# Patient Record
Sex: Female | Born: 1935
Health system: Southern US, Community
[De-identification: ages and names within clinical notes are randomized; demographics above are authoritative.]

## PROBLEM LIST (undated history)

## (undated) DIAGNOSIS — I10 Essential (primary) hypertension: Secondary | ICD-10-CM

## (undated) DIAGNOSIS — Z803 Family history of malignant neoplasm of breast: Secondary | ICD-10-CM

## (undated) DIAGNOSIS — A0472 Enterocolitis due to Clostridium difficile, not specified as recurrent: Secondary | ICD-10-CM

## (undated) DIAGNOSIS — C801 Malignant (primary) neoplasm, unspecified: Secondary | ICD-10-CM

## (undated) DIAGNOSIS — K5792 Diverticulitis of intestine, part unspecified, without perforation or abscess without bleeding: Secondary | ICD-10-CM

## (undated) DIAGNOSIS — Z5189 Encounter for other specified aftercare: Secondary | ICD-10-CM

## (undated) DIAGNOSIS — D649 Anemia, unspecified: Secondary | ICD-10-CM

## (undated) DIAGNOSIS — Z8052 Family history of malignant neoplasm of bladder: Secondary | ICD-10-CM

## (undated) DIAGNOSIS — E785 Hyperlipidemia, unspecified: Secondary | ICD-10-CM

## (undated) DIAGNOSIS — N390 Urinary tract infection, site not specified: Secondary | ICD-10-CM

## (undated) DIAGNOSIS — Z800 Family history of malignant neoplasm of digestive organs: Secondary | ICD-10-CM

## (undated) HISTORY — DX: Essential (primary) hypertension: I10

## (undated) HISTORY — DX: Diverticulitis of intestine, part unspecified, without perforation or abscess without bleeding: K57.92

## (undated) HISTORY — PX: BLADDER SUSPENSION: SHX72

## (undated) HISTORY — DX: Malignant (primary) neoplasm, unspecified: C80.1

## (undated) HISTORY — DX: Enterocolitis due to Clostridium difficile, not specified as recurrent: A04.72

## (undated) HISTORY — DX: Family history of malignant neoplasm of breast: Z80.3

## (undated) HISTORY — DX: Hyperlipidemia, unspecified: E78.5

## (undated) HISTORY — DX: Family history of malignant neoplasm of digestive organs: Z80.0

## (undated) HISTORY — DX: Anemia, unspecified: D64.9

## (undated) HISTORY — DX: Encounter for other specified aftercare: Z51.89

## (undated) HISTORY — DX: Urinary tract infection, site not specified: N39.0

## (undated) HISTORY — PX: ABDOMINAL HYSTERECTOMY: SHX81

## (undated) HISTORY — DX: Family history of malignant neoplasm of bladder: Z80.52

## (undated) HISTORY — PX: CATARACT EXTRACTION: SUR2

## (undated) HISTORY — PX: COLON SURGERY: SHX602

---

## 1999-05-31 ENCOUNTER — Other Ambulatory Visit: Admission: RE | Admit: 1999-05-31 | Discharge: 1999-05-31 | Payer: Self-pay | Admitting: Obstetrics and Gynecology

## 2000-08-14 ENCOUNTER — Other Ambulatory Visit: Admission: RE | Admit: 2000-08-14 | Discharge: 2000-08-14 | Payer: Self-pay | Admitting: Obstetrics and Gynecology

## 2002-02-07 ENCOUNTER — Encounter: Payer: Self-pay | Admitting: Urology

## 2002-02-07 ENCOUNTER — Encounter: Admission: RE | Admit: 2002-02-07 | Discharge: 2002-02-07 | Payer: Self-pay | Admitting: Urology

## 2002-02-12 ENCOUNTER — Ambulatory Visit (HOSPITAL_BASED_OUTPATIENT_CLINIC_OR_DEPARTMENT_OTHER): Admission: RE | Admit: 2002-02-12 | Discharge: 2002-02-12 | Payer: Self-pay | Admitting: Urology

## 2002-03-11 ENCOUNTER — Encounter: Payer: Self-pay | Admitting: Obstetrics and Gynecology

## 2002-03-11 ENCOUNTER — Encounter: Admission: RE | Admit: 2002-03-11 | Discharge: 2002-03-11 | Payer: Self-pay | Admitting: Obstetrics and Gynecology

## 2004-11-02 HISTORY — PX: CARDIOVASCULAR STRESS TEST: SHX262

## 2008-02-29 HISTORY — PX: COLON SURGERY: SHX602

## 2008-05-21 ENCOUNTER — Encounter: Admission: RE | Admit: 2008-05-21 | Discharge: 2008-05-21 | Payer: Self-pay | Admitting: Gastroenterology

## 2008-05-27 ENCOUNTER — Encounter (INDEPENDENT_AMBULATORY_CARE_PROVIDER_SITE_OTHER): Payer: Self-pay | Admitting: General Surgery

## 2008-05-27 ENCOUNTER — Inpatient Hospital Stay (HOSPITAL_COMMUNITY): Admission: RE | Admit: 2008-05-27 | Discharge: 2008-06-23 | Payer: Self-pay | Admitting: General Surgery

## 2008-05-27 DIAGNOSIS — Z85038 Personal history of other malignant neoplasm of large intestine: Secondary | ICD-10-CM | POA: Insufficient documentation

## 2008-07-03 ENCOUNTER — Ambulatory Visit: Payer: Self-pay | Admitting: Oncology

## 2008-07-11 LAB — CBC WITH DIFFERENTIAL/PLATELET
BASO%: 0.4 % (ref 0.0–2.0)
Eosinophils Absolute: 0.1 10*3/uL (ref 0.0–0.5)
HCT: 37.7 % (ref 34.8–46.6)
HGB: 12.7 g/dL (ref 11.6–15.9)
MCHC: 33.7 g/dL (ref 31.5–36.0)
MONO#: 0.4 10*3/uL (ref 0.1–0.9)
NEUT#: 3.3 10*3/uL (ref 1.5–6.5)
NEUT%: 56.4 % (ref 38.4–76.8)
WBC: 5.8 10*3/uL (ref 3.9–10.3)
lymph#: 2 10*3/uL (ref 0.9–3.3)

## 2008-08-18 ENCOUNTER — Encounter: Admission: RE | Admit: 2008-08-18 | Discharge: 2008-08-18 | Payer: Self-pay | Admitting: General Surgery

## 2009-01-09 ENCOUNTER — Ambulatory Visit: Payer: Self-pay | Admitting: Oncology

## 2009-01-13 LAB — CEA: CEA: 0.5 ng/mL (ref 0.0–5.0)

## 2009-09-08 ENCOUNTER — Ambulatory Visit: Payer: Self-pay | Admitting: Oncology

## 2009-09-10 LAB — CEA: CEA: 1.2 ng/mL (ref 0.0–5.0)

## 2009-10-05 ENCOUNTER — Ambulatory Visit: Payer: Self-pay | Admitting: Cardiology

## 2010-01-20 ENCOUNTER — Ambulatory Visit: Payer: Self-pay | Admitting: Oncology

## 2010-01-25 LAB — CEA: CEA: <0.5 ng/mL (ref 0.0–5.0)

## 2010-03-31 ENCOUNTER — Ambulatory Visit (INDEPENDENT_AMBULATORY_CARE_PROVIDER_SITE_OTHER): Payer: Medicare Other | Admitting: Cardiology

## 2010-03-31 DIAGNOSIS — E7800 Pure hypercholesterolemia, unspecified: Secondary | ICD-10-CM

## 2010-03-31 DIAGNOSIS — Z79899 Other long term (current) drug therapy: Secondary | ICD-10-CM

## 2010-03-31 DIAGNOSIS — R5381 Other malaise: Secondary | ICD-10-CM

## 2010-03-31 DIAGNOSIS — I119 Hypertensive heart disease without heart failure: Secondary | ICD-10-CM

## 2010-06-09 LAB — BASIC METABOLIC PANEL
BUN: 10 mg/dL (ref 6–23)
BUN: 13 mg/dL (ref 6–23)
BUN: 14 mg/dL (ref 6–23)
BUN: 17 mg/dL (ref 6–23)
BUN: 11 mg/dL (ref 6–23)
BUN: 12 mg/dL (ref 6–23)
BUN: 3 mg/dL — ABNORMAL LOW (ref 6–23)
BUN: 3 mg/dL — ABNORMAL LOW (ref 6–23)
BUN: 4 mg/dL — ABNORMAL LOW (ref 6–23)
CO2: 25 mEq/L (ref 19–32)
CO2: 26 mEq/L (ref 19–32)
CO2: 26 mEq/L (ref 19–32)
CO2: 26 mEq/L (ref 19–32)
CO2: 28 mEq/L (ref 19–32)
Calcium: 8.1 mg/dL — ABNORMAL LOW (ref 8.4–10.5)
Calcium: 8.2 mg/dL — ABNORMAL LOW (ref 8.4–10.5)
Calcium: 8.4 mg/dL (ref 8.4–10.5)
Calcium: 8.4 mg/dL (ref 8.4–10.5)
Calcium: 8.5 mg/dL (ref 8.4–10.5)
Calcium: 8.5 mg/dL (ref 8.4–10.5)
Calcium: 8 mg/dL — ABNORMAL LOW (ref 8.4–10.5)
Calcium: 8 mg/dL — ABNORMAL LOW (ref 8.4–10.5)
Calcium: 8.6 mg/dL (ref 8.4–10.5)
Calcium: 8.8 mg/dL (ref 8.4–10.5)
Chloride: 101 mEq/L (ref 96–112)
Chloride: 105 mEq/L (ref 96–112)
Chloride: 100 mEq/L (ref 96–112)
Chloride: 100 mEq/L (ref 96–112)
Chloride: 100 mEq/L (ref 96–112)
Chloride: 102 mEq/L (ref 96–112)
Creatinine, Ser: 0.55 mg/dL (ref 0.4–1.2)
Creatinine, Ser: 0.56 mg/dL (ref 0.4–1.2)
Creatinine, Ser: 0.6 mg/dL (ref 0.4–1.2)
Creatinine, Ser: 0.64 mg/dL (ref 0.4–1.2)
Creatinine, Ser: 0.56 mg/dL (ref 0.4–1.2)
Creatinine, Ser: 0.57 mg/dL (ref 0.4–1.2)
Creatinine, Ser: 0.57 mg/dL (ref 0.4–1.2)
Creatinine, Ser: 0.6 mg/dL (ref 0.4–1.2)
Creatinine, Ser: 0.65 mg/dL (ref 0.4–1.2)
GFR calc Af Amer: >60 mL/min (ref >60)
GFR calc Af Amer: >60 mL/min (ref >60)
GFR calc Af Amer: >60 mL/min (ref >60)
GFR calc Af Amer: >60 mL/min (ref >60)
GFR calc Af Amer: >60 mL/min (ref >60)
GFR calc Af Amer: >60 mL/min (ref >60)
GFR calc Af Amer: >60 mL/min (ref >60)
GFR calc Af Amer: >60 mL/min (ref >60)
GFR calc Af Amer: >60 mL/min (ref >60)
GFR calc non Af Amer: >60 mL/min (ref >60)
GFR calc non Af Amer: >60 mL/min (ref >60)
GFR calc non Af Amer: >60 mL/min (ref >60)
GFR calc non Af Amer: >60 mL/min (ref >60)
GFR calc non Af Amer: >60 mL/min (ref >60)
GFR calc non Af Amer: >60 mL/min (ref >60)
Glucose, Bld: 109 mg/dL — ABNORMAL HIGH (ref 70–99)
Glucose, Bld: 109 mg/dL — ABNORMAL HIGH (ref 70–99)
Glucose, Bld: 125 mg/dL — ABNORMAL HIGH (ref 70–99)
Glucose, Bld: 84 mg/dL (ref 70–99)
Glucose, Bld: 135 mg/dL — ABNORMAL HIGH (ref 70–99)
Glucose, Bld: 98 mg/dL (ref 70–99)
Potassium: 4 mEq/L (ref 3.5–5.1)
Potassium: 3.7 mEq/L (ref 3.5–5.1)
Sodium: 133 mEq/L — ABNORMAL LOW (ref 135–145)
Sodium: 134 mEq/L — ABNORMAL LOW (ref 135–145)
Sodium: 135 mEq/L (ref 135–145)
Sodium: 134 mEq/L — ABNORMAL LOW (ref 135–145)
Sodium: 136 mEq/L (ref 135–145)
Sodium: 136 mEq/L (ref 135–145)

## 2010-06-09 LAB — CBC
HCT: 26.6 % — ABNORMAL LOW (ref 36.0–46.0)
HCT: 27.5 % — ABNORMAL LOW (ref 36.0–46.0)
HCT: 31.8 % — ABNORMAL LOW (ref 36.0–46.0)
HCT: 37.9 % (ref 36.0–46.0)
HCT: 38.5 % (ref 36.0–46.0)
Hemoglobin: 9.2 g/dL — ABNORMAL LOW (ref 12.0–15.0)
Hemoglobin: 9.4 g/dL — ABNORMAL LOW (ref 12.0–15.0)
Hemoglobin: 11.1 g/dL — ABNORMAL LOW (ref 12.0–15.0)
Hemoglobin: 12.3 g/dL (ref 12.0–15.0)
Hemoglobin: 8 g/dL — ABNORMAL LOW (ref 12.0–15.0)
MCHC: 33.5 g/dL (ref 30.0–36.0)
MCHC: 33.6 g/dL (ref 30.0–36.0)
MCHC: 34.2 g/dL (ref 30.0–36.0)
MCHC: 34.4 g/dL (ref 30.0–36.0)
MCHC: 34.6 g/dL (ref 30.0–36.0)
MCHC: 33 g/dL (ref 30.0–36.0)
MCHC: 33.1 g/dL (ref 30.0–36.0)
MCHC: 33.3 g/dL (ref 30.0–36.0)
MCHC: 34 g/dL (ref 30.0–36.0)
MCV: 79.6 fL (ref 78.0–100.0)
MCV: 79.8 fL (ref 78.0–100.0)
MCV: 80 fL (ref 78.0–100.0)
MCV: 80.6 fL (ref 78.0–100.0)
MCV: 72.5 fL — ABNORMAL LOW (ref 78.0–100.0)
MCV: 76.8 fL — ABNORMAL LOW (ref 78.0–100.0)
MCV: 77.2 fL — ABNORMAL LOW (ref 78.0–100.0)
MCV: 78.2 fL (ref 78.0–100.0)
Platelets: 275 10*3/uL (ref 150–400)
Platelets: 283 10*3/uL (ref 150–400)
Platelets: 297 10*3/uL (ref 150–400)
Platelets: 316 10*3/uL (ref 150–400)
Platelets: 209 10*3/uL (ref 150–400)
Platelets: 240 10*3/uL (ref 150–400)
Platelets: 298 10*3/uL (ref 150–400)
Platelets: 321 10*3/uL (ref 150–400)
Platelets: 334 10*3/uL (ref 150–400)
Platelets: 339 10*3/uL (ref 150–400)
Platelets: 377 10*3/uL (ref 150–400)
RBC: 3.23 MIL/uL — ABNORMAL LOW (ref 3.87–5.11)
RBC: 3.53 MIL/uL — ABNORMAL LOW (ref 3.87–5.11)
RBC: 3.78 MIL/uL — ABNORMAL LOW (ref 3.87–5.11)
RBC: 3.07 MIL/uL — ABNORMAL LOW (ref 3.87–5.11)
RBC: 3.13 MIL/uL — ABNORMAL LOW (ref 3.87–5.11)
RBC: 4.36 MIL/uL (ref 3.87–5.11)
RBC: 4.6 MIL/uL (ref 3.87–5.11)
RBC: 4.91 MIL/uL (ref 3.87–5.11)
RDW: 24.4 % — ABNORMAL HIGH (ref 11.5–15.5)
RDW: 25 % — ABNORMAL HIGH (ref 11.5–15.5)
RDW: 25 % — ABNORMAL HIGH (ref 11.5–15.5)
RDW: 25 % — ABNORMAL HIGH (ref 11.5–15.5)
RDW: 25.3 % — ABNORMAL HIGH (ref 11.5–15.5)
RDW: 24.4 % — ABNORMAL HIGH (ref 11.5–15.5)
RDW: 25.2 % — ABNORMAL HIGH (ref 11.5–15.5)
WBC: 10.9 10*3/uL — ABNORMAL HIGH (ref 4.0–10.5)
WBC: 7.2 10*3/uL (ref 4.0–10.5)
WBC: 7.7 10*3/uL (ref 4.0–10.5)
WBC: 13.3 10*3/uL — ABNORMAL HIGH (ref 4.0–10.5)
WBC: 5.3 10*3/uL (ref 4.0–10.5)
WBC: 6.7 10*3/uL (ref 4.0–10.5)
WBC: 6.9 10*3/uL (ref 4.0–10.5)
WBC: 7 10*3/uL (ref 4.0–10.5)
WBC: 7.1 10*3/uL (ref 4.0–10.5)
WBC: 7.2 10*3/uL (ref 4.0–10.5)
WBC: 7.4 10*3/uL (ref 4.0–10.5)

## 2010-06-09 LAB — COMPREHENSIVE METABOLIC PANEL
ALT: 14 U/L (ref 0–35)
ALT: 14 U/L (ref 0–35)
ALT: 9 U/L (ref 0–35)
AST: 17 U/L (ref 0–37)
AST: 17 U/L (ref 0–37)
AST: 19 U/L (ref 0–37)
AST: 9 U/L (ref 0–37)
Albumin: 2.1 g/dL — ABNORMAL LOW (ref 3.5–5.2)
Albumin: 2.3 g/dL — ABNORMAL LOW (ref 3.5–5.2)
Albumin: 2.5 g/dL — ABNORMAL LOW (ref 3.5–5.2)
Albumin: 1.6 g/dL — ABNORMAL LOW (ref 3.5–5.2)
Albumin: 2.4 g/dL — ABNORMAL LOW (ref 3.5–5.2)
Alkaline Phosphatase: 126 U/L — ABNORMAL HIGH (ref 39–117)
Alkaline Phosphatase: 61 U/L (ref 39–117)
BUN: 12 mg/dL (ref 6–23)
BUN: 14 mg/dL (ref 6–23)
CO2: 23 mEq/L (ref 19–32)
Calcium: 8.3 mg/dL — ABNORMAL LOW (ref 8.4–10.5)
Calcium: 8.5 mg/dL (ref 8.4–10.5)
Calcium: 8.1 mg/dL — ABNORMAL LOW (ref 8.4–10.5)
Calcium: 8.6 mg/dL (ref 8.4–10.5)
Calcium: 8.6 mg/dL (ref 8.4–10.5)
Chloride: 104 mEq/L (ref 96–112)
Chloride: 106 mEq/L (ref 96–112)
Creatinine, Ser: 0.55 mg/dL (ref 0.4–1.2)
Creatinine, Ser: 0.59 mg/dL (ref 0.4–1.2)
Creatinine, Ser: 0.47 mg/dL (ref 0.4–1.2)
Creatinine, Ser: 0.51 mg/dL (ref 0.4–1.2)
GFR calc Af Amer: >60 mL/min (ref >60)
GFR calc Af Amer: >60 mL/min (ref >60)
GFR calc Af Amer: >60 mL/min (ref >60)
GFR calc Af Amer: >60 mL/min (ref >60)
GFR calc non Af Amer: >60 mL/min (ref >60)
Glucose, Bld: 109 mg/dL — ABNORMAL HIGH (ref 70–99)
Glucose, Bld: 116 mg/dL — ABNORMAL HIGH (ref 70–99)
Glucose, Bld: 118 mg/dL — ABNORMAL HIGH (ref 70–99)
Potassium: 3.9 mEq/L (ref 3.5–5.1)
Potassium: 4.1 mEq/L (ref 3.5–5.1)
Sodium: 133 mEq/L — ABNORMAL LOW (ref 135–145)
Sodium: 133 mEq/L — ABNORMAL LOW (ref 135–145)
Sodium: 137 mEq/L (ref 135–145)
Total Bilirubin: 0.4 mg/dL (ref 0.3–1.2)
Total Protein: 5.3 g/dL — ABNORMAL LOW (ref 6.0–8.3)
Total Protein: 5.3 g/dL — ABNORMAL LOW (ref 6.0–8.3)
Total Protein: 4.5 g/dL — ABNORMAL LOW (ref 6.0–8.3)
Total Protein: 4.7 g/dL — ABNORMAL LOW (ref 6.0–8.3)

## 2010-06-09 LAB — GLUCOSE, CAPILLARY
Glucose-Capillary: 103 mg/dL — ABNORMAL HIGH (ref 70–99)
Glucose-Capillary: 105 mg/dL — ABNORMAL HIGH (ref 70–99)
Glucose-Capillary: 108 mg/dL — ABNORMAL HIGH (ref 70–99)
Glucose-Capillary: 109 mg/dL — ABNORMAL HIGH (ref 70–99)
Glucose-Capillary: 111 mg/dL — ABNORMAL HIGH (ref 70–99)
Glucose-Capillary: 113 mg/dL — ABNORMAL HIGH (ref 70–99)
Glucose-Capillary: 115 mg/dL — ABNORMAL HIGH (ref 70–99)
Glucose-Capillary: 115 mg/dL — ABNORMAL HIGH (ref 70–99)
Glucose-Capillary: 117 mg/dL — ABNORMAL HIGH (ref 70–99)
Glucose-Capillary: 118 mg/dL — ABNORMAL HIGH (ref 70–99)
Glucose-Capillary: 120 mg/dL — ABNORMAL HIGH (ref 70–99)
Glucose-Capillary: 123 mg/dL — ABNORMAL HIGH (ref 70–99)
Glucose-Capillary: 126 mg/dL — ABNORMAL HIGH (ref 70–99)
Glucose-Capillary: 127 mg/dL — ABNORMAL HIGH (ref 70–99)
Glucose-Capillary: 130 mg/dL — ABNORMAL HIGH (ref 70–99)
Glucose-Capillary: 130 mg/dL — ABNORMAL HIGH (ref 70–99)
Glucose-Capillary: 130 mg/dL — ABNORMAL HIGH (ref 70–99)
Glucose-Capillary: 131 mg/dL — ABNORMAL HIGH (ref 70–99)
Glucose-Capillary: 132 mg/dL — ABNORMAL HIGH (ref 70–99)
Glucose-Capillary: 132 mg/dL — ABNORMAL HIGH (ref 70–99)
Glucose-Capillary: 134 mg/dL — ABNORMAL HIGH (ref 70–99)
Glucose-Capillary: 136 mg/dL — ABNORMAL HIGH (ref 70–99)
Glucose-Capillary: 138 mg/dL — ABNORMAL HIGH (ref 70–99)
Glucose-Capillary: 139 mg/dL — ABNORMAL HIGH (ref 70–99)
Glucose-Capillary: 141 mg/dL — ABNORMAL HIGH (ref 70–99)
Glucose-Capillary: 155 mg/dL — ABNORMAL HIGH (ref 70–99)
Glucose-Capillary: 112 mg/dL — ABNORMAL HIGH (ref 70–99)
Glucose-Capillary: 113 mg/dL — ABNORMAL HIGH (ref 70–99)
Glucose-Capillary: 113 mg/dL — ABNORMAL HIGH (ref 70–99)
Glucose-Capillary: 118 mg/dL — ABNORMAL HIGH (ref 70–99)
Glucose-Capillary: 119 mg/dL — ABNORMAL HIGH (ref 70–99)
Glucose-Capillary: 119 mg/dL — ABNORMAL HIGH (ref 70–99)
Glucose-Capillary: 121 mg/dL — ABNORMAL HIGH (ref 70–99)
Glucose-Capillary: 124 mg/dL — ABNORMAL HIGH (ref 70–99)
Glucose-Capillary: 131 mg/dL — ABNORMAL HIGH (ref 70–99)
Glucose-Capillary: 131 mg/dL — ABNORMAL HIGH (ref 70–99)
Glucose-Capillary: 137 mg/dL — ABNORMAL HIGH (ref 70–99)
Glucose-Capillary: 138 mg/dL — ABNORMAL HIGH (ref 70–99)
Glucose-Capillary: 145 mg/dL — ABNORMAL HIGH (ref 70–99)
Glucose-Capillary: 150 mg/dL — ABNORMAL HIGH (ref 70–99)
Glucose-Capillary: 155 mg/dL — ABNORMAL HIGH (ref 70–99)
Glucose-Capillary: 157 mg/dL — ABNORMAL HIGH (ref 70–99)
Glucose-Capillary: 165 mg/dL — ABNORMAL HIGH (ref 70–99)
Glucose-Capillary: 172 mg/dL — ABNORMAL HIGH (ref 70–99)
Glucose-Capillary: 220 mg/dL — ABNORMAL HIGH (ref 70–99)
Glucose-Capillary: 99 mg/dL (ref 70–99)

## 2010-06-09 LAB — DIFFERENTIAL
Basophils Relative: 1 % (ref 0–1)
Basophils Relative: 1 % (ref 0–1)
Eosinophils Absolute: 0.3 10*3/uL (ref 0.0–0.7)
Eosinophils Relative: 3 % (ref 0–5)
Eosinophils Relative: 3 % (ref 0–5)
Eosinophils Relative: 3 % (ref 0–5)
Eosinophils Relative: 4 % (ref 0–5)
Lymphocytes Relative: 25 % (ref 12–46)
Lymphocytes Relative: 30 % (ref 12–46)
Lymphs Abs: 0.7 10*3/uL (ref 0.7–4.0)
Lymphs Abs: 1.8 10*3/uL (ref 0.7–4.0)
Lymphs Abs: 1 10*3/uL (ref 0.7–4.0)
Monocytes Absolute: 0.5 10*3/uL (ref 0.1–1.0)
Monocytes Absolute: 0.6 10*3/uL (ref 0.1–1.0)
Monocytes Absolute: 0.6 10*3/uL (ref 0.1–1.0)
Monocytes Relative: 8 % (ref 3–12)
Monocytes Relative: 8 % (ref 3–12)
Neutro Abs: 3.8 10*3/uL (ref 1.7–7.7)
Neutro Abs: 5.2 10*3/uL (ref 1.7–7.7)
Neutrophils Relative %: 58 % (ref 43–77)

## 2010-06-09 LAB — CHOLESTEROL, TOTAL
Cholesterol: 131 mg/dL (ref 0–200)
Cholesterol: 98 mg/dL (ref 0–200)
Cholesterol: 86 mg/dL (ref 0–200)
Cholesterol: 87 mg/dL (ref 0–200)

## 2010-06-09 LAB — URINALYSIS, ROUTINE W REFLEX MICROSCOPIC
Bilirubin Urine: NEGATIVE
Glucose, UA: NEGATIVE mg/dL
Glucose, UA: NEGATIVE mg/dL
Hgb urine dipstick: NEGATIVE
Hgb urine dipstick: NEGATIVE
Protein, ur: NEGATIVE mg/dL
Specific Gravity, Urine: 1.009 (ref 1.005–1.030)
Specific Gravity, Urine: 1.023 (ref 1.005–1.030)
Urobilinogen, UA: 0.2 mg/dL (ref 0.0–1.0)
Urobilinogen, UA: 2 mg/dL — ABNORMAL HIGH (ref 0.0–1.0)

## 2010-06-09 LAB — PHOSPHORUS
Phosphorus: 3.8 mg/dL (ref 2.3–4.6)
Phosphorus: 3.1 mg/dL (ref 2.3–4.6)
Phosphorus: 4.4 mg/dL (ref 2.3–4.6)
Phosphorus: 4.4 mg/dL (ref 2.3–4.6)

## 2010-06-09 LAB — CROSSMATCH: Antibody Screen: NEGATIVE

## 2010-06-09 LAB — TRIGLYCERIDES
Triglycerides: 82 mg/dL (ref <150)
Triglycerides: 86 mg/dL (ref <150)
Triglycerides: 80 mg/dL (ref <150)
Triglycerides: 95 mg/dL (ref <150)

## 2010-06-09 LAB — MAGNESIUM
Magnesium: 1.7 mg/dL (ref 1.5–2.5)
Magnesium: 1.8 mg/dL (ref 1.5–2.5)
Magnesium: 1.8 mg/dL (ref 1.5–2.5)
Magnesium: 2 mg/dL (ref 1.5–2.5)

## 2010-06-09 LAB — URINE CULTURE

## 2010-06-09 LAB — CLOSTRIDIUM DIFFICILE EIA: C difficile Toxins A+B, EIA: 20

## 2010-06-09 LAB — URINE MICROSCOPIC-ADD ON

## 2010-06-09 LAB — PREALBUMIN: Prealbumin: 12.6 mg/dL — ABNORMAL LOW (ref 18.0–45.0)

## 2010-06-10 LAB — DIFFERENTIAL
Basophils Absolute: 0.1 10*3/uL (ref 0.0–0.1)
Eosinophils Absolute: 0.1 10*3/uL (ref 0.0–0.7)
Lymphocytes Relative: 26 % (ref 12–46)
Lymphs Abs: 1.8 10*3/uL (ref 0.7–4.0)
Neutrophils Relative %: 64 % (ref 43–77)

## 2010-06-10 LAB — TYPE AND SCREEN: Antibody Screen: NEGATIVE

## 2010-06-10 LAB — COMPREHENSIVE METABOLIC PANEL
Alkaline Phosphatase: 79 U/L (ref 39–117)
BUN: 8 mg/dL (ref 6–23)
CO2: 24 mEq/L (ref 19–32)
Chloride: 105 mEq/L (ref 96–112)
GFR calc non Af Amer: >60 mL/min (ref >60)
Glucose, Bld: 105 mg/dL — ABNORMAL HIGH (ref 70–99)
Potassium: 3.5 mEq/L (ref 3.5–5.1)
Total Bilirubin: 0.4 mg/dL (ref 0.3–1.2)

## 2010-06-10 LAB — CBC
HCT: 24.5 % — ABNORMAL LOW (ref 36.0–46.0)
MCHC: 32.2 g/dL (ref 30.0–36.0)
MCV: 68 fL — ABNORMAL LOW (ref 78.0–100.0)
Platelets: 354 10*3/uL (ref 150–400)
Platelets: 474 10*3/uL — ABNORMAL HIGH (ref 150–400)
RBC: 3.53 MIL/uL — ABNORMAL LOW (ref 3.87–5.11)
RBC: 3.6 MIL/uL — ABNORMAL LOW (ref 3.87–5.11)
RDW: 23.9 % — ABNORMAL HIGH (ref 11.5–15.5)
WBC: 6.8 10*3/uL (ref 4.0–10.5)

## 2010-06-10 LAB — BASIC METABOLIC PANEL
BUN: 6 mg/dL (ref 6–23)
CO2: 27 mEq/L (ref 19–32)
Calcium: 7.8 mg/dL — ABNORMAL LOW (ref 8.4–10.5)
Creatinine, Ser: 0.64 mg/dL (ref 0.4–1.2)
GFR calc Af Amer: >60 mL/min (ref >60)

## 2010-06-10 LAB — ABO/RH: ABO/RH(D): O NEG

## 2010-06-22 ENCOUNTER — Ambulatory Visit (INDEPENDENT_AMBULATORY_CARE_PROVIDER_SITE_OTHER): Payer: Medicare Other | Admitting: Family Medicine

## 2010-06-22 ENCOUNTER — Encounter: Payer: Self-pay | Admitting: Family Medicine

## 2010-06-22 DIAGNOSIS — Z23 Encounter for immunization: Secondary | ICD-10-CM

## 2010-06-22 DIAGNOSIS — M2669 Other specified disorders of temporomandibular joint: Secondary | ICD-10-CM

## 2010-06-22 DIAGNOSIS — I10 Essential (primary) hypertension: Secondary | ICD-10-CM | POA: Insufficient documentation

## 2010-06-22 DIAGNOSIS — R29898 Other symptoms and signs involving the musculoskeletal system: Secondary | ICD-10-CM

## 2010-06-22 DIAGNOSIS — Z0000 Encounter for general adult medical examination without abnormal findings: Secondary | ICD-10-CM

## 2010-06-22 DIAGNOSIS — Z85038 Personal history of other malignant neoplasm of large intestine: Secondary | ICD-10-CM

## 2010-06-22 DIAGNOSIS — Z9849 Cataract extraction status, unspecified eye: Secondary | ICD-10-CM

## 2010-06-22 DIAGNOSIS — Z8619 Personal history of other infectious and parasitic diseases: Secondary | ICD-10-CM

## 2010-06-22 DIAGNOSIS — M549 Dorsalgia, unspecified: Secondary | ICD-10-CM

## 2010-06-22 DIAGNOSIS — E7800 Pure hypercholesterolemia, unspecified: Secondary | ICD-10-CM | POA: Insufficient documentation

## 2010-06-22 DIAGNOSIS — N393 Stress incontinence (female) (male): Secondary | ICD-10-CM

## 2010-06-22 MED ORDER — TETANUS-DIPHTHERIA TOXOIDS TD 2-2 LF/0.5ML IM SUSP: 0.5000 mL | Freq: Once | INTRAMUSCULAR | Status: DC

## 2010-06-22 NOTE — Progress Notes (Signed)
New pt to est care.    Requesting records.    Hypertension:    Using medication without problems or lightheadedness: yes Chest pain with exertion:no Edema:no Short of breath:no Average home BPs: usually lower than today, often 130-140/80-90 Other issues:see below  Elevated Cholesterol: Using medications without problems: yes Muscle aches: no, on lovaza Other complaints:see below  R side pain.  Pain bending over.  Started after digging in the yard. Going on for about a few weeks.  Getting some better.  No dysuria.  No F, C, weakness.  Better after getting out of bed and moving some.   L TMJ with audible clicking.  She was prev having trouble opening jaw and had some pain, but this is getting better.    PMH and SH reviewed  Meds, vitals, and allergies reviewed.   ROS: See HPI.  Otherwise negative.    GEN: nad, alert and oriented HEENT: mucous membranes moist, tm wnl, nasal and oral exam wnl except for click on L TMJ, mildly ttp NECK: supple w/o LA CV: rrr. PULM: ctab, no inc wob ABD: soft, +bs EXT: no edema SKIN: no acute rash, Lipoma that isn't tender noted on R forearm.   R midaxillary line minimally ttp along the oblique, no bruising

## 2010-06-22 NOTE — Patient Instructions (Addendum)
Don't change your meds.   Check with your insurance to see if they will cover the shingles shot.  Check with Dr. Yolanda Bonine about your bone density scan. Let me know if you need help getting this ordered.   Your back pain should gradually get better.   Take care.  Glad to see you today.

## 2010-06-23 ENCOUNTER — Encounter: Payer: Self-pay | Admitting: Family Medicine

## 2010-06-23 NOTE — Assessment & Plan Note (Signed)
No change in meds.  Requesting records.

## 2010-06-23 NOTE — Assessment & Plan Note (Signed)
D/w pt re:jaw rest and ice.  Fu prn.

## 2010-06-23 NOTE — Assessment & Plan Note (Signed)
No midline back pain. Resolving, likely oblique strain.  Fu prn.  No red flag symptoms or findings.

## 2010-06-23 NOTE — Assessment & Plan Note (Signed)
No change in meds.  Requesting records.   

## 2010-06-23 NOTE — Assessment & Plan Note (Signed)
Per oncology, requesting records.

## 2010-07-06 ENCOUNTER — Encounter: Payer: Self-pay | Admitting: Family Medicine

## 2010-07-13 NOTE — Op Note (Signed)
NAMELINNA, Amber [name] NO.:  [id number]   MEDICAL RECORD NO.:  [id number]          PATIENT TYPE:  INP   LOCATION:  5127                         FACILITY:  [facility]   PHYSICIAN:  Juanetta Gosling, MDDATE OF BIRTH:  [birth year]   DATE OF PROCEDURE:  05/27/2008  DATE OF DISCHARGE:                               OPERATIVE REPORT   PREOPERATIVE DIAGNOSIS:  Right colon cancer.   POSTOPERATIVE DIAGNOSIS:  Right colon cancer.   PROCEDURES:  1. Open right hemicolectomy.  2. Biopsy, right liver lesion.   SURGEON:  Troy Sine. Dwain Sarna, MD   ASSISTANT:  P.J. Carolynne Edouard.   ANESTHESIA:  General.   FINDINGS:  A large proximal transverse colon lesion with no gross  evidence of any invasion.   SPECIMENS:  1. Right colon .  2. Liver biopsy   ESTIMATED BLOOD LOSS:  Minimal.   COMPLICATIONS:  None.   DRAINS:  None.   DISPOSITION:  To recovery room in stable condition.   SUPERVISING ANESTHESIOLOGIST:  Guadalupe Maple, MD   INDICATIONS:  Ms. Sarria is a 77 year old female, who has a family  history of colon cancer, and is a patient of Dr. Patty Sermons and Dr.  Randa Evens.  She recently presented with lightheadedness, dizziness, and  fainting.  She was found to have a hemoglobin in the 6 and with some  vague and crampy abdominal pain.  Dr. Randa Evens performed a colonoscopy in  her with a finding of a fungating partially obstructing mass, which  appeared to be in the proximal descending colon.  The biopsy showed this  to be fibrinopurulent material.  Subsequently, she underwent a CT scan,  which showed a small hypodense structure  in the inferior right hepatic  lobe as well as the posterior right hepatic lobe, but both were too  small to characterize.  She also had what appeared to be a proximal  transverse colon mass as well, area extended over about 9 cm on her CT  scan.  I discussed with her laparoscopic or possible open colectomy.  Due to her profound anemia before this, I did  administer her 2 units of  blood preoperatively due to her anemia that was preexisting.   PROCEDURE:  After informed consent was obtained, the patient was taken  to the operating room.  She was administered 1 g of intravenous Invanz.  Sequential compression devices were placed on the lower extremities  prior to induction.  She was placed under general anesthesia without  complication.  Her Foley catheter was then placed.  Her abdomen was then  prepped with ChloraPrep, appropriate time passed before, we then draped  her in a standard sterile surgical fashion.  A surgical time-out was  then performed.   After she had gone to sleep, there was a palpable very large right upper  quadrant mass.  Due to the CT finding as well as the size of this mass,  when I was examining her while she was asleep, I did elect to perform an  open right hemicolectomy.  This area that it was about 10-12 cm in total  and when  I examined her, a midline incision was then made from below the  xiphoid to a couple of centimeters below the umbilicus.  Dissection was  carried out down to the fascia.  Her peritoneum was then entered without  difficulty.  There were some adhesions.  There was a small umbilical  hernia as well as some adhesions from prior hysterectomy with the  omentum stuck down in the pelvis.  These were taken down with a  combination of electrocautery and silk ties when the omentum was  divided.  Following this, she was noted to have a very large proximal  transverse colon mass.  This area measured about 10 or 11 cm.  It was  not really attached in the surrounding structures except for the  overlying omentum.  The colon was then medialized after taking down the  white line of Toldt.  The ureter was identified and preserved throughout  this portion of the operation.  The duodenum was also identified and the  colon was removed from the duodenum.  A position in the transverse colon  just proximal to one of  the middle colic branches was then chosen for  the distal transection point, a transection point on the terminal ileum  was also identified, and a GIA stapler was used to come across both of  these.  The LigaSure device was then used to divide the mesentery.  All  main vessels were stick tied with a 2-0 silk as well as a tie of 2-0  silk on top of that.  A specimen was then removed and passed off the  table.  Hemostasis was observed.  Irrigation was performed.  The small  bowel was then brought to the transverse colon.  These were tacked  together with a 3-0 silk in 2 positions.  Towels were then laid out.  An  enterotomy was made in both the small and large intestine, and the GIA  stapler was then inserted.  This was fired and created a good common  enterotomy.  There was no evidence of bleeding.  The common enterotomy  was then pulled up with Allis clamps.  This was then closed with a TA  stapler.  The staple line was then oversewn with 3-0 silk sutures.  This  was patent upon completion.  I then used a 3-0 Vicryl to close the  mesenteric defect.  The anastomosis looked well.  The omentum was then  placed over the anastomosis.  The entire small bowel was also run with  no evidence of any abnormalities as well as the remainder of the colon.  The left lobe of the liver showed no abnormalities.  On the dome of the  right liver, there was what appeared to be a hemangioma.  There was also  a small white surface lesion on the liver that was biopsied with  electrocautery.  Surgicel was placed into this biopsy site upon  completion of this.  Again, more irrigation was performed.  Hemostasis  was observed.  I then closed the abdomen with a #1 looped PDS.  This was  then tied in the middle in 2 separate knots.  The wound was then  irrigated again.  The skin was then stapled shut.  Sterile dressing was  then placed over this.  She tolerated this procedure well and was  extubated in the operating  room, and transferred to the recovery room in  stable condition.      Juanetta Gosling, MD  Electronically Signed  MCW/MEDQ  D:  05/27/2008  T:  05/28/2008  Job:  [id number]   cc:   Cassell Clement, M.D.  James L. Malon Kindle., M.D.

## 2010-07-13 NOTE — Op Note (Signed)
[name], [name] NO.:  [id number]   MEDICAL RECORD NO.:  [id number]          PATIENT TYPE:  INP   LOCATION:  5148                         FACILITY:  [facility]   PHYSICIAN:  Juanetta Gosling, MDDATE OF BIRTH:  Apr 10, 1933   DATE OF PROCEDURE:  06/06/2008  DATE OF DISCHARGE:                               OPERATIVE REPORT   PREOPERATIVE DIAGNOSES:  1. Status post right colectomy for T3 N0 colon cancer, she is      postoperative day 10.  2. Postoperative partial bowel obstruction with possible internal      hernia.   POSTOPERATIVE DIAGNOSES:  1. Status post right colectomy for T3 N0 colon cancer, she is      postoperative day 10.  2. Early partial bowel obstruction with kinking of her duodenum from      extensive adhesions.   PROCEDURES:  1. Exploratory laparotomy.  2. Lysis of adhesions.   SURGEON:  Troy Sine. Dwain Sarna, MD   ASSISTANT:  Currie Paris, MD   ANESTHESIA:  General.   FINDINGS:  Extensive adhesions with kinking of duodenum that was given  her an intermittent partial bowel obstruction.  Even though her contrast  passed through to her rectum, I think this was the source of her nausea.   SPECIMENS:  None.   DRAINS:  None.   COMPLICATIONS:  None.   ESTIMATED BLOOD LOSS:  Minimal.   DISPOSITION:  To recovery room in stable condition.   HISTORY:  Amber [name] is a 77 year old female, who with a family history  of colon cancer, recently was found to have a large of right upper  quadrant colon cancer.  I took her to the operating room on May 27, 2008, and performed an open right hemicolectomy and biopsy of right  liver lesion.  Her pathology returned as a T3 N0 colon cancer, and the  liver lesion was negative.  She had done well until about postoperative  day 4, when she began having some emesis.  This was intermittent over  the next several days.  She was passing gas, having bowel movements,  this ended up continuing in her white  blood cell count and temperature  as well.  The rest of her vital signs were normal throughout this.  Due  to my concern of her persistent nausea, I did get a CT scan last night  on her that showed what I thought was an internal hernia with her  duodenum been present mostly on the right side of her abdomen and it  appeared kinked even though contrast flowed through to her rectum, she  was actually having bowel movements this morning.  She and I discussed  that this was likely the cause of her symptoms and I am told that this  merited reexploration.   PROCEDURE:  After informed consent was obtained, from both the patient  and discussion with her husband, she was then taken to the operating  room.  Sequential compression devices were placed on her lower  extremities prior to induction.  Intravenous cefazolin 1 g was  administered to her.  She then underwent general endotracheal anesthesia  without complication, and nasogastric tube as well as a Foley catheter  were then placed.  Her abdomen was then prepped and draped in standard  sterile surgical fashion.  Surgical time-out was then performed.   Her prior staples were removed.  Her prior midline incisions were then  entered.  The previous PDS suture was cut and her abdomen was entered  fairly easily.  I had laid the omentum underneath the incision before  and this very easily came off her incision.  Following this, she did not  really have any dilated bowel except for the most proximal portion of  her duodenum.  We were able to take her omentum off her anastomosis.  Anastomosis looked very healthy without any evidence of any leakage and  her proximal duodenum did look dilated and was initially thought that  she might have an internal hernia.  After we were able to dissect  numerous adhesions that she had eventually we were able to determine  that the duodenum had just been kinked.  The duodenum had been pulled  over to the right side of  the abdomen was then kinked by an adhesion.  She did not really have a very substantial ligament of Treitz and this  had just  stretched and pulled over to the right side of her abdomen  resulting in intermittent kinking of her duodenum.  This was all freed  and the duodenum was returned to its normal position.  This was very  adherent to the anastomosis and in the region where the inflammation  from her cancer had been as well and I think this contributed to this.  I did replace her duodenum in the correct position and tacked this with  several 3-0 silks back into its normal position.  Following this, I did  put one more 3-0 silk into the small bowel and colon proximal to the  anastomosis who made previously to make this lie and what appeared to be  a better position.  Though all of the bowel was viable and was no  evidence of any infection when we reentered her.  Following this, we  irrigated, there hemostasis was observed, I did place some Tisseel over  the staple line of our prior anastomosis as we had dissected around  this, then laid the omentum overlying this.  There was a tongue of  omentum that I had used previously to lay down the pelvis.  Due to my  concern of something rotated on this, I did remove this as well.  Again,  there was no internal hernia that was present with this and the  anastomosis was completely viable.  The malleable was then placed in the  abdomen.  Her fascia was then closed with a #1 loop PDS.  I put 0  Prolene interrupted sutures about every 3 cm through her fascia as well.  These sutures were then tied.  The wound was then irrigated copiously.  I closed her skin with staples and placed a sterile dressing over this.  She tolerated this well and was transferred to the recovery room in  stable condition.      Juanetta Gosling, MD  Electronically Signed     MCW/MEDQ  D:  06/06/2008  T:  06/07/2008  Job:  [id number]   cc:   Amber [name]., M.D.

## 2010-07-16 NOTE — Discharge Summary (Signed)
Amber [name], Amber [name] NO.:  [id number]   MEDICAL RECORD NO.:  [id number]          PATIENT TYPE:  INP   LOCATION:  5148                         FACILITY:  [facility]   PHYSICIAN:  Juanetta Gosling, MDDATE OF BIRTH:  05/31/1933   DATE OF ADMISSION:  05/27/2008  DATE OF DISCHARGE:  06/23/2008                               DISCHARGE SUMMARY   ADMISSION DIAGNOSES:  1. Malignant neoplasm, transverse colon.  2. Hypertension.  3. Anemia of chronic neoplastic disease.  4. Gastroesophageal reflux disease.   DISCHARGE DIAGNOSES:  1. Malignant neoplasm, transverse colon.  2. Hypertension.  3. Anemia of chronic neoplastic disease.  4. Gastroesophageal reflux disease.   PROCEDURES:  1. Open right hemicolectomy on May 27, 2008, with biopsy of a liver      lesion.  2. On June 06, 2008, exploratory laparotomy with lysis of adhesions.   CONSULTANTS:  None.   MEDICATIONS UPON DISCHARGE:  1. Percocet as needed for pain.  2. Flagyl as directed.  3. Omeprazole 20 mg b.i.d.   Follow up was with [facility], Dr. Dwain Sarna in 1 week.   HISTORY:  Amber [name] is a 76 year old female with a family history of  colon cancer and negative colonoscopy in 2005 who underwent some  laboratory evaluation and was found to have a hemoglobin in the 6 with  blood in her stool.  She also had some vague crampy abdominal pain.  She  was then seen by Dr. Carman Ching who performed a colonoscopy on her  with a finding of a fungating partially obstructing mass located what  appeared to be in the proximal descending colon.  It was able to be  passed.  Biopsies were taken at that time which returned as fiber  purulent material.  She underwent a CT scan, which showed a small  hypodense structure in the inferior right hepatic lobe that was too  small to characterize as well as a proximal transverse colon mass  extending to about 9 cm on her CT scan.  I saw her in the office and  evaluated her for a right hemicolectomy.   HOSPITAL COURSE:  This woman underwent a bowel prep at home and then was  brought into the hospital and admitted on May 27, 2008.  She was  transfused 2 units of blood prior to her operation due to her  chronically low hematocrit.  She underwent a right hemicolectomy as well  as biopsy of this liver lesion, which went without difficulty.  Her  pathology on this surgery was colonic adenocarcinoma associated with  ulceration extended at the pericolonic connective tissue with negative  margins and 20 benign lymph nodes.  Liver biopsy was benign liver  tissue.  This gave her a T3, N0 tumor.  She did well initially and on  postoperative day #3, she was begun on clear liquids and then this was  advanced to full liquids.  By postoperative day #6, she had three bowel  movements, but also had an episode of emesis that she thought was from  the pain medication that she was taking that had  been switched to oral  pain medication.  The following day on postoperative day #7, she was  still nauseated, was passing gas, and had two stools, was unsure of why  she felt nauseated.  We switched her medications at that point to see if  she was continuing to improve.  Her white blood cell count was normal at  that point.  Postoperative day #8, she continued to pass a small amount  of gas, but she still complained of some back pain as well as an ileus.  She remained afebrile with a normal white count, but I did obtain a CT  scan at that point to reevaluate this area.  There was no real objective  evidence.  Her CT scan was cancelled at that point because she appeared  to be resolving, had no more nausea, no emesis and flatus, and was  passing bowel movements at that point, but on postoperative day #10, she  still had some continued nausea, but was still passing some gas and some  flatus intermittently.  I did obtain a CT scan at that point, which  showed no  obstruction or abscess, but appeared that she might have have  an internal hernia that point.  We discussed reexploring her at that  point, which I did on June 06, 2008.  She did have a postop partial  small bowel obstruction with kinking of her duodenum.  This was repaired  operatively and she then was returned to the floor where she was  recovering.  Her white blood cell count remained normal initially and  she was very slow to come along in terms of her bowel function.  Her  hematocrit was decreased to 24.5 and has remained stable at that point.  She did not receive another blood transfusion.  The NG tube remained in  place eventually on postoperative day 14 and 4.  She was transfused 1  unit of blood for hematocrit of 23.5 and this increased to 33.7  following this.  She was also diuresed at that point.  On postoperative  day #5 from this, her NG tube was discontinued and she remained n.p.o.  She also had some drainage from her staples.  I removed these, but there  was no evidence of any infection, just some hematoma that was present.  She was remained on TNA after this second operation and she was  tolerating this very well.  On postoperative day #7, she was noted to  have a UTI on a UA that I obtained for some symptoms and she was begun  on ciprofloxacin.  She did tolerate her NG tube out and began her on  clears at that point.  On postoperative day #9 from her second  procedure, she was passing gas and had bowel movement as well and she  was as well hungry.  She was treated for UTI and I advanced her diet to  a regular diet until postoperative day #10 when she started developing  copious diarrhea overnight with some abdominal pain.  We checked a  Clostridium difficile, which ended up being positive.  This was treated  with Flagyl which she remained on in the hospital for a number of days  with some slow improvement in her Clostridium difficile.  During this,  she remained on TNA.   Eventually on August 23, 2008, she began tolerating  clears and a soft diet, but bowel movements were not watery, much  improved abdominal pain, and I discharged her to  home with followup in 1  week.   RADIOLOGIC EXAMINATION:  She had a CT scan prior to the second operation  on June 05, 2008, that showed possible internal hernia from the site of  her mesenteric dissection from her right colectomy as well as  preoperative CT scan which was biopsied that was negative on biopsy and  exploration.   LABORATORY EVALUATION:  Her hematocrit upon admission was 24.5.  Her CEA  was 9.5.  Her BUN and creatinine were 8 and 0.65.  Her liver function  tests were all normal.  The other reported laboratory tests during this  hospital stay was C. diff that was positive as well as hematocrit of  23.5 for which she received 1 unit of blood postoperatively.      Juanetta Gosling, MD  Electronically Signed     MCW/MEDQ  D:  08/25/2008  T:  08/26/2008  Job:  [id number]

## 2010-07-16 NOTE — Op Note (Signed)
NAME:  Amber [name], Amber [name]                         ACCOUNT NO.:  [id number]   MEDICAL RECORD NO.:  [id number]                   PATIENT TYPE:  AMB   LOCATION:  [facility]                                 FACILITY:  [facility]   PHYSICIAN:  Ronald L. Ovidio Hanger, M.D.           DATE OF BIRTH:  [birth year]   DATE OF PROCEDURE:  02/12/2002  DATE OF DISCHARGE:                                 OPERATIVE REPORT   DIAGNOSIS:  Stress urinary incontinence, type 3.   OPERATIVE PROCEDURE:  Transvaginal tape sling procedure and  cystourethroscopy.   SURGEON:  Lucrezia Starch. Earlene Plater, M.D.   ANESTHESIA:  General laryngeal airway.   ESTIMATED BLOOD LOSS:  50 cc.   TUBES:  None.  A vaginal pack was placed.   COMPLICATIONS:  None.   INDICATIONS FOR PROCEDURE:  Amber [name] is a lovely 77 year old white female,  who presents with significant long-term stress urinary incontinence.  She  underwent a work-up in 1997 and had a 60 cm water of Valsalva leak point  pressure.  She elected at that time not to do anything, but it has continued  to progress, and she represented on January 10, 2002, with continued  leakage which was terribly troublesome.  She was subsequently found to have  a positive Marshall test and had good bladder and urethral support on a  straining cystogram of slight descensus, but significant beaking was noted.  She subsequently underwent urodynamic evaluation which revealed that she was  intact neurologically; however, she had a Valsalva leak when the pressure  was 30 cm of water with gush and after understanding risks, benefits, and  alternatives, she elected to proceed with a TVT sling procedure.   PROCEDURE IN DETAIL:  The patient was placed in supine position.  After  proper general laryngeal airway anesthesia, was placed in the dorsal  lithotomy position and prepped and draped with Betadine in a sterile  fashion.  A 16 French Foley catheter was inserted, and the bladder was  completely drained,  and a vertical incision was made at the urethrovesical  junction.  Vaginal flaps were created after approximately 5 cc of 1%  Xylocaine with epinephrine had been placed.  The endopelvic fascia was not  perforated.  At 1 fingerbreadth superior and 2 fingerbreadths lateral to the  midline, punch holes were obtained with the abdominal inserter device and  carried down to a finger inserted on the endopelvic fascia bilaterally.  Cystourethroscopy was then performed, and there was no perforation of the  bladder noted.  Utilizing the 70 degree lenses and the bladder completely  distended, this was noted.  The transvaginal tape procedure was then placed  with the inserter devices and localized to what was felt to be a good  location, and the sling was then deployed with the slides being removed.  A  hemostat was held behind the sling so it would not pull too tight.  There  was a good space between the sling, and the sling was deployed well at the  urethrovesical junction, and we were comfortable with the tension placed.  The vaginal mucosa was then closed with a running lock 0 Vicryl suture,  utilizing the UR-5 needle, and cystourethroscopy was performed again, and  the bladder was completely distended, and there was no perforation noted.  When the cystoscope was removed, there was no leakage but with Valsalva  maneuver, there was slight leakage, again feeling that the sling was in good  position.  The bladder was then drained and the cystoscope removed.  The  sling was cut with scissors just below the skin margin, and the skin punch  holes were closed with Tegaderm.  A light vaginal pack was placed with one-  inch Iodoform gauze with Bacitracin ointment on it, and the patient was  taken to the recovery room stable.                                               Ronald L. Ovidio Hanger, M.D.    RLD/MEDQ  D:  02/12/2002  T:  02/12/2002  Job:  [id number]

## 2010-09-20 ENCOUNTER — Encounter (HOSPITAL_BASED_OUTPATIENT_CLINIC_OR_DEPARTMENT_OTHER): Payer: Medicare Other | Admitting: Oncology

## 2010-09-20 ENCOUNTER — Other Ambulatory Visit: Payer: Self-pay | Admitting: Oncology

## 2010-09-20 DIAGNOSIS — C182 Malignant neoplasm of ascending colon: Secondary | ICD-10-CM

## 2010-09-20 DIAGNOSIS — K7689 Other specified diseases of liver: Secondary | ICD-10-CM

## 2010-09-20 LAB — CEA: CEA: 0.6 ng/mL (ref 0.0–5.0)

## 2010-09-26 ENCOUNTER — Other Ambulatory Visit: Payer: Self-pay | Admitting: Cardiology

## 2010-09-26 DIAGNOSIS — I10 Essential (primary) hypertension: Secondary | ICD-10-CM

## 2010-09-27 NOTE — Telephone Encounter (Signed)
Faxed refill for amlodipine

## 2010-10-06 ENCOUNTER — Encounter: Payer: Self-pay | Admitting: Cardiology

## 2010-10-12 ENCOUNTER — Encounter: Payer: Self-pay | Admitting: Cardiology

## 2010-10-12 ENCOUNTER — Telehealth: Payer: Self-pay | Admitting: Family Medicine

## 2010-10-12 ENCOUNTER — Encounter: Payer: Self-pay | Admitting: Family Medicine

## 2010-10-12 NOTE — Telephone Encounter (Signed)
Normal mammogram, please notify pt.  Thanks.

## 2010-10-13 NOTE — Telephone Encounter (Signed)
Patient advised.

## 2010-10-15 ENCOUNTER — Other Ambulatory Visit: Payer: Self-pay | Admitting: Cardiology

## 2010-10-15 DIAGNOSIS — Z8619 Personal history of other infectious and parasitic diseases: Secondary | ICD-10-CM

## 2010-10-15 DIAGNOSIS — E7800 Pure hypercholesterolemia, unspecified: Secondary | ICD-10-CM

## 2010-10-15 DIAGNOSIS — M549 Dorsalgia, unspecified: Secondary | ICD-10-CM

## 2010-10-15 DIAGNOSIS — I10 Essential (primary) hypertension: Secondary | ICD-10-CM

## 2010-10-18 ENCOUNTER — Other Ambulatory Visit (INDEPENDENT_AMBULATORY_CARE_PROVIDER_SITE_OTHER): Payer: Medicare Other | Admitting: *Deleted

## 2010-10-18 ENCOUNTER — Ambulatory Visit (INDEPENDENT_AMBULATORY_CARE_PROVIDER_SITE_OTHER): Payer: Medicare Other | Admitting: Cardiology

## 2010-10-18 ENCOUNTER — Other Ambulatory Visit: Payer: Self-pay | Admitting: Cardiology

## 2010-10-18 VITALS — BP 140/82 | HR 62 | Ht 63.0 in | Wt 140.0 lb

## 2010-10-18 DIAGNOSIS — I119 Hypertensive heart disease without heart failure: Secondary | ICD-10-CM

## 2010-10-18 DIAGNOSIS — E7800 Pure hypercholesterolemia, unspecified: Secondary | ICD-10-CM

## 2010-10-18 DIAGNOSIS — I10 Essential (primary) hypertension: Secondary | ICD-10-CM

## 2010-10-18 DIAGNOSIS — Z8619 Personal history of other infectious and parasitic diseases: Secondary | ICD-10-CM

## 2010-10-18 DIAGNOSIS — Z85038 Personal history of other malignant neoplasm of large intestine: Secondary | ICD-10-CM

## 2010-10-18 DIAGNOSIS — M549 Dorsalgia, unspecified: Secondary | ICD-10-CM

## 2010-10-18 LAB — CBC WITH DIFFERENTIAL/PLATELET
Basophils Absolute: 0 10*3/uL (ref 0.0–0.1)
Eosinophils Relative: 1.7 % (ref 0.0–5.0)
HCT: 42.9 % (ref 36.0–46.0)
Lymphocytes Relative: 43 % (ref 12.0–46.0)
Monocytes Relative: 7.9 % (ref 3.0–12.0)
Neutrophils Relative %: 46.8 % (ref 43.0–77.0)
Platelets: 232 10*3/uL (ref 150.0–400.0)
RDW: 13.1 % (ref 11.5–14.6)
WBC: 6 10*3/uL (ref 4.5–10.5)

## 2010-10-18 LAB — BASIC METABOLIC PANEL
BUN: 17 mg/dL (ref 6–23)
Calcium: 9 mg/dL (ref 8.4–10.5)
Creatinine, Ser: 0.7 mg/dL (ref 0.4–1.2)

## 2010-10-18 LAB — LIPID PANEL
Cholesterol: 226 mg/dL — ABNORMAL HIGH (ref 0–200)
HDL: 61.6 mg/dL (ref >39.00)
Total CHOL/HDL Ratio: 4
Triglycerides: 85 mg/dL (ref 0.0–149.0)
VLDL: 17 mg/dL (ref 0.0–40.0)

## 2010-10-18 LAB — HEPATIC FUNCTION PANEL
Bilirubin, Direct: 0.1 mg/dL (ref 0.0–0.3)
Total Bilirubin: 0.9 mg/dL (ref 0.3–1.2)

## 2010-10-18 NOTE — Progress Notes (Signed)
Amber [name] Date of Birth:  March 30, 1933 [facility] [address]   [address] [place], [place]  [address] [phone]           Fax   [phone]  History of Present Illness: This pleasant 77 year old woman is seen for a scheduled six-month followup office visit.  She has a past history of colon cancer which was removed in March of 2010.  She presented with marked anemia.  She did not require postop chemotherapy.  There's been no evidence of any recurrence.  Her hemoglobin remains normal.  The patient also has a history of essential hypertension and she has a history of hypercholesterolemia.  She is intolerant to Zetia and to the statins.  His last visit she's been feeling well.  No chest pain or shortness of breath.  She does not sleep well at night because of muscle aches and restless legs.  She also has some problems with low back pain and has been told that she has a bad disc in her spine but she is told her surgeon that she does not want any surgery.  Current Outpatient Prescriptions  Medication Sig Dispense Refill  . amLODipine (NORVASC) 10 MG tablet TAKE 1 TABLET BY MOUTH EVERY DAY  90 tablet  3  . atenolol (TENORMIN) 25 MG tablet Take 25 mg by mouth daily.        . Multiple Vitamin (MULTIVITAMIN) tablet Take 1 tablet by mouth daily.        Marland Kitchen omega-3 acid ethyl esters (LOVAZA) 1 G capsule Take 2 g by mouth 2 (two) times daily.         Current Facility-Administered Medications  Medication Dose Route Frequency Provider Last Rate Last Dose  . diptheria-tetanus toxoids (DECAVAC) 2-2 LF/0.5ML injection 0.5 mL  0.5 mL Intramuscular Once Joaquim Nam, MD        Allergies  Allergen Reactions  . Ace Inhibitors Cough  . Lexapro   . Lipitor (Atorvastatin Calcium)   . Zetia (Ezetimibe)   . Zocor (Simvastatin - High Dose)     Patient Active Problem List  Diagnoses  . Essential hypertension, benign  . Pure hypercholesterolemia  . Back pain  . TMJ  click  . History of colon cancer  . Stress incontinence, female  . History of Clostridium difficile infection  . S/P cataract extraction    History  Smoking status  . Never Smoker   Smokeless tobacco  . Not on file    History  Alcohol Use No    Family History  Problem Relation Age of Onset  . Cancer Mother     breast cancer  . Breast cancer Mother   . Cancer Father     colon cancer  . Stroke Father   . Colon cancer Father   . Cancer Brother     bladder cancer  . Cancer Brother     throat cancer    Review of Systems: Constitutional: no fever chills diaphoresis or fatigue or change in weight.  Head and neck: no hearing loss, no epistaxis, no photophobia or visual disturbance. Respiratory: No cough, shortness of breath or wheezing. Cardiovascular: No chest pain peripheral edema, palpitations. Gastrointestinal: No abdominal distention, no abdominal pain, no change in bowel habits hematochezia or melena. Genitourinary: No dysuria, no frequency, no urgency, no nocturia. Musculoskeletal:No arthralgias, no back pain, no gait disturbance or myalgias. Neurological: No dizziness, no headaches, no numbness, no seizures, no syncope, no weakness, no tremors. Hematologic: No  lymphadenopathy, no easy bruising. Psychiatric: No confusion, no hallucinations, no sleep disturbance.    Physical Exam: Filed Vitals:   10/18/10 0920  BP: 140/82  Pulse: 62  The general appearance reveals a well-developed well-nourished woman in no distress.The head and neck exam reveals pupils equal and reactive.  Extraocular movements are full.  There is no scleral icterus.  The mouth and pharynx are normal.  The neck is supple.  The carotids reveal no bruits.  The jugular venous pressure is normal.  The  thyroid is not enlarged.  There is no lymphadenopathy.  The chest is clear to percussion and auscultation.  There are no rales or rhonchi.  Expansion of the chest is symmetrical.  The precordium is quiet.   The first heart sound is normal.  The second heart sound is physiologically split.  There is no murmur gallop rub or click.  There is no abnormal lift or heave.  The abdomen is soft and nontender.  The bowel sounds are normal.  The liver and spleen are not enlarged.  There are no abdominal masses.  There are no abdominal bruits.  Extremities reveal good pedal pulses.  There is no phlebitis or edema.  There is no cyanosis or clubbing.  Strength is normal and symmetrical in all extremities.  There is no lateralizing weakness.  There are no sensory deficits.  The skin is warm and dry.  There is no rash.  EKG shows normal sinus rhythm and minor nonspecific T-wave flattening.   Assessment / Plan: Continue same medication except add Crestor 5 mg one daily for LDL cholesterol of 152.  Watch diet carefully.  Recheck in 6 months for followup office visit and fasting lab work

## 2010-10-18 NOTE — Assessment & Plan Note (Signed)
The patient has a history of hypercholesterolemia.  She has not tolerated Zetia which cause GI problems or simvastatin which caused constipation and Lipitor which caused weakness and leg pain.  She is trying to control herCholesterol with diet at this point.We will try adding a low dose of Crestor 5 mg a day for her current LDL of 152

## 2010-10-18 NOTE — Assessment & Plan Note (Signed)
The patient has not been having any headaches or dizziness.  No exertional chest pain.  No shortness of breath.  She is trying to limit her salt intake.

## 2010-10-18 NOTE — Assessment & Plan Note (Signed)
The patient has not been having any problems with bowel movements.  There's been no hematochezia or melena.  Her previous problems with constipation have resolved.  We checked a CBC today her hemoglobin is normal.

## 2010-10-19 ENCOUNTER — Encounter: Payer: Self-pay | Admitting: Cardiology

## 2010-10-20 ENCOUNTER — Telehealth: Payer: Self-pay | Admitting: *Deleted

## 2010-10-20 NOTE — Telephone Encounter (Signed)
Advised patient of labs and samples given for crestor 5mg .

## 2010-10-20 NOTE — Telephone Encounter (Signed)
Message copied by Eugenia Pancoast on Wed Oct 20, 2010 10:01 AM ------      Message from: Cassell Clement      Created: Mon Oct 18, 2010  5:06 PM       Please Report.The CBC is normal.  Hemoglobin 14.5.  There is no anemia now.      The cholesterol is 226And the triglycerides are normal.  Continue prudent diet and continue same medication.

## 2010-11-19 IMAGING — CT CT PELVIS W/ CM
2 of 5 series · 15 of 42 positions shown, 19 images · IV contrast (READICAT/WATER & [ID] OMNI 300)
Comparison: None

CT ABDOMEN

CLINICAL DATA: Status post colonoscopy, suspect malignancy and
proximal descending colon

CT ABDOMEN AND PELVIS WITH CONTRAST
TECHNIQUE: Multidetector CT imaging of the abdomen and pelvis was
performed using the standard protocol following bolus
administration of intravenous contrast.
Contrast: 100 ml of omni 300

[Series 2: abdomen w/ · axial · 0.70mm/px · z∈[-395,-20]mm · 12 of 85 slices shown, 16 images]
[im 5/85  soft-tissue]
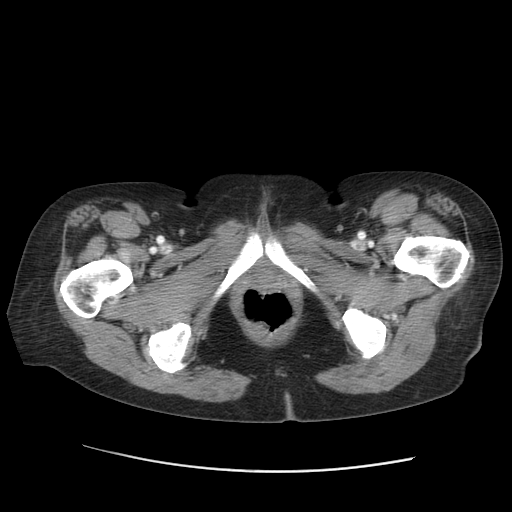
[im 5/85  bone]
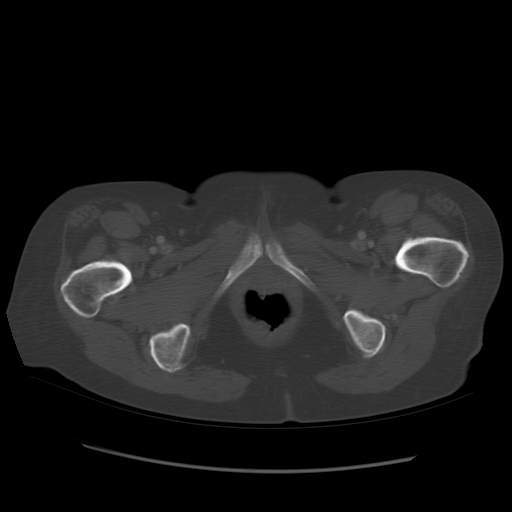
[im 14/85  soft-tissue]
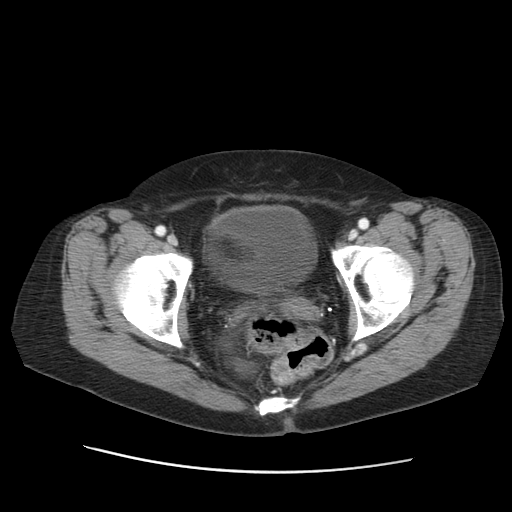
[im 23/85  soft-tissue]
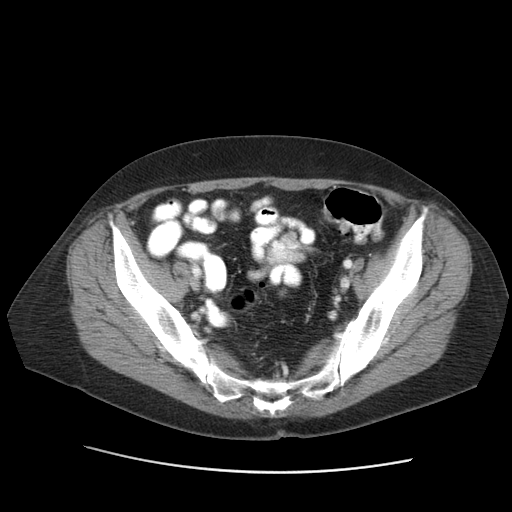
[im 31/85  soft-tissue]
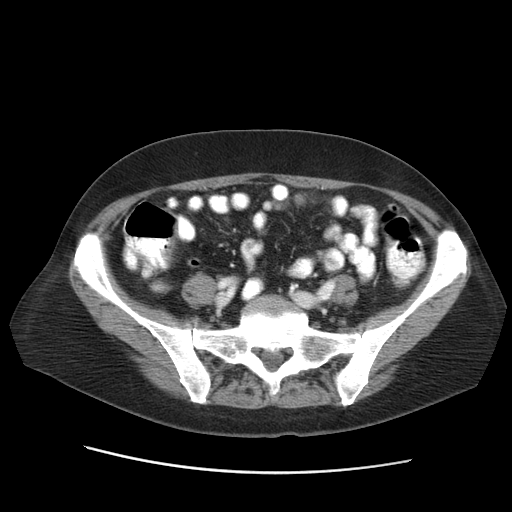
[im 40/85  soft-tissue]
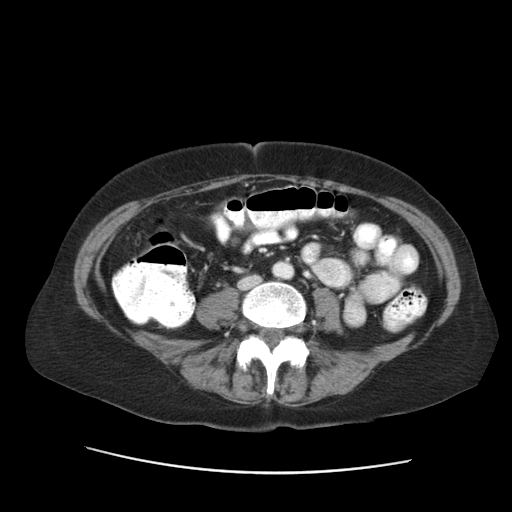
[im 45/85  soft-tissue]
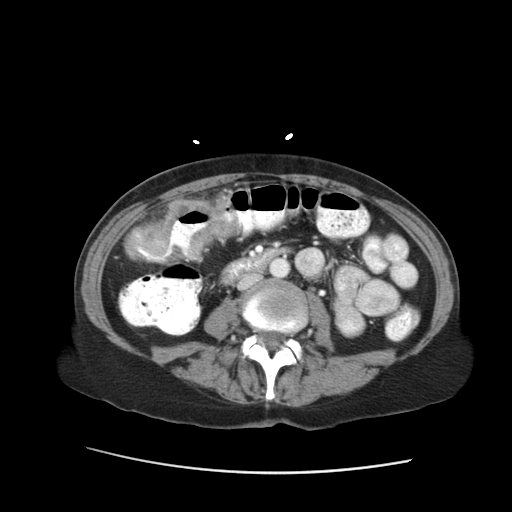
[im 54/85  soft-tissue]
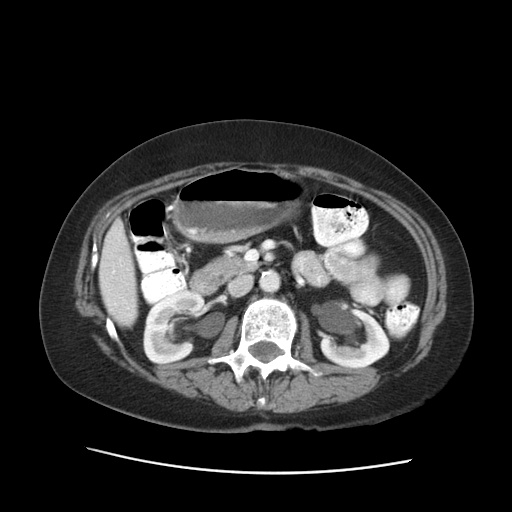
[im 62/85  soft-tissue]
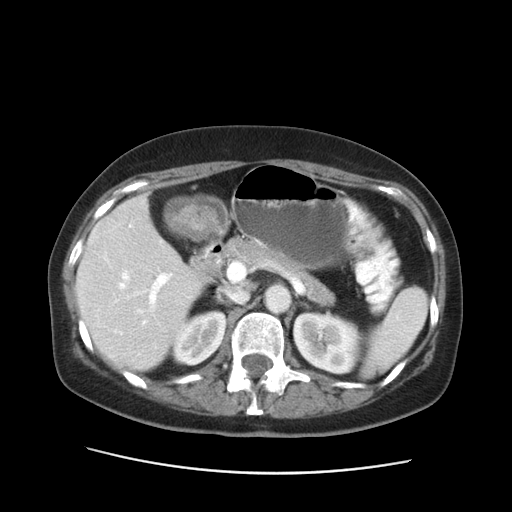
[im 67/85  lung]
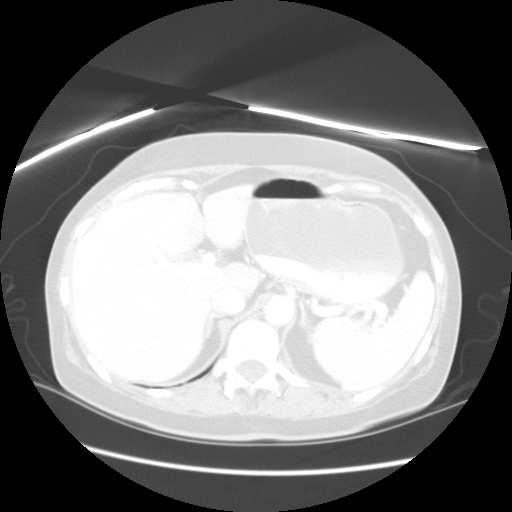
[im 71/85  soft-tissue]
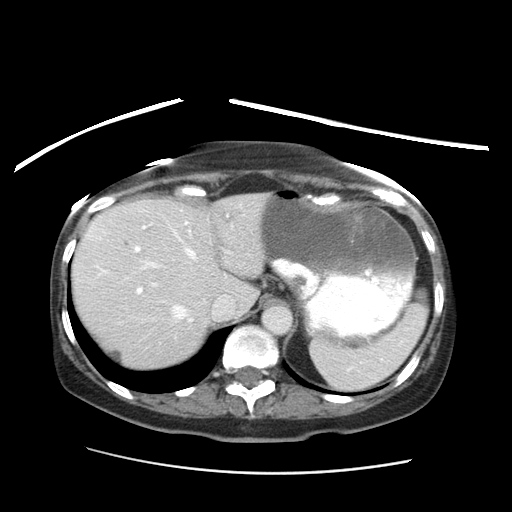
[im 71/85  lung]
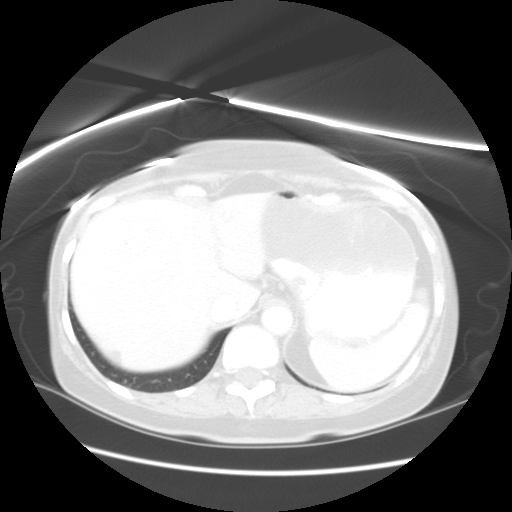
[im 71/85  bone]
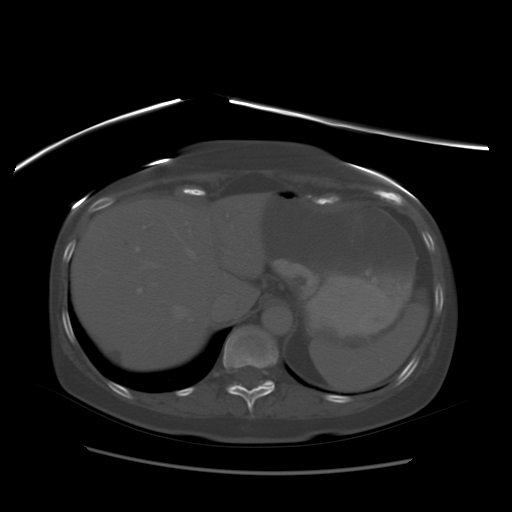
[im 76/85  lung]
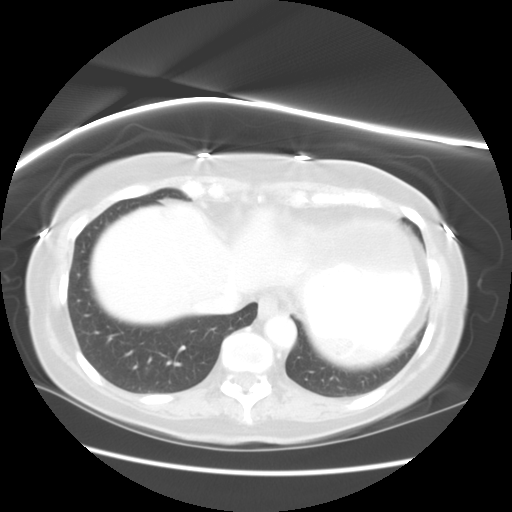
[im 80/85  soft-tissue]
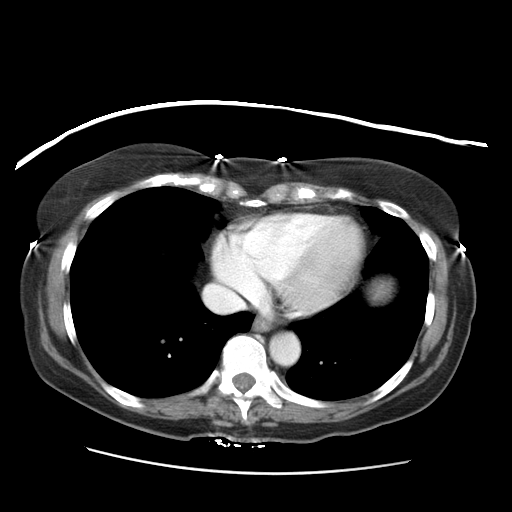
[im 80/85  lung]
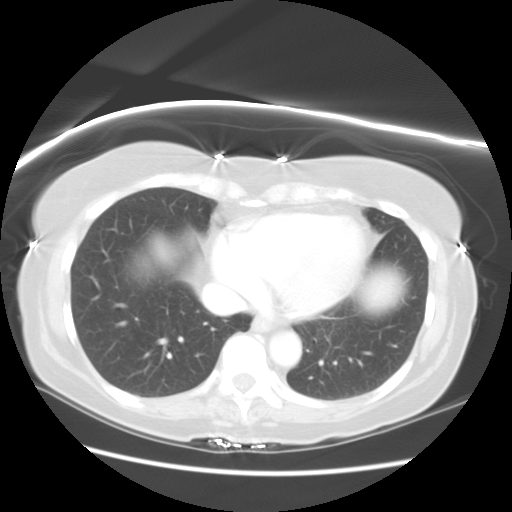

[Series 400: sag · sagittal · 0.84mm/px · 3 of 114 slices shown]
[im 29/114  soft-tissue]
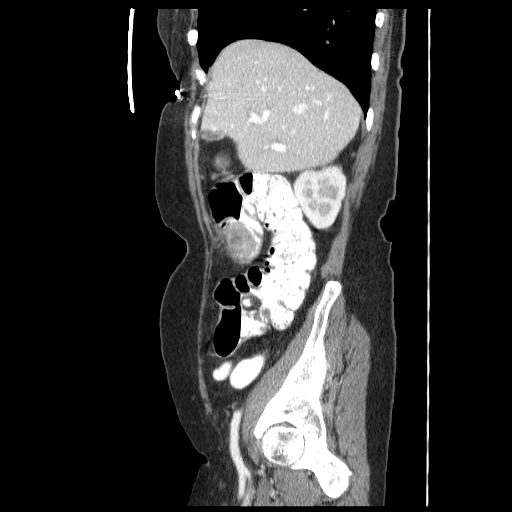
[im 57/114  soft-tissue]
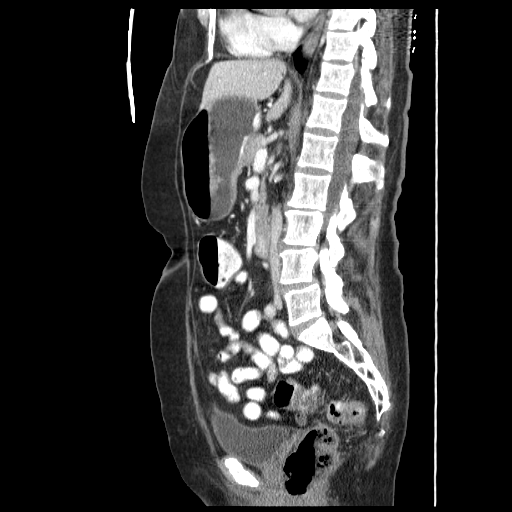
[im 85/114  soft-tissue]
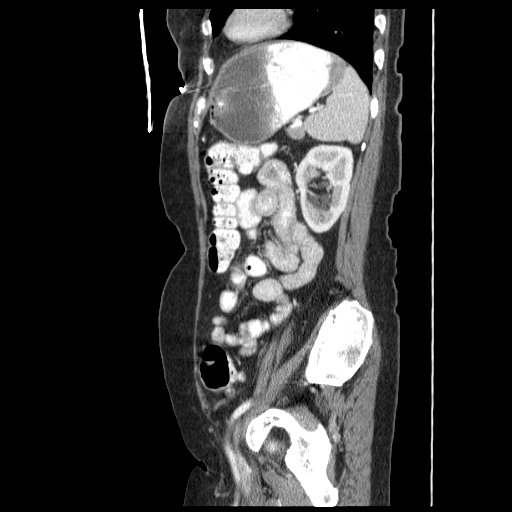

[15 of 42 positions shown; findings below may reference images not displayed]

FINDINGS: The lung bases are clear.

There is no pericardial or pleural effusion.

The spleen is normal.

There is a small hypodense structure within the inferior right
hepatic lobe measuring 6.6 mm, image 33.  This is too small to
characterize.  Within the posterior right hepatic lobe there is an
8.2 mm low density structure, image number 16.  This is also too
small to characterize.

The gallbladder is negative.

There is no biliary dilatation.

The pancreas is normal.

The adrenal glands are both normal.

Both of the kidneys have a normal appearance no enlarged
retroperitoneal or small bowel mesenteric lymph nodes.

There is no free fluid within the upper abdomen.

The proximal transverse colon is abnormal in appearance there is
mass-like wall thickening.  This extends over a segment of the
proximal transverse colon measuring 9 cm.

Review of the visualized osseous structures is negative.
IMPRESSION: 1.  Proximal transverse colon mass consistent with tumor found on
colonoscopy.
2.  Two low density structures within the right hepatic lobe are
too small to reliably characterize.
3.  No pathologic lymphadenopathy within the upper abdomen

CT PELVIS
FINDINGS: Diverticular changes affect the sigmoid colon.

There is no active inflammation within the pelvis.

There is no pelvic mass.

No pelvic adenopathy.

The urinary bladder is normal.

No free fluid or abnormal fluid collections noted.

Review of visualized osseous structures is unremarkable.
IMPRESSION: 1.  Negative for pelvic mass or adenopathy.

## 2010-12-05 IMAGING — CR DG ABD PORTABLE 1V
1 series · 1 of 1 positions shown · non-contrast
Comparison: CT of the abdomen and pelvis 06/05/2008 and earlier.

CLINICAL DATA: 74-year-old female with colon cancer, nausea,
vomiting.

ABDOMEN - 1 VIEW

[view not recorded]
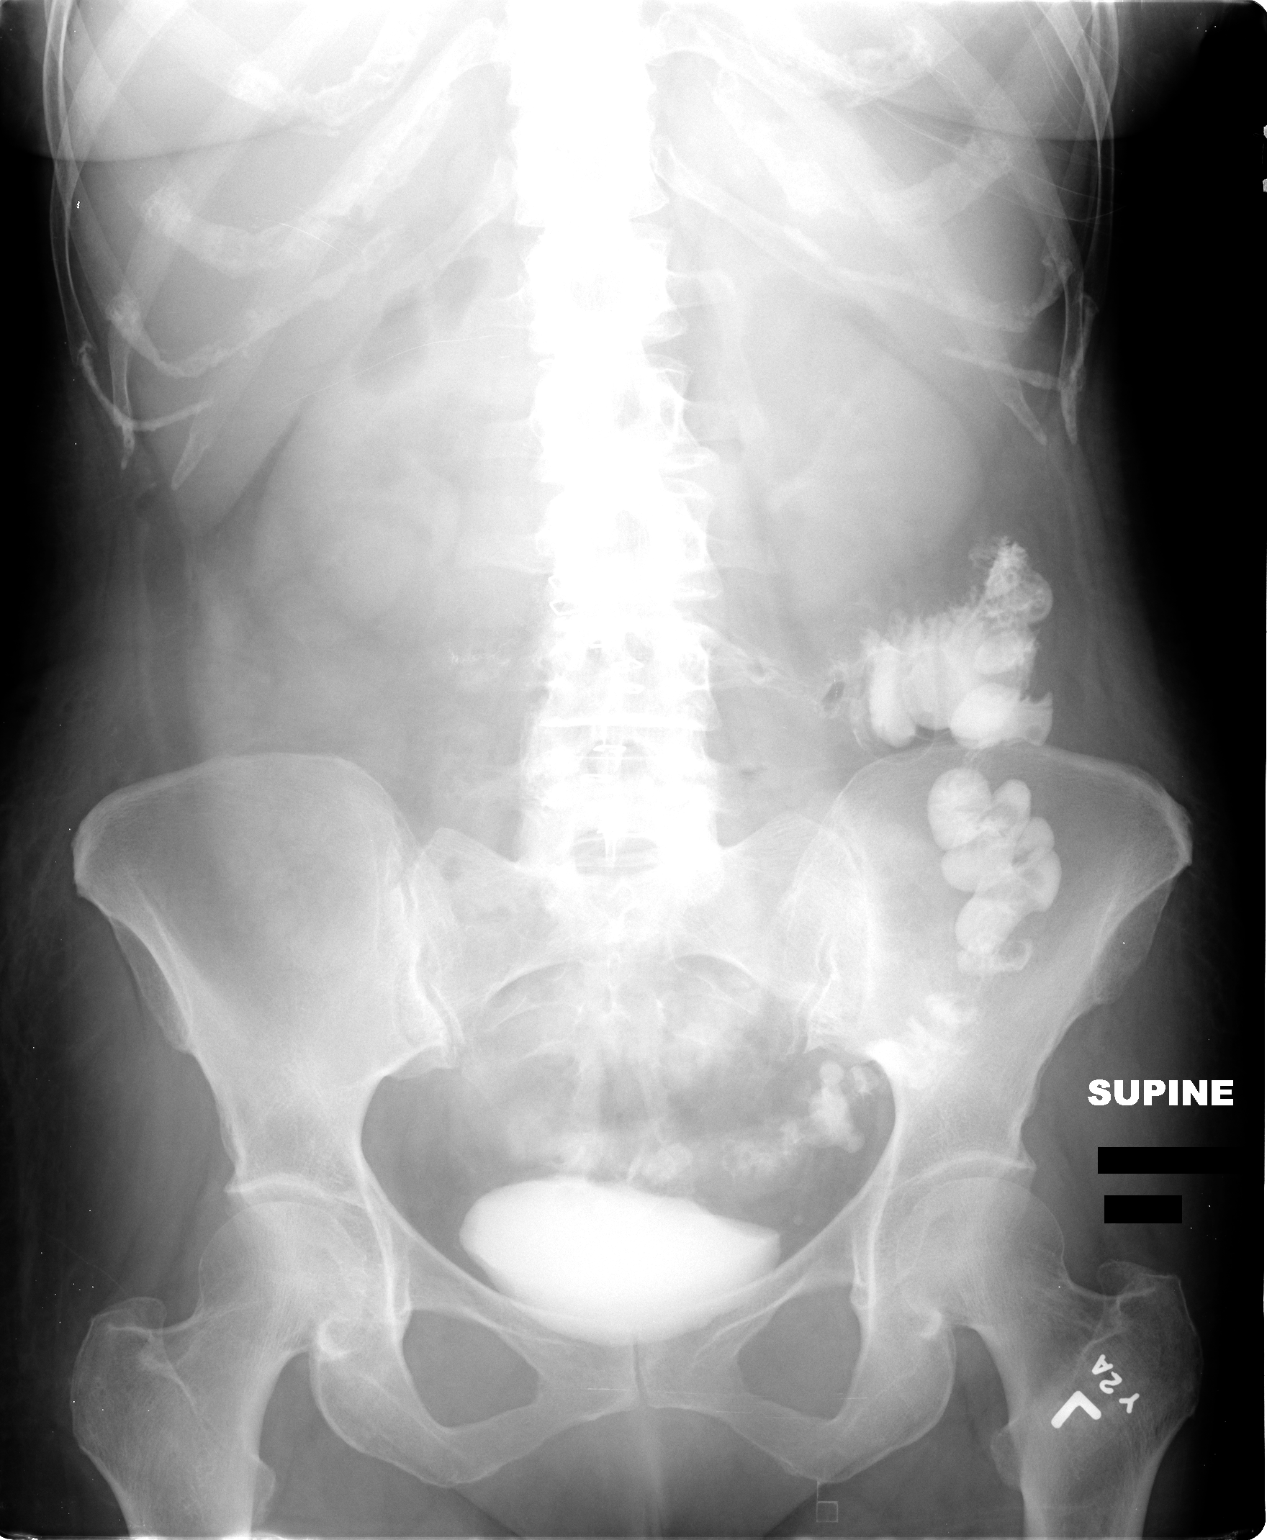

[1 of 1 positions shown; findings below may reference images not displayed]

FINDINGS: Portable supine AP view at 8097 hours.  Oral contrast
administered for yesterday's CT now projects in the distal colon.
There is excreted intravenous contrast in the bladder.  There is a
paucity of bowel gas elsewhere.  Visceral contours are within
normal limits.  Staple line at the right lower abdomen near
midline.  Midline skin staples.
IMPRESSION: Yesterday's CT oral contrast has reached the rectum, without
evidence of bowel obstruction.  Otherwise paucity of bowel gas.  IV
contrast in the bladder.

## 2011-02-27 ENCOUNTER — Telehealth: Payer: Self-pay | Admitting: Oncology

## 2011-02-27 NOTE — Telephone Encounter (Signed)
Mailed the pt her jan,july 2013 appts. Unable to contact by telephone

## 2011-03-22 ENCOUNTER — Other Ambulatory Visit: Payer: Medicare Other | Admitting: Lab

## 2011-03-22 DIAGNOSIS — C182 Malignant neoplasm of ascending colon: Secondary | ICD-10-CM | POA: Diagnosis not present

## 2011-03-22 DIAGNOSIS — Z800 Family history of malignant neoplasm of digestive organs: Secondary | ICD-10-CM | POA: Diagnosis not present

## 2011-03-22 DIAGNOSIS — K7689 Other specified diseases of liver: Secondary | ICD-10-CM | POA: Diagnosis not present

## 2011-03-22 DIAGNOSIS — C189 Malignant neoplasm of colon, unspecified: Secondary | ICD-10-CM | POA: Diagnosis not present

## 2011-03-30 ENCOUNTER — Telehealth: Payer: Self-pay | Admitting: *Deleted

## 2011-03-30 NOTE — Telephone Encounter (Signed)
Pt. called for result of CEA.  Call returned & reported result was 0.8 & normal.

## 2011-06-06 ENCOUNTER — Encounter: Payer: Self-pay | Admitting: Family Medicine

## 2011-06-06 ENCOUNTER — Ambulatory Visit (INDEPENDENT_AMBULATORY_CARE_PROVIDER_SITE_OTHER): Payer: Medicare Other | Admitting: Family Medicine

## 2011-06-06 VITALS — BP 146/92 | HR 80 | Temp 98.1°F | Wt 138.0 lb

## 2011-06-06 DIAGNOSIS — R5381 Other malaise: Secondary | ICD-10-CM | POA: Diagnosis not present

## 2011-06-06 DIAGNOSIS — R5383 Other fatigue: Secondary | ICD-10-CM

## 2011-06-06 DIAGNOSIS — Z85038 Personal history of other malignant neoplasm of large intestine: Secondary | ICD-10-CM

## 2011-06-06 LAB — CBC WITH DIFFERENTIAL/PLATELET
Basophils Absolute: 0 10*3/uL (ref 0.0–0.1)
HCT: 40.5 % (ref 36.0–46.0)
Hemoglobin: 13.6 g/dL (ref 12.0–15.0)
Lymphs Abs: 2.3 10*3/uL (ref 0.7–4.0)
MCHC: 33.5 g/dL (ref 30.0–36.0)
Monocytes Relative: 7.5 % (ref 3.0–12.0)
Neutro Abs: 3.1 10*3/uL (ref 1.4–7.7)
RDW: 13.2 % (ref 11.5–14.6)

## 2011-06-06 NOTE — Assessment & Plan Note (Signed)
Unclear source.  Nontoxic on exam.  No pallor noted.  Check cbc/tsh and IFOB.  D/w pt.  She agrees.

## 2011-06-06 NOTE — Patient Instructions (Signed)
Go to the lab for a blood draw and pick up the stool kits.   We'll contact you with your lab report. Take care.

## 2011-06-06 NOTE — Progress Notes (Signed)
Over last 3 months, she's been more fatigued.  It's getting worse per patient.  She occ feels presyncopal but hasn't passed out.  No FCNAV.  No rash.  No blood in stool.  No cough.  Sleep is disrupted, this is not new and she doesn't think this is related.  No weight changes.  Occ night sweats but this isn't new.  No trigger that patient can identify.  No med changes.  No CP, not sob, BLE edema.  Sleeping on 1 pillow.  Mood has been okay, she doesn't think she is depressed.    H/o partial colectomy for colon cancer.  Has had eval with cancer center yearly.    PMH and SH reviewed  ROS: See HPI, otherwise noncontributory.  Meds, vitals, and allergies reviewed.   GEN: nad, alert and oriented HEENT: mucous membranes moist NECK: supple w/o LA CV: rrr. PULM: ctab, no inc wob ABD: soft, +bs, old scar noted, normal BS EXT: no edema SKIN: no acute rash

## 2011-06-15 ENCOUNTER — Other Ambulatory Visit: Payer: Medicare Other

## 2011-06-15 DIAGNOSIS — Z85038 Personal history of other malignant neoplasm of large intestine: Secondary | ICD-10-CM

## 2011-06-15 DIAGNOSIS — Z1211 Encounter for screening for malignant neoplasm of colon: Secondary | ICD-10-CM | POA: Diagnosis not present

## 2011-06-16 ENCOUNTER — Encounter: Payer: Self-pay | Admitting: *Deleted

## 2011-06-27 ENCOUNTER — Ambulatory Visit (INDEPENDENT_AMBULATORY_CARE_PROVIDER_SITE_OTHER): Payer: Medicare Other | Admitting: Cardiology

## 2011-06-27 ENCOUNTER — Encounter: Payer: Self-pay | Admitting: Cardiology

## 2011-06-27 VITALS — BP 134/84 | HR 60 | Ht 63.0 in | Wt 138.0 lb

## 2011-06-27 DIAGNOSIS — E7800 Pure hypercholesterolemia, unspecified: Secondary | ICD-10-CM | POA: Diagnosis not present

## 2011-06-27 DIAGNOSIS — I119 Hypertensive heart disease without heart failure: Secondary | ICD-10-CM | POA: Diagnosis not present

## 2011-06-27 DIAGNOSIS — Z85038 Personal history of other malignant neoplasm of large intestine: Secondary | ICD-10-CM | POA: Diagnosis not present

## 2011-06-27 DIAGNOSIS — I10 Essential (primary) hypertension: Secondary | ICD-10-CM

## 2011-06-27 LAB — LIPID PANEL
Total CHOL/HDL Ratio: 4
Triglycerides: 79 mg/dL (ref 0.0–149.0)

## 2011-06-27 LAB — HEPATIC FUNCTION PANEL
Alkaline Phosphatase: 76 U/L (ref 39–117)
Bilirubin, Direct: 0.1 mg/dL (ref 0.0–0.3)
Total Bilirubin: 0.6 mg/dL (ref 0.3–1.2)

## 2011-06-27 LAB — BASIC METABOLIC PANEL
BUN: 14 mg/dL (ref 6–23)
CO2: 28 mEq/L (ref 19–32)
Chloride: 106 mEq/L (ref 96–112)
Creatinine, Ser: 0.6 mg/dL (ref 0.4–1.2)

## 2011-06-27 MED ORDER — ATENOLOL 25 MG PO TABS: 25.0000 mg | ORAL_TABLET | Freq: Every day | ORAL | Status: DC

## 2011-06-27 MED ORDER — LOSARTAN POTASSIUM 50 MG PO TABS: 50.0000 mg | ORAL_TABLET | Freq: Every day | ORAL | Status: DC

## 2011-06-27 NOTE — Assessment & Plan Note (Signed)
Patient has a past history of high blood pressure.  She has not been having any headaches or dizzy spells.  No symptoms of CHF.

## 2011-06-27 NOTE — Progress Notes (Signed)
Amber [name] Date of Birth:  12-12-1933 [facility] [address] [place], [place]  [address] [phone]         Fax   [phone]  History of Present Illness: This pleasant 78 year old woman is seen for a six-month followup office visit.  She has a history of colon cancer which presented with marked anemia and her cancer was removed in March 2010 there has been no evidence of recurrence.  The patient also has a history of essential hypertension and history of hypercholesterolemia.  Since last visit she has been doing well except for some complaints of fatigue.  She had a recent CBC and check of her thyroid by Dr. Para March and those tests are done okay.  Current Outpatient Prescriptions  Medication Sig Dispense Refill  . amLODipine (NORVASC) 10 MG tablet TAKE 1 TABLET BY MOUTH EVERY DAY  90 tablet  3  . atenolol (TENORMIN) 25 MG tablet Take 1 tablet (25 mg total) by mouth daily.  90 tablet  3  . Multiple Vitamin (MULTIVITAMIN) tablet Take 1 tablet by mouth daily.        Marland Kitchen omega-3 acid ethyl esters (LOVAZA) 1 G capsule Take 2 g by mouth 2 (two) times daily.        Marland Kitchen DISCONTD: atenolol (TENORMIN) 25 MG tablet Take 25 mg by mouth daily.        Marland Kitchen losartan (COZAAR) 50 MG tablet Take 1 tablet (50 mg total) by mouth daily.  90 tablet  3    Allergies  Allergen Reactions  . Ace Inhibitors Cough  . Lexapro   . Lipitor (Atorvastatin Calcium)   . Zetia (Ezetimibe)   . Zocor (Simvastatin - High Dose)     Patient Active Problem List  Diagnoses  . Essential hypertension, benign  . Pure hypercholesterolemia  . Back pain  . TMJ click  . History of colon cancer  . Stress incontinence, female  . History of Clostridium difficile infection  . S/P cataract extraction  . Benign hypertensive heart disease without heart failure  . Fatigue    History  Smoking status  . Never Smoker   Smokeless tobacco  . Not on file    History  Alcohol Use No    Family History    Problem Relation Age of Onset  . Cancer Mother     breast cancer  . Breast cancer Mother   . Cancer Father     colon cancer  . Stroke Father   . Colon cancer Father   . Cancer Brother     bladder cancer  . Cancer Brother     throat cancer    Review of Systems: Constitutional: no fever chills diaphoresis or fatigue or change in weight.  Head and neck: no hearing loss, no epistaxis, no photophobia or visual disturbance. Respiratory: No cough, shortness of breath or wheezing. Cardiovascular: No chest pain peripheral edema, palpitations. Gastrointestinal: No abdominal distention, no abdominal pain, no change in bowel habits hematochezia or melena. Genitourinary: No dysuria, no frequency, no urgency, no nocturia. Musculoskeletal:No arthralgias, no back pain, no gait disturbance or myalgias. Neurological: No dizziness, no headaches, no numbness, no seizures, no syncope, no weakness, no tremors. Hematologic: No lymphadenopathy, no easy bruising. Psychiatric: No confusion, no hallucinations, no sleep disturbance.    Physical Exam: Filed Vitals:   06/27/11 1004  BP: 134/84  Pulse: 60   the general appearance reveals a well-developed well-nourished woman in no distress.The head and neck exam reveals pupils equal  and reactive.  Extraocular movements are full.  There is no scleral icterus.  The mouth and pharynx are normal.  The neck is supple.  The carotids reveal no bruits.  The jugular venous pressure is normal.  The  thyroid is not enlarged.  There is no lymphadenopathy.  The chest is clear to percussion and auscultation.  There are no rales or rhonchi.  Expansion of the chest is symmetrical.  The precordium is quiet.  The first heart sound is normal.  The second heart sound is physiologically split.  There is no murmur gallop rub or click.  There is no abnormal lift or heave.  The abdomen is soft and nontender.  The bowel sounds are normal.  The liver and spleen are not enlarged.  There  are no abdominal masses.  There are no abdominal bruits.  Extremities reveal good pedal pulses.  There is no phlebitis or edema.  There is no cyanosis or clubbing.  Strength is normal and symmetrical in all extremities.  There is no lateralizing weakness.  There are no sensory deficits.  The skin is warm and dry.  There is no rash.     Assessment / Plan: Her blood pressure is elevated today on the diastolic.  We are going to add losartan 50 mg one daily.  She is getting lab work today she will return in 2 weeks for a followup basal metabolic panel after starting the losartan.  She was unable to take ACE inhibitors because of cough.  Recheck in 3 months for followup office visit and basal metabolic panel.  Continue same dose of atenolol and amlodipine.

## 2011-06-27 NOTE — Assessment & Plan Note (Signed)
The patient has a past history of hypercholesterolemia.  She is intolerant of statins because of myalgias but is on Lovasa.  We are checking blood work today

## 2011-06-27 NOTE — Assessment & Plan Note (Signed)
Patient has had no awareness of hematochezia or melena or any symptoms of recurrent colon cancer

## 2011-06-27 NOTE — Patient Instructions (Addendum)
Start losartan 50 mg daily  Will obtain labs today and call you with the results (lp/bmet/hfp)  Return in 2 weeks for labs (bmet)  Your physician recommends that you schedule a follow-up appointment in: 3 month ov and bmet

## 2011-07-04 ENCOUNTER — Other Ambulatory Visit: Payer: Self-pay | Admitting: *Deleted

## 2011-07-04 ENCOUNTER — Telehealth: Payer: Self-pay | Admitting: *Deleted

## 2011-07-04 DIAGNOSIS — E7800 Pure hypercholesterolemia, unspecified: Secondary | ICD-10-CM

## 2011-07-04 MED ORDER — ROSUVASTATIN CALCIUM 5 MG PO TABS: 5.0000 mg | ORAL_TABLET | Freq: Every day | ORAL | Status: DC

## 2011-07-04 NOTE — Telephone Encounter (Signed)
Advised of labs 

## 2011-07-04 NOTE — Telephone Encounter (Signed)
Message copied by Burnell Blanks on Mon Jul 04, 2011  5:21 PM ------      Message from: Cassell Clement      Created: Mon Jun 27, 2011  7:26 PM       The LDL cholesterol is 146 which is still too high.  I would like her to try low-dose Crestor 5 mg daily.  Recheck a lipid panel and hepatic function panel in 2 months

## 2011-07-11 ENCOUNTER — Other Ambulatory Visit (INDEPENDENT_AMBULATORY_CARE_PROVIDER_SITE_OTHER): Payer: Medicare Other

## 2011-07-11 DIAGNOSIS — I119 Hypertensive heart disease without heart failure: Secondary | ICD-10-CM | POA: Diagnosis not present

## 2011-07-11 LAB — BASIC METABOLIC PANEL
CO2: 27 mEq/L (ref 19–32)
Calcium: 9 mg/dL (ref 8.4–10.5)
Creatinine, Ser: 0.8 mg/dL (ref 0.4–1.2)
Glucose, Bld: 91 mg/dL (ref 70–99)

## 2011-07-11 NOTE — Progress Notes (Signed)
Quick Note:  Please report to patient. The recent labs are stable. Continue same medication and careful diet. ______ 

## 2011-07-19 ENCOUNTER — Telehealth: Payer: Self-pay | Admitting: *Deleted

## 2011-07-19 NOTE — Telephone Encounter (Signed)
Advised of labs 

## 2011-07-19 NOTE — Telephone Encounter (Signed)
Message copied by Burnell Blanks on Tue Jul 19, 2011  8:38 AM ------      Message from: Cassell Clement      Created: Mon Jul 11, 2011  8:27 PM       Please report to patient.  The recent labs are stable. Continue same medication and careful diet.

## 2011-08-30 ENCOUNTER — Other Ambulatory Visit (INDEPENDENT_AMBULATORY_CARE_PROVIDER_SITE_OTHER): Payer: Medicare Other

## 2011-08-30 ENCOUNTER — Telehealth: Payer: Self-pay | Admitting: *Deleted

## 2011-08-30 DIAGNOSIS — E7800 Pure hypercholesterolemia, unspecified: Secondary | ICD-10-CM

## 2011-08-30 LAB — LIPID PANEL
Cholesterol: 155 mg/dL (ref 0–200)
HDL: 60.9 mg/dL (ref >39.00)
LDL Cholesterol: 75 mg/dL (ref 0–99)
Triglycerides: 97 mg/dL (ref 0.0–149.0)
VLDL: 19.4 mg/dL (ref 0.0–40.0)

## 2011-08-30 LAB — BASIC METABOLIC PANEL
BUN: 13 mg/dL (ref 6–23)
Chloride: 106 mEq/L (ref 96–112)
Creatinine, Ser: 0.7 mg/dL (ref 0.4–1.2)
GFR: 83.77 mL/min (ref >60.00)

## 2011-08-30 LAB — HEPATIC FUNCTION PANEL
ALT: 16 U/L (ref 0–35)
Albumin: 3.9 g/dL (ref 3.5–5.2)
Total Bilirubin: 0.8 mg/dL (ref 0.3–1.2)

## 2011-08-30 NOTE — Progress Notes (Signed)
Quick Note:  Please report to patient. The recent labs are stable. Continue same medication and careful diet. ______ 

## 2011-08-30 NOTE — Telephone Encounter (Signed)
Advised patient of lab results  

## 2011-08-30 NOTE — Telephone Encounter (Signed)
Message copied by Burnell Blanks on Tue Aug 30, 2011  2:51 PM ------      Message from: Cassell Clement      Created: Tue Aug 30, 2011  2:08 PM       Please report to patient.  The recent labs are stable. Continue same medication and careful diet.

## 2011-09-19 ENCOUNTER — Telehealth: Payer: Self-pay | Admitting: *Deleted

## 2011-09-19 ENCOUNTER — Other Ambulatory Visit (HOSPITAL_BASED_OUTPATIENT_CLINIC_OR_DEPARTMENT_OTHER): Payer: Medicare Other | Admitting: Lab

## 2011-09-19 ENCOUNTER — Ambulatory Visit (HOSPITAL_BASED_OUTPATIENT_CLINIC_OR_DEPARTMENT_OTHER): Payer: Medicare Other | Admitting: Nurse Practitioner

## 2011-09-19 ENCOUNTER — Encounter: Payer: Medicare Other | Admitting: Oncology

## 2011-09-19 VITALS — BP 117/74 | HR 66 | Temp 97.8°F | Ht 63.0 in | Wt 139.1 lb

## 2011-09-19 DIAGNOSIS — Z800 Family history of malignant neoplasm of digestive organs: Secondary | ICD-10-CM | POA: Diagnosis not present

## 2011-09-19 DIAGNOSIS — C182 Malignant neoplasm of ascending colon: Secondary | ICD-10-CM

## 2011-09-19 DIAGNOSIS — C189 Malignant neoplasm of colon, unspecified: Secondary | ICD-10-CM

## 2011-09-19 DIAGNOSIS — Z85038 Personal history of other malignant neoplasm of large intestine: Secondary | ICD-10-CM | POA: Diagnosis not present

## 2011-09-19 DIAGNOSIS — K7689 Other specified diseases of liver: Secondary | ICD-10-CM | POA: Diagnosis not present

## 2011-09-19 NOTE — Telephone Encounter (Signed)
gave  patient appointment for 09-18-2012 starting at 11:30am printed out calendar and gave to the patient

## 2011-09-19 NOTE — Progress Notes (Signed)
OFFICE PROGRESS NOTE  Interval history:  Ms. Disbrow returns as scheduled. She overall feels well. No change in bowel habits. No hematochezia or melena. She denies abdominal pain. She reports she is scheduled for a colonoscopy in January 2014. She has a good appetite. Her weight is stable. No nausea or vomiting. No shortness of breath. She has an occasional cough.  Her only complaint today is pain at the left jaw when chewing. Recently she has noted that the pain radiates to the ear. She denies any hearing difficulty.   Objective: Temperature 97.8, heart rate 66, blood pressure 117/74. Weight 139.1 pounds.  Oropharynx is without thrush or ulceration. No palpable cervical, supraclavicular, axillary or inguinal lymph nodes. Nontender over the left temporomandibular joint. No erythema. Lungs clear. Regular cardiac rhythm. Abdomen soft and nontender. No hepatomegaly. Extremities are without edema.  Lab Results: Lab Results  Component Value Date   WBC 6.0 06/06/2011   HGB 13.6 06/06/2011   HCT 40.5 06/06/2011   MCV 91.0 06/06/2011   PLT 247.0 06/06/2011    Chemistry:    Chemistry      Component Value Date/Time   NA 141 08/30/2011 0839   K 3.8 08/30/2011 0839   CL 106 08/30/2011 0839   CO2 29 08/30/2011 0839   BUN 13 08/30/2011 0839   CREATININE 0.7 08/30/2011 0839      Component Value Date/Time   CALCIUM 9.2 08/30/2011 0839   ALKPHOS 62 08/30/2011 0839   AST 19 08/30/2011 0839   ALT 16 08/30/2011 0839   BILITOT 0.8 08/30/2011 0839       Studies/Results: No results found.  Medications: I have reviewed the patient's current medications.  Assessment/Plan:  1. Stage II (T3 N0) colon cancer status post right hemicolectomy 05/27/2008. 2. Family history multiple cancers, including colon cancer. 3. Indeterminate liver lesions on a preoperative CT status post MRI of the liver on 08/18/2008 confirming multiple hepatic cysts. 4. Hypertension. 5. Pain at the left temporomandibular joint and left ear. Dr.  Truett Perna recommended she schedule an appointment with ENT.  Disposition-Ms. Adelsberger appears stable. She remains in clinical remission from the colon cancer. We will followup on the CEA from today. She will return for a followup visit in one year. She will contact the office in the interim with any problems.  Patient seen with Dr. Truett Perna.  Lonna Cobb ANP/GNP-BC

## 2011-09-21 ENCOUNTER — Telehealth: Payer: Self-pay | Admitting: *Deleted

## 2011-09-21 DIAGNOSIS — H9209 Otalgia, unspecified ear: Secondary | ICD-10-CM | POA: Diagnosis not present

## 2011-09-21 NOTE — Telephone Encounter (Signed)
Pt returned call, lab results given. She voiced understanding. 

## 2011-09-21 NOTE — Telephone Encounter (Signed)
Left message on voicemail for pt to call office.  

## 2011-09-21 NOTE — Telephone Encounter (Signed)
Message copied by Caleb Popp on Wed Sep 21, 2011 10:30 AM ------      Message from: Ladene Artist      Created: Tue Sep 20, 2011  9:57 PM       Please call patient, cea is normal

## 2011-10-05 ENCOUNTER — Ambulatory Visit (INDEPENDENT_AMBULATORY_CARE_PROVIDER_SITE_OTHER): Payer: Medicare Other | Admitting: Cardiology

## 2011-10-05 ENCOUNTER — Other Ambulatory Visit (INDEPENDENT_AMBULATORY_CARE_PROVIDER_SITE_OTHER): Payer: Medicare Other

## 2011-10-05 ENCOUNTER — Encounter: Payer: Self-pay | Admitting: Cardiology

## 2011-10-05 ENCOUNTER — Other Ambulatory Visit: Payer: Self-pay | Admitting: *Deleted

## 2011-10-05 VITALS — BP 120/74 | HR 60 | Ht 63.0 in | Wt 135.0 lb

## 2011-10-05 DIAGNOSIS — E7800 Pure hypercholesterolemia, unspecified: Secondary | ICD-10-CM

## 2011-10-05 DIAGNOSIS — R29898 Other symptoms and signs involving the musculoskeletal system: Secondary | ICD-10-CM

## 2011-10-05 DIAGNOSIS — I119 Hypertensive heart disease without heart failure: Secondary | ICD-10-CM

## 2011-10-05 DIAGNOSIS — M2669 Other specified disorders of temporomandibular joint: Secondary | ICD-10-CM | POA: Diagnosis not present

## 2011-10-05 DIAGNOSIS — R0989 Other specified symptoms and signs involving the circulatory and respiratory systems: Secondary | ICD-10-CM

## 2011-10-05 LAB — BASIC METABOLIC PANEL
BUN: 17 mg/dL (ref 6–23)
CO2: 29 mEq/L (ref 19–32)
Calcium: 9.4 mg/dL (ref 8.4–10.5)
Creatinine, Ser: 0.8 mg/dL (ref 0.4–1.2)
GFR: 73.11 mL/min (ref >60.00)
Glucose, Bld: 94 mg/dL (ref 70–99)
Sodium: 142 mEq/L (ref 135–145)

## 2011-10-05 MED ORDER — OMEGA-3-ACID ETHYL ESTERS 1 G PO CAPS: 2.0000 g | ORAL_CAPSULE | Freq: Two times a day (BID) | ORAL | Status: DC

## 2011-10-05 NOTE — Assessment & Plan Note (Signed)
Patient has a past history of pain in the left temporomandibular joint area.  Now the TMJ feels better but she has pain down in the left ear.  She will initially consult her ENT physician.  He may also want her to see her dentist or oral surgeon again

## 2011-10-05 NOTE — Assessment & Plan Note (Signed)
A. she has a history of dyslipidemia and is on Crestor and lovasa.  Blood work is pending today.  The patient is not having any leg cramps or myalgias.

## 2011-10-05 NOTE — Progress Notes (Signed)
Quick Note:  Please report to patient. The recent labs are stable. Continue same medication and careful diet. ______ 

## 2011-10-05 NOTE — Progress Notes (Signed)
Amber [name] Date of Birth:  1933-06-05 [facility] [address] [place], [place]  [address] [phone]         Fax   [phone]  History of Present Illness: This pleasant 78 year old woman is seen for a scheduled followup office visit.  She has a history of essential hypertension.  She also has hypercholesterolemia.  His remote history of colon cancer without recurrence.  She's had problems with TMJ affecting the left side of her face and she's had some left ear pain as well  Current Outpatient Prescriptions  Medication Sig Dispense Refill  . amLODipine (NORVASC) 10 MG tablet TAKE 1 TABLET BY MOUTH EVERY DAY  90 tablet  3  . atenolol (TENORMIN) 25 MG tablet Take 1 tablet (25 mg total) by mouth daily.  90 tablet  3  . losartan (COZAAR) 50 MG tablet Take 1 tablet (50 mg total) by mouth daily.  90 tablet  3  . Multiple Vitamin (MULTIVITAMIN) tablet Take 1 tablet by mouth daily.        Marland Kitchen omega-3 acid ethyl esters (LOVAZA) 1 G capsule Take 2 g by mouth 2 (two) times daily.        . rosuvastatin (CRESTOR) 5 MG tablet Take 1 tablet (5 mg total) by mouth daily.  30 tablet  5    Allergies  Allergen Reactions  . Ace Inhibitors Cough  . Escitalopram Oxalate   . Lipitor (Atorvastatin Calcium)   . Zetia (Ezetimibe)   . Zocor (Simvastatin - High Dose)     Patient Active Problem List  Diagnosis  . Essential hypertension, benign  . Pure hypercholesterolemia  . Back pain  . TMJ click  . History of colon cancer  . Stress incontinence, female  . History of Clostridium difficile infection  . S/P cataract extraction  . Benign hypertensive heart disease without heart failure  . Fatigue  . History of colon cancer, stage II    History  Smoking status  . Never Smoker   Smokeless tobacco  . Not on file    History  Alcohol Use No    Family History  Problem Relation Age of Onset  . Cancer Mother     breast cancer  . Breast cancer Mother   . Cancer  Father     colon cancer  . Stroke Father   . Colon cancer Father   . Cancer Brother     bladder cancer  . Cancer Brother     throat cancer    Review of Systems: Constitutional: no fever chills diaphoresis or fatigue or change in weight.  Head and neck: no hearing loss, no epistaxis, no photophobia or visual disturbance. Respiratory: No cough, shortness of breath or wheezing. Cardiovascular: No chest pain peripheral edema, palpitations. Gastrointestinal: No abdominal distention, no abdominal pain, no change in bowel habits hematochezia or melena. Genitourinary: No dysuria, no frequency, no urgency, no nocturia. Musculoskeletal:No arthralgias, no back pain, no gait disturbance or myalgias. Neurological: No dizziness, no headaches, no numbness, no seizures, no syncope, no weakness, no tremors. Hematologic: No lymphadenopathy, no easy bruising. Psychiatric: No confusion, no hallucinations, no sleep disturbance.    Physical Exam: Filed Vitals:   10/05/11 0933  BP: 120/74  Pulse: 60   the general appearance reveals a well-developed well-nourished woman in no distress.The head and neck exam reveals pupils equal and reactive.  Extraocular movements are full.  There is no scleral icterus.  The mouth and pharynx are normal.  The neck is  supple.  The carotids reveal no bruits.  The jugular venous pressure is normal.  The  thyroid is not enlarged.  There is no lymphadenopathy.  The chest is clear to percussion and auscultation.  There are no rales or rhonchi.  Expansion of the chest is symmetrical.  The precordium is quiet.  The first heart sound is normal.  The second heart sound is physiologically split.  There is no murmur gallop rub or click.  There is no abnormal lift or heave.  The abdomen is soft and nontender.  The bowel sounds are normal.  The liver and spleen are not enlarged.  There are no abdominal masses.  There are no abdominal bruits.  Extremities reveal good pedal pulses.  There is  no phlebitis or edema.  There is no cyanosis or clubbing.  Strength is normal and symmetrical in all extremities.  There is no lateralizing weakness.  There are no sensory deficits.  The skin is warm and dry.  There is no rash.     Assessment / Plan: Continue same medication.  Recheck in 4 months for followup office visit lipid panel hepatic function panel and basal metabolic panel

## 2011-10-05 NOTE — Assessment & Plan Note (Signed)
At her last visit we added losartan 50 mg one daily to her regimen.  Her blood pressure is in the normal range now.  She is tolerating the drug without cough or other side effects

## 2011-10-05 NOTE — Patient Instructions (Signed)
Your physician recommends that you continue on your current medications as directed. Please refer to the Current Medication list given to you today.  Your physician recommends that you schedule a follow-up appointment in: 4 months with fasting labs (lp/bmet/hfp)   Will obtain labs today and call you with the results    

## 2011-10-05 NOTE — Telephone Encounter (Signed)
Refilled lovaza.

## 2011-10-06 ENCOUNTER — Telehealth: Payer: Self-pay | Admitting: *Deleted

## 2011-10-06 NOTE — Telephone Encounter (Signed)
Advised patient of lab results  

## 2011-10-06 NOTE — Telephone Encounter (Signed)
Message copied by Burnell Blanks on Thu Oct 06, 2011  3:05 PM ------      Message from: Cassell Clement      Created: Wed Oct 05, 2011  8:38 PM       Please report to patient.  The recent labs are stable. Continue same medication and careful diet.

## 2011-10-17 ENCOUNTER — Encounter: Payer: Self-pay | Admitting: Family Medicine

## 2011-10-17 DIAGNOSIS — Z1231 Encounter for screening mammogram for malignant neoplasm of breast: Secondary | ICD-10-CM | POA: Diagnosis not present

## 2011-10-18 ENCOUNTER — Encounter: Payer: Self-pay | Admitting: *Deleted

## 2011-12-15 ENCOUNTER — Other Ambulatory Visit: Payer: Self-pay | Admitting: Cardiology

## 2011-12-15 DIAGNOSIS — Z23 Encounter for immunization: Secondary | ICD-10-CM | POA: Diagnosis not present

## 2011-12-20 ENCOUNTER — Other Ambulatory Visit: Payer: Self-pay | Admitting: Cardiology

## 2012-02-07 ENCOUNTER — Encounter: Payer: Self-pay | Admitting: Cardiology

## 2012-02-07 ENCOUNTER — Other Ambulatory Visit (INDEPENDENT_AMBULATORY_CARE_PROVIDER_SITE_OTHER): Payer: Medicare Other

## 2012-02-07 ENCOUNTER — Ambulatory Visit (INDEPENDENT_AMBULATORY_CARE_PROVIDER_SITE_OTHER): Payer: Medicare Other | Admitting: Cardiology

## 2012-02-07 VITALS — BP 124/72 | HR 64 | Ht 63.0 in | Wt 137.0 lb

## 2012-02-07 DIAGNOSIS — I119 Hypertensive heart disease without heart failure: Secondary | ICD-10-CM | POA: Diagnosis not present

## 2012-02-07 DIAGNOSIS — M26629 Arthralgia of temporomandibular joint, unspecified side: Secondary | ICD-10-CM

## 2012-02-07 DIAGNOSIS — M2669 Other specified disorders of temporomandibular joint: Secondary | ICD-10-CM

## 2012-02-07 DIAGNOSIS — E7800 Pure hypercholesterolemia, unspecified: Secondary | ICD-10-CM | POA: Diagnosis not present

## 2012-02-07 LAB — HEPATIC FUNCTION PANEL
AST: 18 U/L (ref 0–37)
Alkaline Phosphatase: 77 U/L (ref 39–117)
Total Bilirubin: 0.6 mg/dL (ref 0.3–1.2)

## 2012-02-07 LAB — LIPID PANEL
LDL Cholesterol: 78 mg/dL (ref 0–99)
VLDL: 14.6 mg/dL (ref 0.0–40.0)

## 2012-02-07 LAB — BASIC METABOLIC PANEL
Chloride: 103 mEq/L (ref 96–112)
GFR: 75.18 mL/min (ref >60.00)
Glucose, Bld: 106 mg/dL — ABNORMAL HIGH (ref 70–99)
Potassium: 4 mEq/L (ref 3.5–5.1)
Sodium: 139 mEq/L (ref 135–145)

## 2012-02-07 NOTE — Progress Notes (Signed)
Amber [name] Date of Birth:  Nov 25, 1933 [facility] [address] [place], [place]  [address] [phone]  Fax   [phone]  HPI: This pleasant 78 year old woman is seen for a scheduled followup office visit. She has a history of essential hypertension. She also has hypercholesterolemia.  She has a remote history of colon cancer without recurrence. She's had problems with TMJ affecting the left side of her face and she's had some left ear pain as well.   Current Outpatient Prescriptions  Medication Sig Dispense Refill  . amLODipine (NORVASC) 10 MG tablet TAKE 1 TABLET BY MOUTH EVERY DAY  90 tablet  2  . atenolol (TENORMIN) 25 MG tablet Take 1 tablet (25 mg total) by mouth daily.  90 tablet  3  . losartan (COZAAR) 50 MG tablet Take 1 tablet (50 mg total) by mouth daily.  90 tablet  3  . Multiple Vitamin (MULTIVITAMIN) tablet Take 1 tablet by mouth daily.        Marland Kitchen omega-3 acid ethyl esters (LOVAZA) 1 G capsule Take 2 capsules (2 g total) by mouth 2 (two) times daily.  360 capsule  3  . rosuvastatin (CRESTOR) 5 MG tablet Take 1 tablet (5 mg total) by mouth daily.  30 tablet  5    Allergies  Allergen Reactions  . Ace Inhibitors Cough  . Escitalopram Oxalate   . Lipitor (Atorvastatin Calcium)   . Zetia (Ezetimibe)   . Zocor (Simvastatin - High Dose)     Patient Active Problem List  Diagnosis  . Essential hypertension, benign  . Pure hypercholesterolemia  . Back pain  . TMJ click  . History of colon cancer  . Stress incontinence, female  . History of Clostridium difficile infection  . S/P cataract extraction  . Benign hypertensive heart disease without heart failure  . Fatigue  . History of colon cancer, stage II    History  Smoking status  . Never Smoker   Smokeless tobacco  . Not on file    History  Alcohol Use No    Family History  Problem Relation Age of Onset  . Cancer Mother     breast cancer  . Breast cancer Mother   . Cancer  Father     colon cancer  . Stroke Father   . Colon cancer Father   . Cancer Brother     bladder cancer  . Cancer Brother     throat cancer    Review of Systems: The patient denies any heat or cold intolerance.  No weight gain or weight loss.  The patient denies headaches or blurry vision.  There is no cough or sputum production.  The patient denies dizziness.  There is no hematuria or hematochezia.  The patient denies any muscle aches or arthritis.  The patient denies any rash.  The patient denies frequent falling or instability.  There is no history of depression or anxiety.  All other systems were reviewed and are negative.   Physical Exam: Filed Vitals:   02/07/12 0857  BP: 124/72  Pulse: 64   general appearance reveals a well-developed well-nourished woman in no distress.The head and neck exam reveals pupils equal and reactive.  Extraocular movements are full.  There is no scleral icterus.  The mouth and pharynx are normal.  The neck is supple.  The carotids reveal no bruits.  The jugular venous pressure is normal.  The  thyroid is not enlarged.  There is no lymphadenopathy.  The chest  is clear to percussion and auscultation.  There are no rales or rhonchi.  Expansion of the chest is symmetrical.  The precordium is quiet.  The first heart sound is normal.  The second heart sound is physiologically split.  There is no murmur gallop rub or click.  There is no abnormal lift or heave.  The abdomen is soft and nontender.  The bowel sounds are normal.  The liver and spleen are not enlarged.  There are no abdominal masses.  There are no abdominal bruits.  Extremities reveal good pedal pulses.  There is no phlebitis or edema.  There is no cyanosis or clubbing.  Strength is normal and symmetrical in all extremities.  There is no lateralizing weakness.  There are no sensory deficits.  The skin is warm and dry.  There is no rash.     Assessment / Plan:  Same medication.  Recheck in 4 months for  followup office visit lipid panel hepatic function panel and basal metabolic panel

## 2012-02-07 NOTE — Assessment & Plan Note (Signed)
Her blood pressure has been remaining good on current medication.  Is on atenolol, losartan, and amlodipine.  She has not been having dizzy spells or syncope.  No chest pain or angina.  No symptoms of CHF

## 2012-02-07 NOTE — Patient Instructions (Addendum)
Your physician recommends that you continue on your current medications as directed. Please refer to the Current Medication list given to you today.  Your physician wants you to follow-up in: 4 months with fasting labs (lp/bmet/hfp) You will receive a reminder letter in the mail two months in advance. If you don't receive a letter, please call our office to schedule the follow-up appointment.   Will obtain labs today and call you with the results (lp/bmet/hfp)  

## 2012-02-07 NOTE — Assessment & Plan Note (Signed)
The patient reports that her TMJ syndrome discomfort on the left side has improved slightly.  She has not yet gone to see an Transport planner.  She takes only occasional pain medication.

## 2012-02-07 NOTE — Assessment & Plan Note (Signed)
Patient has a history of hypercholesterolemia and is on Crestor and lovasa.  She's not having any side effects from the statin therapy.  Since last visit her weight is up 2 pounds.

## 2012-02-08 NOTE — Progress Notes (Signed)
Quick Note:  Please report to patient. The recent labs are stable. Continue same medication and careful diet. Sugar is slightly high at 106. Watch carbohydrates and sweets. ______

## 2012-02-14 ENCOUNTER — Telehealth: Payer: Self-pay | Admitting: *Deleted

## 2012-02-14 NOTE — Telephone Encounter (Signed)
Message copied by Burnell Blanks on Tue Feb 14, 2012  5:46 PM ------      Message from: Cassell Clement      Created: Wed Feb 08, 2012  7:24 PM       Please report to patient.  The recent labs are stable. Continue same medication and careful diet.  Sugar is slightly high at 106.  Watch carbohydrates and sweets.

## 2012-02-14 NOTE — Telephone Encounter (Signed)
No answer, mailed copy and highlighted comments made by  Dr. Patty Sermons

## 2012-03-29 ENCOUNTER — Other Ambulatory Visit: Payer: Self-pay

## 2012-03-29 DIAGNOSIS — E7800 Pure hypercholesterolemia, unspecified: Secondary | ICD-10-CM

## 2012-03-29 MED ORDER — ROSUVASTATIN CALCIUM 5 MG PO TABS: 5.0000 mg | ORAL_TABLET | Freq: Every day | ORAL | Status: DC

## 2012-07-26 ENCOUNTER — Ambulatory Visit: Payer: Medicare Other | Admitting: Cardiology

## 2012-08-02 ENCOUNTER — Ambulatory Visit (INDEPENDENT_AMBULATORY_CARE_PROVIDER_SITE_OTHER): Payer: Medicare Other | Admitting: Cardiology

## 2012-08-02 ENCOUNTER — Other Ambulatory Visit (INDEPENDENT_AMBULATORY_CARE_PROVIDER_SITE_OTHER): Payer: Self-pay

## 2012-08-02 ENCOUNTER — Encounter: Payer: Self-pay | Admitting: Cardiology

## 2012-08-02 VITALS — BP 120/68 | HR 64 | Ht 62.0 in | Wt 136.0 lb

## 2012-08-02 DIAGNOSIS — R5381 Other malaise: Secondary | ICD-10-CM

## 2012-08-02 DIAGNOSIS — E7800 Pure hypercholesterolemia, unspecified: Secondary | ICD-10-CM

## 2012-08-02 DIAGNOSIS — R0989 Other specified symptoms and signs involving the circulatory and respiratory systems: Secondary | ICD-10-CM

## 2012-08-02 DIAGNOSIS — I119 Hypertensive heart disease without heart failure: Secondary | ICD-10-CM | POA: Diagnosis not present

## 2012-08-02 DIAGNOSIS — R5383 Other fatigue: Secondary | ICD-10-CM

## 2012-08-02 LAB — BASIC METABOLIC PANEL
CO2: 31 mEq/L (ref 19–32)
Calcium: 9.4 mg/dL (ref 8.4–10.5)
Chloride: 106 mEq/L (ref 96–112)
Potassium: 4.2 mEq/L (ref 3.5–5.1)
Sodium: 140 mEq/L (ref 135–145)

## 2012-08-02 LAB — LIPID PANEL
HDL: 54.4 mg/dL (ref >39.00)
LDL Cholesterol: 68 mg/dL (ref 0–99)
Total CHOL/HDL Ratio: 3
Triglycerides: 98 mg/dL (ref 0.0–149.0)

## 2012-08-02 LAB — CBC WITH DIFFERENTIAL/PLATELET
Basophils Relative: 0.8 % (ref 0.0–3.0)
Eosinophils Relative: 2.2 % (ref 0.0–5.0)
Lymphocytes Relative: 40 % (ref 12.0–46.0)
Monocytes Relative: 5.2 % (ref 3.0–12.0)
Neutrophils Relative %: 51.8 % (ref 43.0–77.0)
RBC: 4.39 Mil/uL (ref 3.87–5.11)
WBC: 6.7 10*3/uL (ref 4.5–10.5)

## 2012-08-02 LAB — HEPATIC FUNCTION PANEL
AST: 14 U/L (ref 0–37)
Albumin: 4 g/dL (ref 3.5–5.2)

## 2012-08-02 NOTE — Progress Notes (Signed)
Amber [name] Date of Birth:  1933-04-21 [facility] [address] [place], [place]  [address] [phone]         Fax   [phone]  History of Present Illness: This pleasant 79 year old woman is seen for a scheduled followup office visit. She has a history of essential hypertension. She also has hypercholesterolemia. She has a remote history of colon cancer without recurrence.  She reports that since we last saw her she received her shingles shot as we had recommended.  Her husband recently had painful shingles.  Current Outpatient Prescriptions  Medication Sig Dispense Refill  . amLODipine (NORVASC) 10 MG tablet TAKE 1 TABLET BY MOUTH EVERY DAY  90 tablet  2  . atenolol (TENORMIN) 25 MG tablet Take 1 tablet (25 mg total) by mouth daily.  90 tablet  3  . Multiple Vitamin (MULTIVITAMIN) tablet Take 1 tablet by mouth daily.        Marland Kitchen omega-3 acid ethyl esters (LOVAZA) 1 G capsule Take 2 capsules (2 g total) by mouth 2 (two) times daily.  360 capsule  3  . rosuvastatin (CRESTOR) 5 MG tablet Take 1 tablet (5 mg total) by mouth daily.  30 tablet  5  . losartan (COZAAR) 50 MG tablet Take 1 tablet (50 mg total) by mouth daily.  90 tablet  3   No current facility-administered medications for this visit.    Allergies  Allergen Reactions  . Ace Inhibitors Cough  . Escitalopram Oxalate   . Lipitor (Atorvastatin Calcium)   . Zetia (Ezetimibe)   . Zocor (Simvastatin - High Dose)     Patient Active Problem List   Diagnosis Date Noted  . TMJ syndrome 02/07/2012  . Fatigue 06/06/2011  . Benign hypertensive heart disease without heart failure 10/18/2010  . Essential hypertension, benign 06/22/2010  . Pure hypercholesterolemia 06/22/2010  . Back pain 06/22/2010  . TMJ click 06/22/2010  . History of colon cancer 06/22/2010  . Stress incontinence, female 06/22/2010  . History of Clostridium difficile infection 06/22/2010  . S/P cataract extraction 06/22/2010  .  History of colon cancer, stage II 05/27/2008    History  Smoking status  . Never Smoker   Smokeless tobacco  . Not on file    History  Alcohol Use No    Family History  Problem Relation Age of Onset  . Cancer Mother     breast cancer  . Breast cancer Mother   . Cancer Father     colon cancer  . Stroke Father   . Colon cancer Father   . Cancer Brother     bladder cancer  . Cancer Brother     throat cancer    Review of Systems: Constitutional: no fever chills diaphoresis or fatigue or change in weight.  Head and neck: no hearing loss, no epistaxis, no photophobia or visual disturbance. Respiratory: No cough, shortness of breath or wheezing. Cardiovascular: No chest pain peripheral edema, palpitations. Gastrointestinal: No abdominal distention, no abdominal pain, no change in bowel habits hematochezia or melena. Genitourinary: No dysuria, no frequency, no urgency, no nocturia. Musculoskeletal:No arthralgias, no back pain, no gait disturbance or myalgias. Neurological: No dizziness, no headaches, no numbness, no seizures, no syncope, no weakness, no tremors. Hematologic: No lymphadenopathy, no easy bruising. Psychiatric: No confusion, no hallucinations, no sleep disturbance.    Physical Exam: Filed Vitals:   08/02/12 0949  BP: 120/68  Pulse: 64   the general appearance reveals a well-developed well-nourished woman in no  distress.The head and neck exam reveals pupils equal and reactive.  Extraocular movements are full.  There is no scleral icterus.  The mouth and pharynx are normal.  The neck is supple.  The carotids reveal no bruits.  The jugular venous pressure is normal.  The  thyroid is not enlarged.  There is no lymphadenopathy.  The chest is clear to percussion and auscultation.  There are no rales or rhonchi.  Expansion of the chest is symmetrical.  The precordium is quiet.  The first heart sound is normal.  The second heart sound is physiologically split.  There is  no murmur gallop rub or click.  There is no abnormal lift or heave.  The abdomen is soft and nontender.  The bowel sounds are normal.  The liver and spleen are not enlarged.  There are no abdominal masses.  There are no abdominal bruits.  Extremities reveal good pedal pulses.  There is no phlebitis or edema.  There is no cyanosis or clubbing.  Strength is normal and symmetrical in all extremities.  There is no lateralizing weakness.  There are no sensory deficits.  The skin is warm and dry.  There is no rash.     Assessment / Plan: Continue same medication.  Await results of today's lab work.  Recheck in 4 months for office visit CBC lipid panel hepatic function panel and basal metabolic panel.

## 2012-08-02 NOTE — Patient Instructions (Signed)
Will obtain labs today and call you with the results (lp/bmet/hfp/cbc)  Your physician recommends that you continue on your current medications as directed. Please refer to the Current Medication list given to you today.  Your physician wants you to follow-up in: 4 months with fasting labs (lp/bmet/hfp/cbc) You will receive a reminder letter in the mail two months in advance. If you don't receive a letter, please call our office to schedule the follow-up appointment.  

## 2012-08-02 NOTE — Assessment & Plan Note (Signed)
The patient has a past history of hypercholesterolemia.  She is on statin therapy.  She's not having any myalgias.

## 2012-08-02 NOTE — Assessment & Plan Note (Signed)
The patient has not been experiencing any chest pain or angina.  No symptoms of CHF.  Blood pressure remaining stable on current therapy.

## 2012-08-02 NOTE — Assessment & Plan Note (Signed)
Patient complains of fatigue.  Her energy level is not that she was likely to be.  We will check CBCs and she has a past history of colon cancer.  She is due for a colonoscopy with Dr. Randa Evens sound.  She has not been aware of any melena or hematochezia

## 2012-08-03 NOTE — Progress Notes (Signed)
Quick Note:  Please report to patient. The recent labs are stable. Continue same medication and careful diet. No evidence of anemia. Hemoglobin is 13.6. ______

## 2012-08-06 ENCOUNTER — Telehealth: Payer: Self-pay | Admitting: *Deleted

## 2012-08-06 NOTE — Telephone Encounter (Signed)
Message copied by Burnell Blanks on Mon Aug 06, 2012  5:13 PM ------      Message from: Cassell Clement      Created: Fri Aug 03, 2012 12:16 PM       Please report to patient.  The recent labs are stable. Continue same medication and careful diet.  No evidence of anemia.  Hemoglobin is 13.6. ------

## 2012-08-06 NOTE — Telephone Encounter (Signed)
Mailed copy of labs and left message to call if any questions  

## 2012-08-16 ENCOUNTER — Other Ambulatory Visit: Payer: Self-pay | Admitting: Cardiology

## 2012-08-28 NOTE — Progress Notes (Signed)
This encounter was created in error - please disregard.

## 2012-09-12 DIAGNOSIS — K5730 Diverticulosis of large intestine without perforation or abscess without bleeding: Secondary | ICD-10-CM | POA: Diagnosis not present

## 2012-09-12 DIAGNOSIS — Z86010 Personal history of colonic polyps: Secondary | ICD-10-CM | POA: Diagnosis not present

## 2012-09-12 DIAGNOSIS — Z85038 Personal history of other malignant neoplasm of large intestine: Secondary | ICD-10-CM | POA: Diagnosis not present

## 2012-09-12 DIAGNOSIS — Z09 Encounter for follow-up examination after completed treatment for conditions other than malignant neoplasm: Secondary | ICD-10-CM | POA: Diagnosis not present

## 2012-09-12 DIAGNOSIS — Z800 Family history of malignant neoplasm of digestive organs: Secondary | ICD-10-CM | POA: Diagnosis not present

## 2012-09-18 ENCOUNTER — Telehealth: Payer: Self-pay | Admitting: Oncology

## 2012-09-18 ENCOUNTER — Ambulatory Visit (HOSPITAL_BASED_OUTPATIENT_CLINIC_OR_DEPARTMENT_OTHER): Payer: Medicare Other | Admitting: Oncology

## 2012-09-18 ENCOUNTER — Other Ambulatory Visit (HOSPITAL_BASED_OUTPATIENT_CLINIC_OR_DEPARTMENT_OTHER): Payer: Medicare Other | Admitting: Lab

## 2012-09-18 VITALS — BP 110/66 | HR 68 | Temp 97.9°F | Resp 18 | Ht 62.0 in | Wt 137.1 lb

## 2012-09-18 DIAGNOSIS — Z85038 Personal history of other malignant neoplasm of large intestine: Secondary | ICD-10-CM

## 2012-09-18 DIAGNOSIS — C189 Malignant neoplasm of colon, unspecified: Secondary | ICD-10-CM | POA: Diagnosis not present

## 2012-09-18 NOTE — Telephone Encounter (Signed)
gv and printed appt sched and avs for pt for April 2015  °

## 2012-09-18 NOTE — Progress Notes (Signed)
   [facility]    OFFICE PROGRESS NOTE   INTERVAL HISTORY:   She returns as scheduled. She reports undergoing a colonoscopy with Dr. Randa Evens within the past few weeks. She says the next colonoscopy will be in 5 years. She feels well. She has an occasional cough in the evening. She reports intermittent difficulty with dysphagia which eats bread. This is not a consistent problem and has occurred for at least 6 months.  Objective:  Vital signs in last 24 hours:  Blood pressure 110/66, pulse 68, temperature 97.9 F (36.6 C), temperature source Oral, resp. rate 18, height 5\' 2"  (1.575 m), weight 137 lb 1.6 oz (62.188 kg).    HEENT: Pharynx without visible mass, neck without mass Lymphatics: No cervical, supraclavicular, axillary, or inguinal nodes Resp: End inspiratory rales at the extreme posterior base bilaterally, no respiratory distress Cardio: Regular rate and rhythm GI:  No hepatomegaly, no mass, nontender Vascular: No leg edema   Lab Results:  CEA pending   Medications: I have reviewed the patient's current medications.  Assessment/Plan: 1. Stage II (T3 N0) colon cancer status post right hemicolectomy 05/27/2008. 2. Family history multiple cancers, including colon cancer. 3. Indeterminate liver lesions on a preoperative CT status post MRI of the liver on 08/18/2008 confirming multiple hepatic cysts. 4. Hypertension. 5. Intermittent solid dysphagia-I recommended she followup with her primary physician or Dr. Randa Evens if this persists   Disposition:  She remains in clinical remission from colon cancer. We will followup on the recent colonoscopy and CEA from today. She will return for an office visit in April of 2015.   Thornton Papas, MD  09/18/2012  11:53 AM

## 2012-09-21 ENCOUNTER — Telehealth: Payer: Self-pay | Admitting: *Deleted

## 2012-09-21 NOTE — Telephone Encounter (Signed)
Pt notified of cea normal; verbalized understanding.

## 2012-09-21 NOTE — Telephone Encounter (Signed)
Message copied by Primus Bravo P on Fri Sep 21, 2012  3:15 PM ------      Message from: Thornton Papas B      Created: Wed Sep 19, 2012  9:39 PM       Please call patient, cea is normal ------

## 2012-09-27 ENCOUNTER — Other Ambulatory Visit: Payer: Self-pay | Admitting: Cardiology

## 2012-09-27 ENCOUNTER — Encounter: Payer: Self-pay | Admitting: Cardiology

## 2012-10-25 DIAGNOSIS — Z803 Family history of malignant neoplasm of breast: Secondary | ICD-10-CM | POA: Diagnosis not present

## 2012-10-25 DIAGNOSIS — Z1231 Encounter for screening mammogram for malignant neoplasm of breast: Secondary | ICD-10-CM | POA: Diagnosis not present

## 2012-10-26 ENCOUNTER — Encounter: Payer: Self-pay | Admitting: Family Medicine

## 2012-10-31 ENCOUNTER — Encounter: Payer: Self-pay | Admitting: *Deleted

## 2012-11-24 ENCOUNTER — Other Ambulatory Visit: Payer: Self-pay | Admitting: Cardiology

## 2012-11-28 DIAGNOSIS — Z23 Encounter for immunization: Secondary | ICD-10-CM | POA: Diagnosis not present

## 2012-12-05 ENCOUNTER — Ambulatory Visit (INDEPENDENT_AMBULATORY_CARE_PROVIDER_SITE_OTHER): Payer: Medicare Other | Admitting: *Deleted

## 2012-12-05 ENCOUNTER — Ambulatory Visit (INDEPENDENT_AMBULATORY_CARE_PROVIDER_SITE_OTHER)
Admission: RE | Admit: 2012-12-05 | Discharge: 2012-12-05 | Disposition: A | Discharge status: Home / Self Care | Payer: Medicare Other | Source: Ambulatory Visit | Attending: Cardiology | Admitting: Cardiology

## 2012-12-05 ENCOUNTER — Ambulatory Visit (INDEPENDENT_AMBULATORY_CARE_PROVIDER_SITE_OTHER): Payer: Medicare Other | Admitting: Cardiology

## 2012-12-05 ENCOUNTER — Encounter: Payer: Self-pay | Admitting: Cardiology

## 2012-12-05 VITALS — BP 136/73 | HR 58 | Ht 63.0 in | Wt 136.0 lb

## 2012-12-05 DIAGNOSIS — I119 Hypertensive heart disease without heart failure: Secondary | ICD-10-CM

## 2012-12-05 DIAGNOSIS — M549 Dorsalgia, unspecified: Secondary | ICD-10-CM

## 2012-12-05 DIAGNOSIS — R079 Chest pain, unspecified: Secondary | ICD-10-CM

## 2012-12-05 DIAGNOSIS — I10 Essential (primary) hypertension: Secondary | ICD-10-CM

## 2012-12-05 DIAGNOSIS — E7800 Pure hypercholesterolemia, unspecified: Secondary | ICD-10-CM | POA: Diagnosis not present

## 2012-12-05 DIAGNOSIS — R5381 Other malaise: Secondary | ICD-10-CM

## 2012-12-05 LAB — CBC WITH DIFFERENTIAL/PLATELET
Basophils Absolute: 0 10*3/uL (ref 0.0–0.1)
Basophils Relative: 0.4 % (ref 0.0–3.0)
Eosinophils Absolute: 0.2 10*3/uL (ref 0.0–0.7)
Hemoglobin: 13.5 g/dL (ref 12.0–15.0)
Lymphocytes Relative: 40.2 % (ref 12.0–46.0)
Lymphs Abs: 2.6 10*3/uL (ref 0.7–4.0)
MCHC: 34.4 g/dL (ref 30.0–36.0)
MCV: 89.7 fl (ref 78.0–100.0)
Monocytes Absolute: 0.5 10*3/uL (ref 0.1–1.0)
Neutrophils Relative %: 48.8 % (ref 43.0–77.0)
Platelets: 242 10*3/uL (ref 150.0–400.0)
RBC: 4.38 Mil/uL (ref 3.87–5.11)
RDW: 13.3 % (ref 11.5–14.6)

## 2012-12-05 LAB — BASIC METABOLIC PANEL
BUN: 20 mg/dL (ref 6–23)
CO2: 28 mEq/L (ref 19–32)
Calcium: 9.3 mg/dL (ref 8.4–10.5)
Creatinine, Ser: 0.9 mg/dL (ref 0.4–1.2)
Sodium: 143 mEq/L (ref 135–145)

## 2012-12-05 LAB — HEPATIC FUNCTION PANEL
ALT: 14 U/L (ref 0–35)
AST: 17 U/L (ref 0–37)
Albumin: 4.2 g/dL (ref 3.5–5.2)
Alkaline Phosphatase: 66 U/L (ref 39–117)

## 2012-12-05 LAB — LIPID PANEL
Cholesterol: 198 mg/dL (ref 0–200)
LDL Cholesterol: 125 mg/dL — ABNORMAL HIGH (ref 0–99)
Total CHOL/HDL Ratio: 4
Triglycerides: 87 mg/dL (ref 0.0–149.0)

## 2012-12-05 NOTE — Assessment & Plan Note (Signed)
Patient has been on Crestor.  No myalgias

## 2012-12-05 NOTE — Progress Notes (Signed)
Amber [name] Date of Birth:  [birth year] [facility] [address] [place], [place]  [address] [phone]         Fax   [phone]  History of Present Illness: This pleasant 79 year old woman is seen for a scheduled followup office visit. She has a history of essential hypertension. She also has hypercholesterolemia. She has a remote history of colon cancer without recurrence.  She has not been experiencing any chest pain to suggest angina.  She has been experiencing intrascapular tightness when she stands up the sink or does housework or works in her garden.  Her last chest x-ray was in 2010. Current Outpatient Prescriptions  Medication Sig Dispense Refill  . amLODipine (NORVASC) 10 MG tablet TAKE 1 TABLET BY MOUTH EVERY DAY  90 tablet  2  . atenolol (TENORMIN) 25 MG tablet TAKE 1 TABLET BY MOUTH DAILY  90 tablet  2  . losartan (COZAAR) 50 MG tablet TAKE 1 TABLET BY MOUTH DAILY  90 tablet  2  . Multiple Vitamin (MULTIVITAMIN) tablet Take 1 tablet by mouth daily.        Marland Kitchen omega-3 acid ethyl esters (LOVAZA) 1 G capsule Take 2 capsules (2 g total) by mouth 2 (two) times daily.  360 capsule  3  . rosuvastatin (CRESTOR) 5 MG tablet Take 1 tablet (5 mg total) by mouth daily.  30 tablet  5   No current facility-administered medications for this visit.    Allergies  Allergen Reactions  . Ace Inhibitors Cough  . Escitalopram Oxalate   . Lipitor [Atorvastatin Calcium]   . Zetia [Ezetimibe]   . Zocor [Simvastatin - High Dose]     Patient Active Problem List   Diagnosis Date Noted  . TMJ syndrome 02/07/2012  . Fatigue 06/06/2011  . Benign hypertensive heart disease without heart failure 10/18/2010  . Essential hypertension, benign 06/22/2010  . Pure hypercholesterolemia 06/22/2010  . Back pain 06/22/2010  . TMJ click 06/22/2010  . History of colon cancer 06/22/2010  . Stress incontinence, female 06/22/2010  . History of Clostridium difficile infection  06/22/2010  . S/P cataract extraction 06/22/2010  . History of colon cancer, stage II 05/27/2008    History  Smoking status  . Never Smoker   Smokeless tobacco  . Not on file    History  Alcohol Use No    Family History  Problem Relation Age of Onset  . Cancer Mother     breast cancer  . Breast cancer Mother   . Cancer Father     colon cancer  . Stroke Father   . Colon cancer Father   . Cancer Brother     bladder cancer  . Cancer Brother     throat cancer    Review of Systems: Constitutional: no fever chills diaphoresis or fatigue or change in weight.  Head and neck: no hearing loss, no epistaxis, no photophobia or visual disturbance. Respiratory: No cough, shortness of breath or wheezing. Cardiovascular: No chest pain peripheral edema, palpitations. Gastrointestinal: No abdominal distention, no abdominal pain, no change in bowel habits hematochezia or melena. Genitourinary: No dysuria, no frequency, no urgency, no nocturia. Musculoskeletal:No arthralgias, no back pain, no gait disturbance or myalgias. Neurological: No dizziness, no headaches, no numbness, no seizures, no syncope, no weakness, no tremors. Hematologic: No lymphadenopathy, no easy bruising. Psychiatric: No confusion, no hallucinations, no sleep disturbance.    Physical Exam: Filed Vitals:   12/05/12 0859  BP: 136/73  Pulse: 58   the  general appearance reveals a well-developed well-nourished woman in no distress.The head and neck exam reveals pupils equal and reactive.  Extraocular movements are full.  There is no scleral icterus.  The mouth and pharynx are normal.  The neck is supple.  The carotids reveal no bruits.  The jugular venous pressure is normal.  The  thyroid is not enlarged.  There is no lymphadenopathy.  The chest is clear to percussion and auscultation.  There are no rales or rhonchi.  Expansion of the chest is symmetrical.  The precordium is quiet.  The first heart sound is normal.  The  second heart sound is physiologically split.  There is no murmur gallop rub or click.  There is no abnormal lift or heave.  The abdomen is soft and nontender.  The bowel sounds are normal.  The liver and spleen are not enlarged.  There are no abdominal masses.  There are no abdominal bruits.  Extremities reveal good pedal pulses.  There is no phlebitis or edema.  There is no cyanosis or clubbing.  Strength is normal and symmetrical in all extremities.  There is no lateralizing weakness.  There are no sensory deficits.  The skin is warm and dry.  There is no rash.     Assessment / Plan: Continue same medication.  Await results of today's lab work.  Recheck in 4 months for office visit CBC lipid panel hepatic function panel and basal metabolic panel.  Update chest x-ray

## 2012-12-05 NOTE — Patient Instructions (Signed)
You need to go to the [facility] across from [facility] soon for a chest xray  Your physician recommends that you continue on your current medications as directed. Please refer to the Current Medication list given to you today.  Your physician wants you to follow-up in: 4 months with fasting labs (lp/bmet/hfp)  You will receive a reminder letter in the mail two months in advance. If you don't receive a letter, please call our office to schedule the follow-up appointment.

## 2012-12-05 NOTE — Assessment & Plan Note (Signed)
Patient has been having interscapular discomfort.  We will get chest x-ray.

## 2012-12-05 NOTE — Assessment & Plan Note (Signed)
Blood pressure has been remaining stable on current medication.  No headaches dizziness or syncope.  No palpitations.

## 2012-12-05 NOTE — Progress Notes (Signed)
Quick Note:  Please report to patient. The recent labs are stable. Continue same medication and careful diet. LDL is higher. Watch diet better.  Continue same crestor. ______

## 2012-12-06 ENCOUNTER — Telehealth: Payer: Self-pay | Admitting: *Deleted

## 2012-12-06 NOTE — Telephone Encounter (Signed)
Message copied by Burnell Blanks on Thu Dec 06, 2012 10:16 AM ------      Message from: Cassell Clement      Created: Wed Dec 05, 2012  1:13 PM       Please report.  The chest x-ray was normal. ------

## 2012-12-06 NOTE — Telephone Encounter (Signed)
Advised patient of lab results and chest xray  

## 2012-12-06 NOTE — Telephone Encounter (Signed)
Message copied by Burnell Blanks on Thu Dec 06, 2012 10:16 AM ------      Message from: Cassell Clement      Created: Wed Dec 05, 2012  8:27 PM       Please report to patient.  The recent labs are stable. Continue same medication and careful diet. LDL is higher. Watch diet better.       Continue same crestor. ------

## 2013-01-30 ENCOUNTER — Ambulatory Visit (INDEPENDENT_AMBULATORY_CARE_PROVIDER_SITE_OTHER): Payer: Medicare Other | Admitting: Family Medicine

## 2013-01-30 ENCOUNTER — Encounter: Payer: Self-pay | Admitting: Family Medicine

## 2013-01-30 VITALS — BP 140/80 | HR 87 | Temp 98.4°F | Ht 63.0 in | Wt 136.5 lb

## 2013-01-30 DIAGNOSIS — R350 Frequency of micturition: Secondary | ICD-10-CM | POA: Diagnosis not present

## 2013-01-30 DIAGNOSIS — N390 Urinary tract infection, site not specified: Secondary | ICD-10-CM

## 2013-01-30 LAB — POCT URINALYSIS DIPSTICK
Glucose, UA: NEGATIVE
Nitrite, UA: POSITIVE
Urobilinogen, UA: 0.2

## 2013-01-30 MED ORDER — SULFAMETHOXAZOLE-TMP DS 800-160 MG PO TABS: 1.0000 | ORAL_TABLET | Freq: Two times a day (BID) | ORAL | Status: DC

## 2013-01-30 NOTE — Progress Notes (Signed)
Pre-visit discussion using our clinic review tool. No additional management support is needed unless otherwise documented below in the visit note.  

## 2013-01-30 NOTE — Progress Notes (Signed)
Date:  01/30/2013   Name:  Amber [name]   DOB:  05/01/1933   MRN:  [id number] Gender: female Age: 79 y.o.  PCP:  Crawford Givens, MD   History of Present Illness:  Patient presents with burning, urgency. No vaginal discharge or external irritation.  No STD exposure. No abd pain, no flank pain.  Patient Active Problem List   Diagnosis Date Noted  . TMJ syndrome 02/07/2012  . Fatigue 06/06/2011  . Benign hypertensive heart disease without heart failure 10/18/2010  . Essential hypertension, benign 06/22/2010  . Pure hypercholesterolemia 06/22/2010  . Back pain 06/22/2010  . TMJ click 06/22/2010  . History of colon cancer 06/22/2010  . Stress incontinence, female 06/22/2010  . History of Clostridium difficile infection 06/22/2010  . S/P cataract extraction 06/22/2010  . History of colon cancer, stage II 05/27/2008    Past Medical History  Diagnosis Date  . Anemia   . Diverticulitis   . Urinary incontinence     stress incont.  . Urinary tract infection   . Blood transfusion without reported diagnosis     during treatment for colon CA  . Cancer     Colon, s/p partial colectomy, no chemo or radiation  . Hyperlipidemia   . Hypertension   . C. difficile colitis     history of    Past Surgical History  Procedure Laterality Date  . Abdominal hysterectomy    . Colon surgery    . Bladder suspension    . Cataract extraction    . Cardiovascular stress test  11/02/2004    EF 76%    History   Social History  . Marital Status: Married    Spouse Name: N/A    Number of Children: N/A  . Years of Education: N/A   Occupational History  . Not on file.   Social History Main Topics  . Smoking status: Never Smoker   . Smokeless tobacco: Never Used  . Alcohol Use: No  . Drug Use: No  . Sexual Activity: Not on file   Other Topics Concern  . Not on file   Social History Narrative   Married 1957   1 son    Family History  Problem Relation Age of Onset  . Cancer  Mother     breast cancer  . Breast cancer Mother   . Cancer Father     colon cancer  . Stroke Father   . Colon cancer Father   . Cancer Brother     bladder cancer  . Cancer Brother     throat cancer    Allergies  Allergen Reactions  . Ace Inhibitors Cough  . Escitalopram Oxalate   . Lipitor [Atorvastatin Calcium]   . Zetia [Ezetimibe]   . Zocor [Simvastatin - High Dose]     Medication list reviewed and updated in full in [place] [facility].  ROS: GEN:  no fevers, chills. GI: No n/v/d, eating normally Otherwise, ROS is as per the HPI.  PHYSICAL EXAM  Filed Vitals:   01/30/13 1111  BP: 140/80  Pulse: 87  Temp: 98.4 F (36.9 C)  TempSrc: Oral  Height: 5\' 3"  (1.6 m)  Weight: 136 lb 8 oz (61.916 kg)    GEN: WDWN, A&Ox4,NAD. Non-toxic HEENT: Atraumatc, normocephalic. CV: RRR, No M/G/R PULM: CTA B, No wheezes, crackles, or rhonchi ABD: S, NT, ND, +BS, no rebound. No CVAT. + suprapubic tenderness. EXT: No c/c/e  Objective Data: Results for orders placed in visit on 01/30/13  POCT URINALYSIS DIPSTICK      Result Value Range   Color, UA yellow     Clarity, UA clear     Glucose, UA negative     Bilirubin, UA negative     Ketones, UA negative     Spec Grav, UA 1.020     Blood, UA trace-non hemolyzed     pH, UA 6.0     Protein, UA 1+     Urobilinogen, UA 0.2     Nitrite, UA positive     Leukocytes, UA moderate (2+)      A/P: UTI. Rx with ABX as below   UTI (urinary tract infection) - Plan: Urine culture, sulfamethoxazole-trimethoprim (BACTRIM DS) 800-160 MG per tablet  Urinary frequency - Plan: POCT Urinalysis Dipstick  There are no Patient Instructions on file for this visit.  Orders Today:  Orders Placed This Encounter  Procedures  . Urine culture  . POCT Urinalysis Dipstick    New medications, updates to list, dose adjustments: Meds ordered this encounter  Medications  . sulfamethoxazole-trimethoprim (BACTRIM DS) 800-160 MG per tablet    Sig:  Take 1 tablet by mouth 2 (two) times daily.    Dispense:  14 tablet    Refill:  0    Signed,  Tamanna Whitson T. Theia Dezeeuw, MD, CAQ Sports Medicine  [facility] [address] [place] [address] Phone: [phone] Fax: [phone]  Updated Complete Medication List:   Medication List       This list is accurate as of: 01/30/13 12:49 PM.  Always use your most recent med list.               amLODipine 10 MG tablet  Commonly known as:  NORVASC  TAKE 1 TABLET BY MOUTH EVERY DAY     atenolol 25 MG tablet  Commonly known as:  TENORMIN  TAKE 1 TABLET BY MOUTH DAILY     losartan 50 MG tablet  Commonly known as:  COZAAR  TAKE 1 TABLET BY MOUTH DAILY     multivitamin tablet  Take 1 tablet by mouth daily.     omega-3 acid ethyl esters 1 G capsule  Commonly known as:  LOVAZA  Take 2 capsules (2 g total) by mouth 2 (two) times daily.     rosuvastatin 5 MG tablet  Commonly known as:  CRESTOR  Take 1 tablet (5 mg total) by mouth daily.     sulfamethoxazole-trimethoprim 800-160 MG per tablet  Commonly known as:  BACTRIM DS  Take 1 tablet by mouth 2 (two) times daily.

## 2013-02-01 LAB — URINE CULTURE: Colony Count: >=100000

## 2013-04-09 ENCOUNTER — Encounter: Payer: Self-pay | Admitting: Cardiology

## 2013-04-09 ENCOUNTER — Ambulatory Visit: Payer: Medicare Other | Admitting: Cardiology

## 2013-04-09 ENCOUNTER — Ambulatory Visit (INDEPENDENT_AMBULATORY_CARE_PROVIDER_SITE_OTHER): Payer: Medicare Other | Admitting: Cardiology

## 2013-04-09 VITALS — BP 112/74 | HR 64 | Ht 63.0 in | Wt 134.0 lb

## 2013-04-09 DIAGNOSIS — R5383 Other fatigue: Secondary | ICD-10-CM

## 2013-04-09 DIAGNOSIS — I119 Hypertensive heart disease without heart failure: Secondary | ICD-10-CM

## 2013-04-09 DIAGNOSIS — R5381 Other malaise: Secondary | ICD-10-CM | POA: Diagnosis not present

## 2013-04-09 DIAGNOSIS — M2669 Other specified disorders of temporomandibular joint: Secondary | ICD-10-CM

## 2013-04-09 DIAGNOSIS — E7800 Pure hypercholesterolemia, unspecified: Secondary | ICD-10-CM

## 2013-04-09 DIAGNOSIS — Z85038 Personal history of other malignant neoplasm of large intestine: Secondary | ICD-10-CM

## 2013-04-09 DIAGNOSIS — M26629 Arthralgia of temporomandibular joint, unspecified side: Secondary | ICD-10-CM

## 2013-04-09 LAB — BASIC METABOLIC PANEL
BUN: 11 mg/dL (ref 6–23)
CHLORIDE: 107 meq/L (ref 96–112)
CO2: 28 mEq/L (ref 19–32)
Calcium: 9.1 mg/dL (ref 8.4–10.5)
Creatinine, Ser: 0.8 mg/dL (ref 0.4–1.2)
GFR: 79.58 mL/min (ref >60.00)
Glucose, Bld: 103 mg/dL — ABNORMAL HIGH (ref 70–99)
Potassium: 3.9 mEq/L (ref 3.5–5.1)
Sodium: 141 mEq/L (ref 135–145)

## 2013-04-09 LAB — HEPATIC FUNCTION PANEL
ALBUMIN: 4 g/dL (ref 3.5–5.2)
ALT: 13 U/L (ref 0–35)
AST: 17 U/L (ref 0–37)
Alkaline Phosphatase: 74 U/L (ref 39–117)
BILIRUBIN DIRECT: 0.1 mg/dL (ref 0.0–0.3)
TOTAL PROTEIN: 7.2 g/dL (ref 6.0–8.3)
Total Bilirubin: 0.9 mg/dL (ref 0.3–1.2)

## 2013-04-09 LAB — LIPID PANEL
CHOLESTEROL: 183 mg/dL (ref 0–200)
HDL: 53.2 mg/dL (ref >39.00)
LDL CALC: 110 mg/dL — AB (ref 0–99)
Total CHOL/HDL Ratio: 3
Triglycerides: 101 mg/dL (ref 0.0–149.0)
VLDL: 20.2 mg/dL (ref 0.0–40.0)

## 2013-04-09 NOTE — Assessment & Plan Note (Signed)
Blood pressure was remaining stable on current therapy.  No headaches dizziness or syncope.

## 2013-04-09 NOTE — Assessment & Plan Note (Signed)
The patient is on Crestor for her hypercholesterolemia.  No side effects from the statins.  Blood work is pending

## 2013-04-09 NOTE — Patient Instructions (Signed)
Will obtain labs today and call you with the results (lp.bmet/hfp)  Your physician recommends that you continue on your current medications as directed. Please refer to the Current Medication list given to you today.  Your physician recommends that you schedule a follow-up appointment in: 4 months with fasting labs (lp/bmet/hfp/cbc)  

## 2013-04-09 NOTE — Progress Notes (Signed)
Amber [name] Date of Birth:  [birth year] [address] [place], [place]  [address] [phone]         Fax   [phone]  History of Present Illness: This pleasant 80 year old woman is seen for a scheduled followup office visit. She has a history of essential hypertension. She also has hypercholesterolemia. She has a remote history of colon cancer without recurrence.  She has not been experiencing any chest pain to suggest angina.  She has been experiencing intrascapular tightness when she stands up the sink or does housework or works in her garden.  At her last visit she was having problems with TMJ.  It has improved when she started sleeping on her other side in bed. Current Outpatient Prescriptions  Medication Sig Dispense Refill  . amLODipine (NORVASC) 10 MG tablet TAKE 1 TABLET BY MOUTH EVERY DAY  90 tablet  2  . atenolol (TENORMIN) 25 MG tablet TAKE 1 TABLET BY MOUTH DAILY  90 tablet  2  . losartan (COZAAR) 50 MG tablet TAKE 1 TABLET BY MOUTH DAILY  90 tablet  2  . Multiple Vitamin (MULTIVITAMIN) tablet Take 1 tablet by mouth daily.        Marland Kitchen omega-3 acid ethyl esters (LOVAZA) 1 G capsule Take 2 capsules (2 g total) by mouth 2 (two) times daily.  360 capsule  3  . rosuvastatin (CRESTOR) 5 MG tablet Take 1 tablet (5 mg total) by mouth daily.  30 tablet  5   No current facility-administered medications for this visit.    Allergies  Allergen Reactions  . Ace Inhibitors Cough  . Escitalopram Oxalate   . Lipitor [Atorvastatin Calcium]   . Zetia [Ezetimibe]   . Zocor [Simvastatin - High Dose]     Patient Active Problem List   Diagnosis Date Noted  . TMJ syndrome 02/07/2012  . Fatigue 06/06/2011  . Benign hypertensive heart disease without heart failure 10/18/2010  . Essential hypertension, benign 06/22/2010  . Pure hypercholesterolemia 06/22/2010  . Back pain 06/22/2010  . TMJ click 95/10/3265  . History of colon cancer 06/22/2010  . Stress incontinence,  female 06/22/2010  . History of Clostridium difficile infection 06/22/2010  . S/P cataract extraction 06/22/2010  . History of colon cancer, stage II 05/27/2008    History  Smoking status  . Never Smoker   Smokeless tobacco  . Never Used    History  Alcohol Use No    Family History  Problem Relation Age of Onset  . Cancer Mother     breast cancer  . Breast cancer Mother   . Cancer Father     colon cancer  . Stroke Father   . Colon cancer Father   . Cancer Brother     bladder cancer  . Cancer Brother     throat cancer    Review of Systems: Constitutional: no fever chills diaphoresis or fatigue or change in weight.  Head and neck: no hearing loss, no epistaxis, no photophobia or visual disturbance. Respiratory: No cough, shortness of breath or wheezing. Cardiovascular: No chest pain peripheral edema, palpitations. Gastrointestinal: No abdominal distention, no abdominal pain, no change in bowel habits hematochezia or melena. Genitourinary: No dysuria, no frequency, no urgency, no nocturia. Musculoskeletal:No arthralgias, no back pain, no gait disturbance or myalgias. Neurological: No dizziness, no headaches, no numbness, no seizures, no syncope, no weakness, no tremors. Hematologic: No lymphadenopathy, no easy bruising. Psychiatric: No confusion, no hallucinations, no sleep disturbance.    Physical Exam:  Filed Vitals:   04/09/13 0854  BP: 112/74  Pulse: 64   the general appearance reveals a well-developed well-nourished woman in no distress.The head and neck exam reveals pupils equal and reactive.  Extraocular movements are full.  There is no scleral icterus.  The mouth and pharynx are normal.  The neck is supple.  The carotids reveal no bruits.  The jugular venous pressure is normal.  The  thyroid is not enlarged.  There is no lymphadenopathy.  The chest is clear to percussion and auscultation.  There are no rales or rhonchi.  Expansion of the chest is symmetrical.   The precordium is quiet.  The first heart sound is normal.  The second heart sound is physiologically split.  There is no murmur gallop rub or click.  There is no abnormal lift or heave.  The abdomen is soft and nontender.  The bowel sounds are normal.  The liver and spleen are not enlarged.  There are no abdominal masses.  There are no abdominal bruits.  Extremities reveal good pedal pulses.  There is no phlebitis or edema.  There is no cyanosis or clubbing.  Strength is normal and symmetrical in all extremities.  There is no lateralizing weakness.  There are no sensory deficits.  The skin is warm and dry.  There is no rash.     Assessment / Plan: Continue same medication.  Blood work today is pending.  Recheck in 4 months for followup office visit and lab work.

## 2013-04-09 NOTE — Assessment & Plan Note (Signed)
Symptoms have improved when she changed her position in bed and started sleeping on the opposite side.

## 2013-04-10 ENCOUNTER — Telehealth: Payer: Self-pay | Admitting: *Deleted

## 2013-04-10 NOTE — Telephone Encounter (Signed)
Message copied by Earvin Hansen on Wed Apr 10, 2013  2:04 PM ------      Message from: Darlin Coco      Created: Wed Apr 10, 2013  1:17 PM       Please report to patient.  The recent labs are stable. Continue same medication and careful diet. ------

## 2013-04-10 NOTE — Progress Notes (Signed)
Quick Note:  Please report to patient. The recent labs are stable. Continue same medication and careful diet. ______ 

## 2013-04-10 NOTE — Telephone Encounter (Signed)
Advised patient of lab results  

## 2013-06-19 ENCOUNTER — Other Ambulatory Visit (HOSPITAL_BASED_OUTPATIENT_CLINIC_OR_DEPARTMENT_OTHER): Payer: Medicare Other

## 2013-06-19 ENCOUNTER — Ambulatory Visit (HOSPITAL_BASED_OUTPATIENT_CLINIC_OR_DEPARTMENT_OTHER): Payer: Medicare Other | Admitting: Nurse Practitioner

## 2013-06-19 VITALS — BP 104/69 | HR 68 | Temp 98.0°F | Resp 18 | Ht 63.0 in | Wt 135.9 lb

## 2013-06-19 DIAGNOSIS — I10 Essential (primary) hypertension: Secondary | ICD-10-CM

## 2013-06-19 DIAGNOSIS — Z809 Family history of malignant neoplasm, unspecified: Secondary | ICD-10-CM

## 2013-06-19 DIAGNOSIS — Z85038 Personal history of other malignant neoplasm of large intestine: Secondary | ICD-10-CM

## 2013-06-19 DIAGNOSIS — C189 Malignant neoplasm of colon, unspecified: Secondary | ICD-10-CM | POA: Diagnosis not present

## 2013-06-19 NOTE — Progress Notes (Signed)
  [facility] OFFICE PROGRESS NOTE   Diagnosis:  Colon cancer.  INTERVAL HISTORY:   Ms. Walmer returns as scheduled. She feels well. She has a good appetite and energy level. She denies abdominal pain. Bowels moving regularly. No bloody or black stools. Over the past one to 2 years she has intermittent noted bilateral "shoulder blade" discomfort with prolonged standing.  Objective:  Vital signs in last 24 hours:  Blood pressure 104/69, pulse 68, temperature 98 F (36.7 C), temperature source Oral, resp. rate 18, height 5\' 3"  (1.6 m), weight 135 lb 14.4 oz (61.644 kg).    HEENT: No thrush or ulcerations. Lymphatics: No palpable cervical, supraclavicular, axillary or inguinal lymph nodes. Resp: Lungs clear. Cardio: Regular cardiac rhythm. GI: Abdomen soft and nontender. No organomegaly. No mass. Vascular: No leg edema.     Lab Results:  Lab Results  Component Value Date   WBC 6.4 12/05/2012   HGB 13.5 12/05/2012   HCT 39.2 12/05/2012   MCV 89.7 12/05/2012   PLT 242.0 12/05/2012   NEUTROABS 3.1 12/05/2012   CEA pending.  Imaging:  No results found.  Medications: I have reviewed the patient's current medications.  Assessment/Plan: 1. Stage II (T3 N0) colon cancer status post right hemicolectomy 05/27/2008. 2. Family history multiple cancers, including colon cancer. 3. Indeterminate liver lesions on a preoperative CT status post MRI of the liver on 08/18/2008 confirming multiple hepatic cysts. 4. Hypertension. 5. Intermittent solid dysphagia reported 09/18/2012. It was recommended she followup with her primary physician or Dr. Oletta Lamas. She did not complain of dysphagia at today's visit. 6. One to two-year history of intermittent bilateral "shoulder blade" discomfort with prolonged standing. We recommended she followup with her primary physician. 7. Surveillance colonoscopy 09/12/2012. Patent end-to-end ileocolonic anastomosis. Moderate diverticulosis sigmoid  colon. Internal hemorrhoids. Repeat surveillance colonoscopy 5 years.   Disposition: She appears stable. She remains in clinical remission from colon cancer. We will followup on the CEA from today. She will return for a followup visit in one year.  Plan reviewed with Dr. Benay Spice.    Owens Shark ANP/GNP-BC   06/19/2013  1:45 PM

## 2013-06-20 ENCOUNTER — Telehealth: Payer: Self-pay | Admitting: Oncology

## 2013-06-20 LAB — CEA: CEA: 0.7 ng/mL (ref 0.0–5.0)

## 2013-06-20 NOTE — Telephone Encounter (Signed)
s.w pt and advised on April 2016 appt....pt ok adn aware °

## 2013-06-21 ENCOUNTER — Telehealth: Payer: Self-pay | Admitting: *Deleted

## 2013-06-21 NOTE — Telephone Encounter (Signed)
Pt returned call, normal CEA results given. She voiced understanding.

## 2013-06-21 NOTE — Telephone Encounter (Signed)
Left message for patient to call back regarding her lab results

## 2013-06-21 NOTE — Telephone Encounter (Signed)
Message copied by Norma Fredrickson on Fri Jun 21, 2013  9:07 AM ------      Message from: [name] B      Created: Thu Jun 20, 2013  8:17 PM       Please call patient, cea is normal ------

## 2013-07-03 ENCOUNTER — Ambulatory Visit: Payer: Medicare Other | Admitting: Cardiology

## 2013-07-16 ENCOUNTER — Ambulatory Visit (INDEPENDENT_AMBULATORY_CARE_PROVIDER_SITE_OTHER): Payer: Medicare Other | Admitting: Family Medicine

## 2013-07-16 ENCOUNTER — Encounter: Payer: Self-pay | Admitting: Family Medicine

## 2013-07-16 VITALS — BP 122/80 | HR 118 | Temp 101.1°F | Wt 135.8 lb

## 2013-07-16 DIAGNOSIS — R509 Fever, unspecified: Secondary | ICD-10-CM

## 2013-07-16 LAB — BASIC METABOLIC PANEL
BUN: 11 mg/dL (ref 6–23)
CALCIUM: 8.9 mg/dL (ref 8.4–10.5)
CO2: 26 meq/L (ref 19–32)
Chloride: 100 mEq/L (ref 96–112)
Creatinine, Ser: 0.9 mg/dL (ref 0.4–1.2)
GFR: 62.04 mL/min (ref >60.00)
GLUCOSE: 115 mg/dL — AB (ref 70–99)
Potassium: 3.3 mEq/L — ABNORMAL LOW (ref 3.5–5.1)
Sodium: 134 mEq/L — ABNORMAL LOW (ref 135–145)

## 2013-07-16 LAB — CBC WITH DIFFERENTIAL/PLATELET
BASOS ABS: 0 10*3/uL (ref 0.0–0.1)
Basophils Relative: 0.2 % (ref 0.0–3.0)
EOS ABS: 0 10*3/uL (ref 0.0–0.7)
Eosinophils Relative: 0 % (ref 0.0–5.0)
HCT: 42.1 % (ref 36.0–46.0)
Hemoglobin: 14.3 g/dL (ref 12.0–15.0)
LYMPHS PCT: 22.2 % (ref 12.0–46.0)
Lymphs Abs: 1.2 10*3/uL (ref 0.7–4.0)
MCHC: 34 g/dL (ref 30.0–36.0)
MCV: 90.1 fl (ref 78.0–100.0)
Monocytes Absolute: 0.4 10*3/uL (ref 0.1–1.0)
Monocytes Relative: 6.6 % (ref 3.0–12.0)
Neutro Abs: 4 10*3/uL (ref 1.4–7.7)
Neutrophils Relative %: 71 % (ref 43.0–77.0)
PLATELETS: 180 10*3/uL (ref 150.0–400.0)
RBC: 4.67 Mil/uL (ref 3.87–5.11)
RDW: 13 % (ref 11.5–15.5)
WBC: 5.6 10*3/uL (ref 4.0–10.5)

## 2013-07-16 NOTE — Patient Instructions (Signed)
Go to the lab on the way out.  We'll contact you with your lab report. Drink plenty of fluids.  Take tylenol for fever.  Update me tomorrow.

## 2013-07-16 NOTE — Progress Notes (Signed)
Pre visit review using our clinic review tool, if applicable. No additional management support is needed unless otherwise documented below in the visit note.  She had a recent tick bite (Friday).  It wasn't engorged.  She got the tick off her L upper posterior thigh w/o complication.   She got sick Friday (same day as above), more so on Sunday.  Chills and fevers.  No vomiting and no diarrhea.  Occ cough, some.  No sputum.  Aching all over "from my head to my toes."  Feels diffusely weak and exhausted.   No ear pain, no ST.  No rash.  No sick contacts.    She feels some better today.    Meds, vitals, and allergies reviewed.   ROS: See HPI.  Otherwise, noncontributory.  GEN: nad, alert and oriented, well appearing.  HEENT: mucous membranes moist, TM wnl, nasals and OP exam wnl NECK: supple w/o LA, no LA CV: rrr.  PULM: totally ctab, no inc wob ABD: soft, +bs EXT: no edema SKIN: no acute rash, prev bite site normal appearing

## 2013-07-17 DIAGNOSIS — R509 Fever, unspecified: Secondary | ICD-10-CM | POA: Insufficient documentation

## 2013-07-17 NOTE — Assessment & Plan Note (Signed)
Not orthostatic, nontoxic.  Likely viral.  Sx onset likely way to quick to be tick related- tick hx is likely unrelated and incidental.  Totally ctab and well appearing.  See notes on labs.  Supportive tx for now.  She agrees with plan.

## 2013-07-30 ENCOUNTER — Ambulatory Visit (INDEPENDENT_AMBULATORY_CARE_PROVIDER_SITE_OTHER): Payer: Medicare Other | Admitting: Cardiology

## 2013-07-30 ENCOUNTER — Encounter: Payer: Self-pay | Admitting: Cardiology

## 2013-07-30 ENCOUNTER — Other Ambulatory Visit: Payer: Medicare Other

## 2013-07-30 VITALS — BP 112/68 | HR 66 | Ht 63.0 in | Wt 134.0 lb

## 2013-07-30 DIAGNOSIS — E7800 Pure hypercholesterolemia, unspecified: Secondary | ICD-10-CM

## 2013-07-30 DIAGNOSIS — R5383 Other fatigue: Secondary | ICD-10-CM

## 2013-07-30 DIAGNOSIS — Z85038 Personal history of other malignant neoplasm of large intestine: Secondary | ICD-10-CM | POA: Diagnosis not present

## 2013-07-30 DIAGNOSIS — R5381 Other malaise: Secondary | ICD-10-CM | POA: Diagnosis not present

## 2013-07-30 DIAGNOSIS — M2669 Other specified disorders of temporomandibular joint: Secondary | ICD-10-CM

## 2013-07-30 DIAGNOSIS — M26629 Arthralgia of temporomandibular joint, unspecified side: Secondary | ICD-10-CM

## 2013-07-30 DIAGNOSIS — R29898 Other symptoms and signs involving the musculoskeletal system: Secondary | ICD-10-CM

## 2013-07-30 DIAGNOSIS — I119 Hypertensive heart disease without heart failure: Secondary | ICD-10-CM | POA: Diagnosis not present

## 2013-07-30 LAB — HEPATIC FUNCTION PANEL
ALT: 14 U/L (ref 0–35)
AST: 18 U/L (ref 0–37)
Albumin: 4 g/dL (ref 3.5–5.2)
Alkaline Phosphatase: 74 U/L (ref 39–117)
Bilirubin, Direct: 0 mg/dL (ref 0.0–0.3)
Total Bilirubin: 0.7 mg/dL (ref 0.2–1.2)
Total Protein: 7.2 g/dL (ref 6.0–8.3)

## 2013-07-30 LAB — CBC WITH DIFFERENTIAL/PLATELET
Basophils Absolute: 0 10*3/uL (ref 0.0–0.1)
Basophils Relative: 0.4 % (ref 0.0–3.0)
EOS PCT: 1.5 % (ref 0.0–5.0)
Eosinophils Absolute: 0.1 10*3/uL (ref 0.0–0.7)
HCT: 40.1 % (ref 36.0–46.0)
HEMOGLOBIN: 13.4 g/dL (ref 12.0–15.0)
LYMPHS PCT: 38.7 % (ref 12.0–46.0)
Lymphs Abs: 2.4 10*3/uL (ref 0.7–4.0)
MCHC: 33.4 g/dL (ref 30.0–36.0)
MCV: 90.4 fl (ref 78.0–100.0)
MONOS PCT: 6.2 % (ref 3.0–12.0)
Monocytes Absolute: 0.4 10*3/uL (ref 0.1–1.0)
NEUTROS ABS: 3.4 10*3/uL (ref 1.4–7.7)
Neutrophils Relative %: 53.2 % (ref 43.0–77.0)
Platelets: 329 10*3/uL (ref 150.0–400.0)
RBC: 4.44 Mil/uL (ref 3.87–5.11)
RDW: 13 % (ref 11.5–15.5)
WBC: 6.3 10*3/uL (ref 4.0–10.5)

## 2013-07-30 LAB — LIPID PANEL
Cholesterol: 221 mg/dL — ABNORMAL HIGH (ref 0–200)
HDL: 49.8 mg/dL (ref >39.00)
LDL Cholesterol: 145 mg/dL — ABNORMAL HIGH (ref 0–99)
Total CHOL/HDL Ratio: 4
Triglycerides: 130 mg/dL (ref 0.0–149.0)
VLDL: 26 mg/dL (ref 0.0–40.0)

## 2013-07-30 LAB — BASIC METABOLIC PANEL
BUN: 14 mg/dL (ref 6–23)
CO2: 25 meq/L (ref 19–32)
CREATININE: 0.9 mg/dL (ref 0.4–1.2)
Calcium: 9.5 mg/dL (ref 8.4–10.5)
Chloride: 107 mEq/L (ref 96–112)
GFR: 67 mL/min (ref >60.00)
Glucose, Bld: 101 mg/dL — ABNORMAL HIGH (ref 70–99)
Potassium: 4.5 mEq/L (ref 3.5–5.1)
Sodium: 140 mEq/L (ref 135–145)

## 2013-07-30 NOTE — Assessment & Plan Note (Signed)
The patient is on Crestor and Lovaza.  She has not been experiencing any myalgias.

## 2013-07-30 NOTE — Patient Instructions (Signed)
Will obtain labs today and call you with the results (lp/bmet/hfp/cbc)  Your physician recommends that you continue on your current medications as directed. Please refer to the Current Medication list given to you today.  Your physician wants you to follow-up in: 6 months with fasting labs (lp/bmet/hfp) You will receive a reminder letter in the mail two months in advance. If you don't receive a letter, please call our office to schedule the follow-up appointment.  

## 2013-07-30 NOTE — Assessment & Plan Note (Signed)
The patient has not been experiencing any exertional chest pain. No symptoms of CHF. No dizziness or syncope. 

## 2013-07-30 NOTE — Assessment & Plan Note (Signed)
The patient's TMJ symptoms have resolved

## 2013-07-30 NOTE — Progress Notes (Signed)
Amber [name] Date of Birth:  Mar 05, 1933 [facility] [address] [place], [place]  [address] [phone]        Fax   [phone]   History of Present Illness: This pleasant 80 year old woman is seen for a scheduled followup office visit. She has a history of essential hypertension. She also has hypercholesterolemia. She has a remote history of colon cancer now more than 5 years ago without recurrence.  She still sees her oncologist Dr. Benay Spice once a year.  She has not had any recurrent anemia. She has not been experiencing any chest pain to suggest angina. . At her last visit she was having problems with TMJ. It has improved when she started sleeping on her other side in bed.   Current Outpatient Prescriptions  Medication Sig Dispense Refill  . amLODipine (NORVASC) 10 MG tablet TAKE 1 TABLET BY MOUTH EVERY DAY  90 tablet  2  . atenolol (TENORMIN) 25 MG tablet TAKE 1 TABLET BY MOUTH DAILY  90 tablet  2  . losartan (COZAAR) 50 MG tablet TAKE 1 TABLET BY MOUTH DAILY  90 tablet  2  . Multiple Vitamin (MULTIVITAMIN) tablet Take 1 tablet by mouth daily.        Marland Kitchen omega-3 acid ethyl esters (LOVAZA) 1 G capsule Take 2 capsules (2 g total) by mouth 2 (two) times daily.  360 capsule  3  . rosuvastatin (CRESTOR) 5 MG tablet Take 1 tablet (5 mg total) by mouth daily.  30 tablet  5   No current facility-administered medications for this visit.    Allergies  Allergen Reactions  . Ace Inhibitors Cough  . Escitalopram Oxalate   . Lipitor [Atorvastatin Calcium]   . Zetia [Ezetimibe]   . Zocor [Simvastatin - High Dose]     Patient Active Problem List   Diagnosis Date Noted  . Fever, unspecified 07/17/2013  . TMJ syndrome 02/07/2012  . Fatigue 06/06/2011  . Benign hypertensive heart disease without heart failure 10/18/2010  . Essential hypertension, benign 06/22/2010  . Pure hypercholesterolemia 06/22/2010  . Back pain 06/22/2010  . TMJ click 80/99/8338  .  History of colon cancer 06/22/2010  . Stress incontinence, female 06/22/2010  . History of Clostridium difficile infection 06/22/2010  . S/P cataract extraction 06/22/2010  . History of colon cancer, stage II 05/27/2008    History  Smoking status  . Never Smoker   Smokeless tobacco  . Never Used    History  Alcohol Use No    Family History  Problem Relation Age of Onset  . Cancer Mother     breast cancer  . Breast cancer Mother   . Cancer Father     colon cancer  . Stroke Father   . Colon cancer Father   . Cancer Brother     bladder cancer  . Cancer Brother     throat cancer    Review of Systems: Constitutional: no fever chills diaphoresis or fatigue or change in weight.  Head and neck: no hearing loss, no epistaxis, no photophobia or visual disturbance. Respiratory: No cough, shortness of breath or wheezing. Cardiovascular: No chest pain peripheral edema, palpitations. Gastrointestinal: No abdominal distention, no abdominal pain, no change in bowel habits hematochezia or melena. Genitourinary: No dysuria, no frequency, no urgency, no nocturia. Musculoskeletal:No arthralgias, no back pain, no gait disturbance or myalgias. Neurological: No dizziness, no headaches, no numbness, no seizures, no syncope, no weakness, no tremors. Hematologic: No lymphadenopathy, no easy bruising.  Psychiatric: No confusion, no hallucinations, no sleep disturbance.    Physical Exam: Filed Vitals:   07/30/13 0851  BP: 112/68  Pulse: 66   the general appearance reveals a well-developed well-nourished woman in no distress.The head and neck exam reveals pupils equal and reactive.  Extraocular movements are full.  There is no scleral icterus.  The mouth and pharynx are normal.  The neck is supple.  The carotids reveal no bruits.  The jugular venous pressure is normal.  The  thyroid is not enlarged.  There is no lymphadenopathy.  The chest is clear to percussion and auscultation.  There are no  rales or rhonchi.  Expansion of the chest is symmetrical.  The precordium is quiet.  The first heart sound is normal.  The second heart sound is physiologically split.  There is no murmur gallop rub or click.  There is no abnormal lift or heave.  The abdomen is soft and nontender.  The bowel sounds are normal.  The liver and spleen are not enlarged.  There are no abdominal masses.  There are no abdominal bruits.  Extremities reveal good pedal pulses.  There is no phlebitis or edema.  There is no cyanosis or clubbing.  Strength is normal and symmetrical in all extremities.  There is no lateralizing weakness.  There are no sensory deficits.  The skin is warm and dry.  There is no rash.     Assessment / Plan: 1. essential hypertension 2. Hypercholesterolemia 3. past history of colon cancer 5 years ago without evidence of recurrence.  Dr. Oletta Lamas is her gastroenterologist and Dr. [name] was her surgeon.  Dr. Julieanne Manson is her oncologist and sees her once a year and checks her hemoglobin. 4. TMJ syndrome, resolved  Continue same medication.  Recheck in 6 months for followup office visit lipid panel hepatic function panel and basal metabolic panel

## 2013-07-31 NOTE — Progress Notes (Signed)
Quick Note:  Please report to patient. The recent labs are stable. Continue same medication and careful diet. There is no anemia. The cholesterol and LDL are higher. She is intolerant of statins. Work harder on careful diet ______

## 2013-08-05 ENCOUNTER — Telehealth: Payer: Self-pay | Admitting: *Deleted

## 2013-08-05 NOTE — Telephone Encounter (Signed)
Message copied by Earvin Hansen on Mon Aug 05, 2013  2:51 PM ------      Message from: Darlin Coco      Created: Wed Jul 31, 2013  8:58 AM       Please report to patient.  The recent labs are stable. Continue same medication and careful diet.  There is no anemia.  The cholesterol and LDL are higher.  She is intolerant of statins.  Work harder on careful diet ------

## 2013-08-05 NOTE — Telephone Encounter (Signed)
Advised patient of lab results  

## 2013-12-06 DIAGNOSIS — Z23 Encounter for immunization: Secondary | ICD-10-CM | POA: Diagnosis not present

## 2013-12-24 DIAGNOSIS — Z1231 Encounter for screening mammogram for malignant neoplasm of breast: Secondary | ICD-10-CM | POA: Diagnosis not present

## 2013-12-24 DIAGNOSIS — Z803 Family history of malignant neoplasm of breast: Secondary | ICD-10-CM | POA: Diagnosis not present

## 2013-12-26 ENCOUNTER — Encounter: Payer: Self-pay | Admitting: Family Medicine

## 2013-12-27 ENCOUNTER — Encounter: Payer: Self-pay | Admitting: *Deleted

## 2014-03-07 ENCOUNTER — Other Ambulatory Visit: Payer: Self-pay | Admitting: Cardiology

## 2014-04-07 ENCOUNTER — Other Ambulatory Visit: Payer: Self-pay | Admitting: Cardiology

## 2014-04-17 ENCOUNTER — Encounter: Payer: Self-pay | Admitting: Cardiology

## 2014-04-17 ENCOUNTER — Telehealth: Payer: Self-pay | Admitting: Cardiology

## 2014-04-17 ENCOUNTER — Ambulatory Visit (INDEPENDENT_AMBULATORY_CARE_PROVIDER_SITE_OTHER): Payer: Medicare Other | Admitting: Cardiology

## 2014-04-17 VITALS — BP 114/70 | HR 71 | Ht 63.0 in | Wt 135.8 lb

## 2014-04-17 DIAGNOSIS — E7800 Pure hypercholesterolemia, unspecified: Secondary | ICD-10-CM

## 2014-04-17 DIAGNOSIS — I119 Hypertensive heart disease without heart failure: Secondary | ICD-10-CM

## 2014-04-17 DIAGNOSIS — Z85038 Personal history of other malignant neoplasm of large intestine: Secondary | ICD-10-CM | POA: Diagnosis not present

## 2014-04-17 LAB — BASIC METABOLIC PANEL
BUN: 16 mg/dL (ref 6–23)
CALCIUM: 9.4 mg/dL (ref 8.4–10.5)
CHLORIDE: 105 meq/L (ref 96–112)
CO2: 30 mEq/L (ref 19–32)
Creatinine, Ser: 0.78 mg/dL (ref 0.40–1.20)
GFR: 75.85 mL/min (ref >60.00)
GLUCOSE: 103 mg/dL — AB (ref 70–99)
POTASSIUM: 4 meq/L (ref 3.5–5.1)
SODIUM: 139 meq/L (ref 135–145)

## 2014-04-17 LAB — LIPID PANEL
CHOL/HDL RATIO: 3
Cholesterol: 147 mg/dL (ref 0–200)
HDL: 58.7 mg/dL (ref >39.00)
LDL Cholesterol: 67 mg/dL (ref 0–99)
NonHDL: 88.3
TRIGLYCERIDES: 108 mg/dL (ref 0.0–149.0)
VLDL: 21.6 mg/dL (ref 0.0–40.0)

## 2014-04-17 LAB — HEPATIC FUNCTION PANEL
ALK PHOS: 84 U/L (ref 39–117)
ALT: 14 U/L (ref 0–35)
AST: 17 U/L (ref 0–37)
Albumin: 4.1 g/dL (ref 3.5–5.2)
Bilirubin, Direct: 0.1 mg/dL (ref 0.0–0.3)
TOTAL PROTEIN: 7.1 g/dL (ref 6.0–8.3)
Total Bilirubin: 0.6 mg/dL (ref 0.2–1.2)

## 2014-04-17 MED ORDER — ROSUVASTATIN CALCIUM 5 MG PO TABS: 5.0000 mg | ORAL_TABLET | Freq: Every day | ORAL | Status: DC

## 2014-04-17 MED ORDER — LOSARTAN POTASSIUM 50 MG PO TABS: 50.0000 mg | ORAL_TABLET | Freq: Every day | ORAL | Status: DC

## 2014-04-17 MED ORDER — AMLODIPINE BESYLATE 10 MG PO TABS: 10.0000 mg | ORAL_TABLET | Freq: Every day | ORAL | Status: DC

## 2014-04-17 MED ORDER — ATENOLOL 25 MG PO TABS: 25.0000 mg | ORAL_TABLET | Freq: Every day | ORAL | Status: DC

## 2014-04-17 MED ORDER — OMEGA-3-ACID ETHYL ESTERS 1 G PO CAPS: 2.0000 g | ORAL_CAPSULE | Freq: Two times a day (BID) | ORAL | Status: DC

## 2014-04-17 NOTE — Progress Notes (Signed)
Cardiology Office Note   Date:  04/17/2014   ID:  Amandamarie, Feggins 03-05-1933, MRN [id number]  PCP:  Elsie Stain, MD  Cardiologist:   Darlin Coco, MD   No chief complaint on file.     History of Present Illness: Amber [name] is a 81 y.o. female who presents for 6 month follow-up  This pleasant 81 year old woman is seen for a scheduled followup office visit. She has a history of essential hypertension. She also has hypercholesterolemia. She has a remote history of colon cancer now more than 5 years ago without recurrence. She still sees her oncologist Dr. Benay Spice once a year. She has not had any recurrent anemia. She has not been experiencing any chest pain to suggest angina. . At her last visit she was having problems with TMJ. It has improved when she started sleeping on her other side in bed. He denies any chest pain or shortness of breath.  Her TMJ symptoms have improved significantly.  She is on amlodipine for blood pressure.  She is not experiencing any peripheral edema.  Past Medical History  Diagnosis Date  . Anemia   . Diverticulitis   . Urinary incontinence     stress incont.  . Urinary tract infection   . Blood transfusion without reported diagnosis     during treatment for colon CA  . Cancer     Colon, s/p partial colectomy, no chemo or radiation  . Hyperlipidemia   . Hypertension   . C. difficile colitis     history of    Past Surgical History  Procedure Laterality Date  . Abdominal hysterectomy    . Colon surgery    . Bladder suspension    . Cataract extraction    . Cardiovascular stress test  11/02/2004    EF 76%     Current Outpatient Prescriptions  Medication Sig Dispense Refill  . amLODipine (NORVASC) 10 MG tablet Take 1 tablet (10 mg total) by mouth daily. 90 tablet 3  . atenolol (TENORMIN) 25 MG tablet Take 1 tablet (25 mg total) by mouth daily. 90 tablet 3  . losartan (COZAAR) 50 MG tablet Take 1 tablet (50 mg total) by mouth  daily. 90 tablet 3  . Multiple Vitamin (MULTIVITAMIN) tablet Take 1 tablet by mouth daily.      Marland Kitchen omega-3 acid ethyl esters (LOVAZA) 1 G capsule Take 2 capsules (2 g total) by mouth 2 (two) times daily. 360 capsule 3  . rosuvastatin (CRESTOR) 5 MG tablet Take 1 tablet (5 mg total) by mouth daily. 90 tablet 3   No current facility-administered medications for this visit.    Allergies:   Ace inhibitors; Escitalopram oxalate; Lipitor; Zetia; and Zocor    Social History:  The patient  reports that she has never smoked. She has never used smokeless tobacco. She reports that she does not drink alcohol or use illicit drugs.   Family History:  The patient's family history includes Breast cancer in her mother; Cancer in her brother, brother, father, and mother; Colon cancer in her father; Stroke in her father.    ROS:  Please see the history of present illness.   Otherwise, review of systems are positive for none.   All other systems are reviewed and negative.    PHYSICAL EXAM: VS:  BP 114/70 mmHg  Pulse 71  Ht 5\' 3"  (1.6 m)  Wt 135 lb 12.8 oz (61.598 kg)  BMI 24.06 kg/m2  SpO2 98% , BMI Body  mass index is 24.06 kg/(m^2). GEN: Well nourished, well developed, in no acute distress HEENT: normal Neck: no JVD, carotid bruits, or masses Cardiac: RRR; no murmurs, rubs, or gallops,no edema  Respiratory:  clear to auscultation bilaterally, normal work of breathing GI: soft, nontender, nondistended, + BS MS: no deformity or atrophy Skin: warm and dry, no rash Neuro:  Strength and sensation are intact Psych: euthymic mood, full affect   EKG:  EKG is not ordered today.    Recent Labs: 07/30/2013: Hemoglobin 13.4; Platelets 329.0 04/17/2014: ALT 14; BUN 16; Creatinine 0.78; Potassium 4.0; Sodium 139    Lipid Panel    Component Value Date/Time   CHOL 147 04/17/2014 0832   TRIG 108.0 04/17/2014 0832   HDL 58.70 04/17/2014 0832   CHOLHDL 3 04/17/2014 0832   VLDL 21.6 04/17/2014 0832    LDLCALC 67 04/17/2014 0832   LDLDIRECT 146.5 06/27/2011 1114      Wt Readings from Last 3 Encounters:  04/17/14 135 lb 12.8 oz (61.598 kg)  07/30/13 134 lb (60.782 kg)  07/16/13 135 lb 12 oz (61.576 kg)      Other studies Reviewed:    ASSESSMENT AND PLAN:  1. essential hypertension 2. Hypercholesterolemia 3. past history of colon cancer 5 years ago without evidence of recurrence. Dr. Oletta Lamas is her gastroenterologist and Dr. [name] was her surgeon. Dr. Julieanne Manson is her oncologist and sees her once a year and checks her hemoglobin. 4. TMJ syndrome, resolved   Current medicines are reviewed at length with the patient today.  The patient does not have concerns regarding medicines.  The following changes have been made:  no change  Labs/ tests ordered today include: Lipid panel, hepatic function panel, and basal metabolic panel  No orders of the defined types were placed in this encounter.     Disposition:   FU with Dr. Mare Ferrari in 6 months for office visit and EKG and fasting lab work   Signed, Darlin Coco, MD  04/17/2014 1:09 PM    [place], [place], [place]  [address] Phone: [phone]; Fax: [phone]

## 2014-04-17 NOTE — Telephone Encounter (Signed)
New message      Pt was seen this am.  Want Melinda to know that [facility] did not get the refill info on the atenolol, losartan and crestor.  Please call these medications in again

## 2014-04-17 NOTE — Telephone Encounter (Signed)
Left message Rx's should be there if not call back tomorrow

## 2014-04-17 NOTE — Patient Instructions (Signed)
Will obtain labs today and call you with the results (lp/bmet/hfp)  Your physician recommends that you continue on your current medications as directed. Please refer to the Current Medication list given to you today.  Your physician wants you to follow-up in: 6 months with fasting labs (lp/bmet/hfp)  You will receive a reminder letter in the mail two months in advance. If you don't receive a letter, please call our office to schedule the follow-up appointment.  

## 2014-04-18 NOTE — Progress Notes (Signed)
Quick Note:  Please report to patient. The recent labs are stable. Continue same medication and careful diet. ______ 

## 2014-05-13 ENCOUNTER — Telehealth: Payer: Self-pay

## 2014-05-13 NOTE — Telephone Encounter (Signed)
For 1 - 41months pt has had frequency of urine with urgency and voiding small amts. Pt request appt. Pt scheduled with Allie Bossier NP on 05/14/14.

## 2014-05-14 ENCOUNTER — Encounter: Payer: Self-pay | Admitting: Primary Care

## 2014-05-14 ENCOUNTER — Ambulatory Visit (INDEPENDENT_AMBULATORY_CARE_PROVIDER_SITE_OTHER): Payer: Medicare Other | Admitting: Primary Care

## 2014-05-14 VITALS — BP 120/72 | HR 63 | Temp 98.7°F | Ht 63.0 in | Wt 135.1 lb

## 2014-05-14 DIAGNOSIS — R339 Retention of urine, unspecified: Secondary | ICD-10-CM | POA: Diagnosis not present

## 2014-05-14 DIAGNOSIS — R5383 Other fatigue: Secondary | ICD-10-CM

## 2014-05-14 LAB — CBC WITH DIFFERENTIAL/PLATELET
BASOS PCT: 0.4 % (ref 0.0–3.0)
Basophils Absolute: 0 10*3/uL (ref 0.0–0.1)
Eosinophils Absolute: 0.1 10*3/uL (ref 0.0–0.7)
Eosinophils Relative: 2.1 % (ref 0.0–5.0)
HEMATOCRIT: 40.8 % (ref 36.0–46.0)
Hemoglobin: 14 g/dL (ref 12.0–15.0)
Lymphocytes Relative: 39.1 % (ref 12.0–46.0)
Lymphs Abs: 2.3 10*3/uL (ref 0.7–4.0)
MCHC: 34.3 g/dL (ref 30.0–36.0)
MCV: 89.3 fl (ref 78.0–100.0)
MONOS PCT: 7.1 % (ref 3.0–12.0)
Monocytes Absolute: 0.4 10*3/uL (ref 0.1–1.0)
NEUTROS ABS: 2.9 10*3/uL (ref 1.4–7.7)
Neutrophils Relative %: 51.3 % (ref 43.0–77.0)
Platelets: 261 10*3/uL (ref 150.0–400.0)
RBC: 4.57 Mil/uL (ref 3.87–5.11)
RDW: 13.4 % (ref 11.5–15.5)
WBC: 5.8 10*3/uL (ref 4.0–10.5)

## 2014-05-14 LAB — TSH: TSH: 2.71 u[IU]/mL (ref 0.35–4.50)

## 2014-05-14 LAB — POCT URINALYSIS DIPSTICK
BILIRUBIN UA: NEGATIVE
Blood, UA: NEGATIVE
GLUCOSE UA: NEGATIVE
Ketones, UA: NEGATIVE
Leukocytes, UA: NEGATIVE
NITRITE UA: NEGATIVE
Protein, UA: NEGATIVE
Spec Grav, UA: 1.015
Urobilinogen, UA: 4
pH, UA: 6

## 2014-05-14 NOTE — Progress Notes (Signed)
Subjective:    Patient ID: Amber [name], female    DOB: 10-13-1933, 81 y.o.   MRN: [id number]  HPI  Amber [name] is a 81 year old female who presents today with a chief complaint of urinary frequency with retention and fatigue.   1) Urinary retention: She's had these symptoms for 2-3 months, but has experienced worsening of her urinary retention with occasional dysuria over the past month. She'll particularly experience dribbling of urine with bladder pressure that occurs daily. She has a history of incontinence and had a sling placed 10 years ago. She has not followed up with Urology since placement 10 years ago. She complaints of mild incontinence, but nothing that's bothersome. She has not taken any medication OTC. Nothing seems to make symptoms worse.  2) Fatigue: She reports feeling tired, weak, and unable to take care of her lawn as she once was able a month ago. She states "I just don't have the energy". Denies unilateral weakness, blood in stool, recent infection, or near syncope. Rest will improve fatigue, increased activity will worsen fatigue. She has not taken anything over the counter and denies dehydration.   Review of Systems  Constitutional: Positive for fatigue. Negative for fever and chills.  HENT: Negative for rhinorrhea.   Respiratory: Negative for cough and shortness of breath.   Cardiovascular: Negative for chest pain and palpitations.  Gastrointestinal: Negative for nausea, vomiting, abdominal pain and blood in stool.  Endocrine: Negative for cold intolerance.  Genitourinary: Positive for frequency and difficulty urinating. Negative for dysuria, hematuria, flank pain, vaginal bleeding, vaginal discharge, vaginal pain and pelvic pain.       Sporadic dysuria which dissipates with water consumption.  Neurological: Negative for dizziness.  Hematological: Negative for adenopathy.       Past Medical History  Diagnosis Date  . Anemia   . Diverticulitis   . Urinary  incontinence     stress incont.  . Urinary tract infection   . Blood transfusion without reported diagnosis     during treatment for colon CA  . Cancer     Colon, s/p partial colectomy, no chemo or radiation  . Hyperlipidemia   . Hypertension   . C. difficile colitis     history of    History   Social History  . Marital Status: Married    Spouse Name: N/A  . Number of Children: N/A  . Years of Education: N/A   Occupational History  . Not on file.   Social History Main Topics  . Smoking status: Never Smoker   . Smokeless tobacco: Never Used  . Alcohol Use: No  . Drug Use: No  . Sexual Activity: Not on file   Other Topics Concern  . Not on file   Social History Narrative   Married 1957   1 son    Past Surgical History  Procedure Laterality Date  . Abdominal hysterectomy    . Colon surgery    . Bladder suspension    . Cataract extraction    . Cardiovascular stress test  11/02/2004    EF 76%    Family History  Problem Relation Age of Onset  . Cancer Mother     breast cancer  . Breast cancer Mother   . Cancer Father     colon cancer  . Stroke Father   . Colon cancer Father   . Cancer Brother     bladder cancer  . Cancer Brother     throat cancer  Allergies  Allergen Reactions  . Ace Inhibitors Cough  . Escitalopram Oxalate   . Lipitor [Atorvastatin Calcium]   . Zetia [Ezetimibe]   . Zocor [Simvastatin - High Dose]     Current Outpatient Prescriptions on File Prior to Visit  Medication Sig Dispense Refill  . amLODipine (NORVASC) 10 MG tablet Take 1 tablet (10 mg total) by mouth daily. 90 tablet 3  . atenolol (TENORMIN) 25 MG tablet Take 1 tablet (25 mg total) by mouth daily. 90 tablet 3  . losartan (COZAAR) 50 MG tablet Take 1 tablet (50 mg total) by mouth daily. 90 tablet 3  . Multiple Vitamin (MULTIVITAMIN) tablet Take 1 tablet by mouth daily.      Marland Kitchen omega-3 acid ethyl esters (LOVAZA) 1 G capsule Take 2 capsules (2 g total) by mouth 2 (two)  times daily. 360 capsule 3  . rosuvastatin (CRESTOR) 5 MG tablet Take 1 tablet (5 mg total) by mouth daily. 90 tablet 3   No current facility-administered medications on file prior to visit.    BP 120/72 mmHg  Pulse 63  Temp(Src) 98.7 F (37.1 C) (Oral)  Ht 5\' 3"  (1.6 m)  Wt 135 lb 1.9 oz (61.29 kg)  BMI 23.94 kg/m2  SpO2 98%    Objective:   Physical Exam  Constitutional: She is oriented to person, place, and time. She appears well-developed.  HENT:  Head: Normocephalic.  Right Ear: External ear normal.  Left Ear: External ear normal.  Mouth/Throat: Oropharynx is clear and moist.  Eyes: Conjunctivae are normal. Pupils are equal, round, and reactive to light.  Neck: Neck supple. No thyromegaly present.  Cardiovascular: Normal rate and regular rhythm.   Pulmonary/Chest: Effort normal and breath sounds normal.  Abdominal: Soft. Bowel sounds are normal. She exhibits no mass. There is no tenderness.  Lymphadenopathy:    She has no cervical adenopathy.  Neurological: She is alert and oriented to person, place, and time. She has normal reflexes. No cranial nerve deficit. Coordination normal.  Skin: Skin is warm and dry.  Psychiatric: She has a normal mood and affect.          Assessment & Plan:

## 2014-05-14 NOTE — Patient Instructions (Signed)
Complete lab work prior to leaving today. Your urine test did not show infection or anything abnormal. You will be contacted regarding your referral to Urology. Take care! Best of luck!

## 2014-05-14 NOTE — Assessment & Plan Note (Signed)
Continues but stable today. No blood loss in stools or urine. Colonoscopy competed in 08/2012 and was unremarkable. (Hx. Colon CA) TSH, CBC, and UA ordered, and will treat accordingly if necessary.

## 2014-05-14 NOTE — Assessment & Plan Note (Signed)
UA negative for infection or hematuria. Will refer to Urology for symptoms and due to history of Sling placement 10 years ago.

## 2014-05-14 NOTE — Progress Notes (Signed)
Pre visit review using our clinic review tool, if applicable. No additional management support is needed unless otherwise documented below in the visit note. 

## 2014-06-20 ENCOUNTER — Telehealth: Payer: Self-pay | Admitting: Oncology

## 2014-06-20 ENCOUNTER — Ambulatory Visit (HOSPITAL_BASED_OUTPATIENT_CLINIC_OR_DEPARTMENT_OTHER): Payer: Medicare Other | Admitting: Oncology

## 2014-06-20 ENCOUNTER — Other Ambulatory Visit: Payer: Medicare Other

## 2014-06-20 VITALS — BP 118/66 | HR 63 | Temp 97.7°F | Resp 18 | Ht 63.0 in | Wt 134.5 lb

## 2014-06-20 DIAGNOSIS — Z8505 Personal history of malignant neoplasm of liver: Secondary | ICD-10-CM

## 2014-06-20 DIAGNOSIS — Z85038 Personal history of other malignant neoplasm of large intestine: Secondary | ICD-10-CM

## 2014-06-20 LAB — CEA: CEA: 0.6 ng/mL (ref 0.0–5.0)

## 2014-06-20 NOTE — Telephone Encounter (Signed)
Pt confirmed labs/ov per 04/21 POF, gave pt AVS and Calendar.... KJ  °

## 2014-06-20 NOTE — Progress Notes (Signed)
  [facility] OFFICE PROGRESS NOTE   Diagnosis: Colon cancer  INTERVAL HISTORY:   [name] returns as scheduled. She feels well. Good appetite. No difficulty with bowel function. No bleeding. She has intermittent discomfort at the upper back when she is physically active. She cares for her husband at home.  Objective:  Vital signs in last 24 hours:  Blood pressure 118/66, pulse 63, temperature 97.7 F (36.5 C), temperature source Oral, resp. rate 18, height 5\' 3"  (1.6 m), weight 134 lb 8 oz (61.009 kg), SpO2 97 %.    HEENT: Neck without mass Lymphatics: No cervical, supraclavicular, axillary, or inguinal nodes Resp: Lungs clear bilaterally Cardio: Regular rate and rhythm GI: No hepatosplenomegaly, nontender, no mass Vascular: No leg edema Musculoskeletal: No spine tenderness     Lab Results:  Lab Results  Component Value Date   WBC 5.8 05/14/2014   HGB 14.0 05/14/2014   HCT 40.8 05/14/2014   MCV 89.3 05/14/2014   PLT 261.0 05/14/2014   NEUTROABS 2.9 05/14/2014     Lab Results  Component Value Date   CEA 0.7 06/19/2013    Medications: I have reviewed the patient's current medications.  Assessment/Plan: 1. Stage II (T3 N0) colon cancer status post right hemicolectomy 05/27/2008. 2. Family history multiple cancers, including colon cancer. 3. Indeterminate liver lesions on a preoperative CT status post MRI of the liver on 08/18/2008 confirming multiple hepatic cysts. 4. Hypertension. 5. Intermittent solid dysphagia reported 09/18/2012. It was recommended she followup with her primary physician or Dr. Oletta Lamas. She did not complain of dysphagia at today's visit. 6. One to two-year history of intermittent bilateral "shoulder blade" discomfort with prolonged standing. We recommended she followup with her primary physician. 7. Surveillance colonoscopy 09/12/2012. Patent end-to-end ileocolonic anastomosis. Moderate diverticulosis sigmoid colon. Internal  hemorrhoids. Repeat surveillance colonoscopy 5 years.  Disposition:  [name] is in clinical remission from colon cancer. She has a good prognosis for a long-term disease-free survival. We will follow-up on the CEA from today. She was discharged from the Oncology clinic today. She will continue clinical follow-up with Dr. Damita Dunnings. She will continue surveillance colonoscopies with Dr. Oletta Lamas.  We are available to see her in the future as needed. [name], MD  06/20/2014  10:07 AM

## 2014-06-23 ENCOUNTER — Telehealth: Payer: Self-pay | Admitting: *Deleted

## 2014-06-23 NOTE — Telephone Encounter (Signed)
-----   Message from Ladell Pier, MD sent at 06/20/2014  4:28 PM EDT ----- Please call patient, please call patient, CEA is normal

## 2014-06-23 NOTE — Telephone Encounter (Signed)
Per Dr. Benay Spice; left message on home # that cea is normal; call if any questions.

## 2014-12-23 ENCOUNTER — Ambulatory Visit (INDEPENDENT_AMBULATORY_CARE_PROVIDER_SITE_OTHER): Payer: Medicare Other

## 2014-12-23 DIAGNOSIS — Z23 Encounter for immunization: Secondary | ICD-10-CM | POA: Diagnosis not present

## 2014-12-26 ENCOUNTER — Ambulatory Visit: Payer: Medicare Other

## 2014-12-29 ENCOUNTER — Ambulatory Visit (INDEPENDENT_AMBULATORY_CARE_PROVIDER_SITE_OTHER): Payer: Medicare Other | Admitting: Cardiology

## 2014-12-29 ENCOUNTER — Other Ambulatory Visit: Payer: Self-pay | Admitting: Cardiology

## 2014-12-29 ENCOUNTER — Encounter: Payer: Self-pay | Admitting: Cardiology

## 2014-12-29 VITALS — BP 110/66 | HR 59 | Ht 62.5 in | Wt 132.6 lb

## 2014-12-29 DIAGNOSIS — I119 Hypertensive heart disease without heart failure: Secondary | ICD-10-CM | POA: Diagnosis not present

## 2014-12-29 DIAGNOSIS — E7800 Pure hypercholesterolemia, unspecified: Secondary | ICD-10-CM | POA: Diagnosis not present

## 2014-12-29 DIAGNOSIS — Z85038 Personal history of other malignant neoplasm of large intestine: Secondary | ICD-10-CM

## 2014-12-29 MED ORDER — ROSUVASTATIN CALCIUM 5 MG PO TABS: 5.0000 mg | ORAL_TABLET | Freq: Every day | ORAL | Status: DC

## 2014-12-29 MED ORDER — LOSARTAN POTASSIUM 50 MG PO TABS: 50.0000 mg | ORAL_TABLET | Freq: Every day | ORAL | Status: DC

## 2014-12-29 MED ORDER — AMLODIPINE BESYLATE 10 MG PO TABS: 10.0000 mg | ORAL_TABLET | Freq: Every day | ORAL | Status: DC

## 2014-12-29 MED ORDER — ATENOLOL 25 MG PO TABS: 25.0000 mg | ORAL_TABLET | Freq: Every day | ORAL | Status: DC

## 2014-12-29 MED ORDER — OMEGA-3-ACID ETHYL ESTERS 1 G PO CAPS: 2.0000 g | ORAL_CAPSULE | Freq: Two times a day (BID) | ORAL | Status: DC

## 2014-12-29 NOTE — Patient Instructions (Signed)
Medication Instructions:  Your physician recommends that you continue on your current medications as directed. Please refer to the Current Medication list given to you today.  Labwork: Lp/bmet/hfp/cbc  Testing/Procedures: none  Follow-Up: As needed  If you need a refill on your cardiac medications before your next appointment, please call your pharmacy.

## 2014-12-29 NOTE — Progress Notes (Signed)
Cardiology Office Note   Date:  12/29/2014   ID:  Aarika, Moon May 15, 1933, MRN [id number]  PCP:  Elsie Stain, MD  Cardiologist: Darlin Coco MD  No chief complaint on file.     History of Present Illness: Amber [name] is a 81 y.o. female who presents for scheduled 6 month follow-up visit She has a history of essential hypertension. She also has hypercholesterolemia. She has a remote history of colon cancer now more than6 years ago without recurrence. She no longer sees her oncologist Dr. Benay Spice once a year. She has not had any recurrent anemia. She has not been experiencing any chest pain to suggest angina. . He denies any chest pain or shortness of breath. Her TMJ symptoms have improved significantly. She is on amlodipine for blood pressure. She is not experiencing any peripheral edema.  When she does housework for a long period of time she does get some interscapular discomfort as well as some slight chest discomfort.  We talked about possible stress testing but she would prefer to wait.  She states that her symptoms have not changed in more than 2 years.  Her last chest x-ray 12/05/12 showed a normal heart size.  Past Medical History  Diagnosis Date  . Anemia   . Diverticulitis   . Urinary incontinence     stress incont.  . Urinary tract infection   . Blood transfusion without reported diagnosis     during treatment for colon CA  . Cancer [facility])     Colon, s/p partial colectomy, no chemo or radiation  . Hyperlipidemia   . Hypertension   . C. difficile colitis     history of    Past Surgical History  Procedure Laterality Date  . Abdominal hysterectomy    . Colon surgery    . Bladder suspension    . Cataract extraction    . Cardiovascular stress test  11/02/2004    EF 76%     Current Outpatient Prescriptions  Medication Sig Dispense Refill  . amLODipine (NORVASC) 10 MG tablet Take 1 tablet (10 mg total) by mouth daily. 90 tablet 3  .  atenolol (TENORMIN) 25 MG tablet Take 1 tablet (25 mg total) by mouth daily. 90 tablet 3  . losartan (COZAAR) 50 MG tablet Take 1 tablet (50 mg total) by mouth daily. 90 tablet 3  . Multiple Vitamin (MULTIVITAMIN) tablet Take 1 tablet by mouth daily.      Marland Kitchen omega-3 acid ethyl esters (LOVAZA) 1 G capsule Take 2 capsules (2 g total) by mouth 2 (two) times daily. 360 capsule 3  . rosuvastatin (CRESTOR) 5 MG tablet Take 1 tablet (5 mg total) by mouth daily. 90 tablet 3   No current facility-administered medications for this visit.    Allergies:   Ace inhibitors; Escitalopram oxalate; Lipitor; Zetia; and Zocor    Social History:  The patient  reports that she has never smoked. She has never used smokeless tobacco. She reports that she does not drink alcohol or use illicit drugs.   Family History:  The patient's family history includes Breast cancer in her mother; Cancer in her brother, brother, father, and mother; Colon cancer in her father; Stroke in her father.    ROS:  Please see the history of present illness.   Otherwise, review of systems are positive for none.   All other systems are reviewed and negative.    PHYSICAL EXAM: VS:  BP 110/66 mmHg  Pulse 59  Ht 5' 2.5" (1.588 m)  Wt 132 lb 9.6 oz (60.147 kg)  BMI 23.85 kg/m2 , BMI Body mass index is 23.85 kg/(m^2). GEN: Well nourished, well developed, in no acute distress HEENT: normal Neck: no JVD, carotid bruits, or masses Cardiac: RRR; no murmurs, rubs, or gallops,no edema  Respiratory:  clear to auscultation bilaterally, normal work of breathing GI: soft, nontender, nondistended, + BS MS: no deformity or atrophy Skin: warm and dry, no rash Neuro:  Strength and sensation are intact Psych: euthymic mood, full affect   EKG:  EKG is ordered today. The ekg ordered today demonstrates sinus bradycardia.  No ischemic changes.  Since prior tracing of 12/05/12, no significant change.   Recent Labs: 04/17/2014: ALT 14; BUN 16;  Creatinine, Ser 0.78; Potassium 4.0; Sodium 139 05/14/2014: Hemoglobin 14.0; Platelets 261.0; TSH 2.71    Lipid Panel    Component Value Date/Time   CHOL 147 04/17/2014 0832   TRIG 108.0 04/17/2014 0832   HDL 58.70 04/17/2014 0832   CHOLHDL 3 04/17/2014 0832   VLDL 21.6 04/17/2014 0832   LDLCALC 67 04/17/2014 0832   LDLDIRECT 146.5 06/27/2011 1114      Wt Readings from Last 3 Encounters:  12/29/14 132 lb 9.6 oz (60.147 kg)  06/20/14 134 lb 8 oz (61.009 kg)  05/14/14 135 lb 1.9 oz (61.29 kg)         ASSESSMENT AND PLAN:  1. essential hypertension 2. Hypercholesterolemia 3. past history of colon cancer 6 years ago without evidence of recurrence. Dr. Oletta Lamas is her gastroenterologist and Dr. [name] was her surgeon. Dr. Julieanne Manson is her oncologist  4. TMJ syndrome, resolved   Current medicines are reviewed at length with the patient today.  The patient does not have concerns regarding medicines.  The following changes have been made:  no change  Labs/ tests ordered today include:   Orders Placed This Encounter  Procedures  . Lipid panel  . Hepatic function panel  . Basic metabolic panel  . CBC with Differential/Platelet  . EKG 12-Lead     Disposition:   We are checking lab work today including CBC lipid panel hepatic function panel and basal metabolic panel.  We refilled her medications today.  She will follow-up with her PCP, Dr. Damita Dunnings.  Recheck here when necessary.  We will continue to see her when she brings her husband in for his cardiac checkups  Signed, Darlin Coco MD 12/29/2014 9:47 AM    [address], [place], [place]  [address] Phone: [phone]; Fax: [phone]

## 2014-12-30 DIAGNOSIS — Z803 Family history of malignant neoplasm of breast: Secondary | ICD-10-CM | POA: Diagnosis not present

## 2014-12-30 DIAGNOSIS — Z1231 Encounter for screening mammogram for malignant neoplasm of breast: Secondary | ICD-10-CM | POA: Diagnosis not present

## 2014-12-30 LAB — CBC WITH DIFFERENTIAL/PLATELET
BASOS ABS: 0 10*3/uL (ref 0.0–0.1)
Basophils Relative: 0 % (ref 0–1)
Eosinophils Absolute: 0.1 10*3/uL (ref 0.0–0.7)
Eosinophils Relative: 2 % (ref 0–5)
HEMATOCRIT: 41.3 % (ref 36.0–46.0)
HEMOGLOBIN: 13.7 g/dL (ref 12.0–15.0)
LYMPHS ABS: 2.3 10*3/uL (ref 0.7–4.0)
LYMPHS PCT: 39 % (ref 12–46)
MCH: 30.4 pg (ref 26.0–34.0)
MCHC: 33.2 g/dL (ref 30.0–36.0)
MCV: 91.6 fL (ref 78.0–100.0)
MONOS PCT: 9 % (ref 3–12)
MPV: 10.8 fL (ref 8.6–12.4)
Monocytes Absolute: 0.5 10*3/uL (ref 0.1–1.0)
NEUTROS ABS: 3 10*3/uL (ref 1.7–7.7)
NEUTROS PCT: 50 % (ref 43–77)
PLATELETS: 289 10*3/uL (ref 150–400)
RBC: 4.51 MIL/uL (ref 3.87–5.11)
RDW: 13.5 % (ref 11.5–15.5)
WBC: 6 10*3/uL (ref 4.0–10.5)

## 2014-12-30 LAB — LIPID PANEL
Cholesterol: 215 mg/dL — ABNORMAL HIGH (ref 125–200)
HDL: 53 mg/dL (ref >=46)
LDL CALC: 145 mg/dL — AB (ref <130)
Total CHOL/HDL Ratio: 4.1 Ratio (ref <=5.0)
Triglycerides: 85 mg/dL (ref <150)
VLDL: 17 mg/dL (ref <30)

## 2014-12-30 LAB — BASIC METABOLIC PANEL
BUN: 13 mg/dL (ref 7–25)
CHLORIDE: 103 mmol/L (ref 98–110)
CO2: 26 mmol/L (ref 20–31)
CREATININE: 0.73 mg/dL (ref 0.60–0.93)
Calcium: 9 mg/dL (ref 8.6–10.4)
GLUCOSE: 85 mg/dL (ref 65–99)
Potassium: 4.1 mmol/L (ref 3.5–5.3)
Sodium: 137 mmol/L (ref 135–146)

## 2014-12-30 LAB — HEPATIC FUNCTION PANEL
ALBUMIN: 4 g/dL (ref 3.6–5.1)
ALK PHOS: 75 U/L (ref 33–130)
ALT: 12 U/L (ref 6–29)
AST: 21 U/L (ref 10–35)
BILIRUBIN INDIRECT: 0.4 mg/dL (ref 0.2–1.2)
Bilirubin, Direct: 0.2 mg/dL (ref <=0.2)
TOTAL PROTEIN: 7.1 g/dL (ref 6.1–8.1)
Total Bilirubin: 0.6 mg/dL (ref 0.2–1.2)

## 2014-12-31 DIAGNOSIS — Z961 Presence of intraocular lens: Secondary | ICD-10-CM | POA: Diagnosis not present

## 2014-12-31 DIAGNOSIS — H35362 Drusen (degenerative) of macula, left eye: Secondary | ICD-10-CM | POA: Diagnosis not present

## 2014-12-31 DIAGNOSIS — H35361 Drusen (degenerative) of macula, right eye: Secondary | ICD-10-CM | POA: Diagnosis not present

## 2014-12-31 DIAGNOSIS — H1013 Acute atopic conjunctivitis, bilateral: Secondary | ICD-10-CM | POA: Diagnosis not present

## 2014-12-31 DIAGNOSIS — H35363 Drusen (degenerative) of macula, bilateral: Secondary | ICD-10-CM | POA: Diagnosis not present

## 2014-12-31 DIAGNOSIS — H02403 Unspecified ptosis of bilateral eyelids: Secondary | ICD-10-CM | POA: Diagnosis not present

## 2015-01-05 ENCOUNTER — Encounter: Payer: Self-pay | Admitting: Family Medicine

## 2015-01-06 ENCOUNTER — Encounter: Payer: Self-pay | Admitting: Family Medicine

## 2015-01-06 DIAGNOSIS — Z1239 Encounter for other screening for malignant neoplasm of breast: Secondary | ICD-10-CM | POA: Diagnosis not present

## 2015-01-06 DIAGNOSIS — R922 Inconclusive mammogram: Secondary | ICD-10-CM | POA: Diagnosis not present

## 2015-01-06 DIAGNOSIS — Z803 Family history of malignant neoplasm of breast: Secondary | ICD-10-CM | POA: Diagnosis not present

## 2015-01-06 DIAGNOSIS — Z1231 Encounter for screening mammogram for malignant neoplasm of breast: Secondary | ICD-10-CM | POA: Diagnosis not present

## 2015-01-08 ENCOUNTER — Telehealth: Payer: Self-pay | Admitting: *Deleted

## 2015-01-08 DIAGNOSIS — R0789 Other chest pain: Secondary | ICD-10-CM

## 2015-01-08 DIAGNOSIS — R0602 Shortness of breath: Secondary | ICD-10-CM

## 2015-01-08 NOTE — Telephone Encounter (Signed)
Spoke with patient regarding labs Stated she continues to have discomfort in her chest that goes through to her back and shortness of breath with little exertion  No better than when she was at office visit  Will forward to  Dr. Mare Ferrari for review

## 2015-01-08 NOTE — Progress Notes (Signed)
Quick Note:  Please report to patient. The recent labs are stable. Continue same medication and careful diet. ______ 

## 2015-01-09 NOTE — Telephone Encounter (Signed)
Advised patient and sent to [facility] to get scheduled

## 2015-01-09 NOTE — Telephone Encounter (Signed)
To evaluate her chest discomfort further we should do a treadmill Myoview.

## 2015-01-26 ENCOUNTER — Telehealth (HOSPITAL_COMMUNITY): Payer: Self-pay | Admitting: *Deleted

## 2015-01-26 NOTE — Telephone Encounter (Signed)
Patient given detailed instructions per Myocardial Perfusion Study Information Sheet for the test on 01/28/15 at 0745. Patient notified to arrive 15 minutes early and that it is imperative to arrive on time for appointment to keep from having the test rescheduled.  If you need to cancel or reschedule your appointment, please call the office within 24 hours of your appointment. Failure to do so may result in a cancellation of your appointment, and a $50 no show fee. Patient verbalized understanding.Emanuel Campos, Ranae Palms

## 2015-01-28 ENCOUNTER — Ambulatory Visit (HOSPITAL_COMMUNITY): Payer: Medicare Other | Attending: Cardiovascular Disease

## 2015-01-28 DIAGNOSIS — I10 Essential (primary) hypertension: Secondary | ICD-10-CM | POA: Insufficient documentation

## 2015-01-28 DIAGNOSIS — R0789 Other chest pain: Secondary | ICD-10-CM | POA: Diagnosis not present

## 2015-01-28 DIAGNOSIS — R0609 Other forms of dyspnea: Secondary | ICD-10-CM | POA: Insufficient documentation

## 2015-01-28 DIAGNOSIS — R0602 Shortness of breath: Secondary | ICD-10-CM

## 2015-01-28 LAB — MYOCARDIAL PERFUSION IMAGING
Estimated workload: 7.1 METS
Exercise duration (min): 6 min
Exercise duration (sec): 6 s
LV dias vol: 57 mL
LV sys vol: 15 mL
MPHR: 141 {beats}/min
Peak HR: 141 {beats}/min
Percent HR: 100 %
RATE: 0.32
RPE: 18
Rest HR: 68 {beats}/min
SDS: 4
SRS: 0
SSS: 4
TID: 0.88

## 2015-01-28 MED ORDER — TECHNETIUM TC 99M SESTAMIBI GENERIC - CARDIOLITE
11.0000 | Freq: Once | INTRAVENOUS | Status: AC | PRN
  Administered 2015-01-28: 11 via INTRAVENOUS

## 2015-01-28 MED ORDER — TECHNETIUM TC 99M SESTAMIBI GENERIC - CARDIOLITE
32.9000 | Freq: Once | INTRAVENOUS | Status: AC | PRN
  Administered 2015-01-28: 32.9 via INTRAVENOUS

## 2015-02-04 ENCOUNTER — Telehealth: Payer: Self-pay | Admitting: Cardiology

## 2015-02-04 NOTE — Telephone Encounter (Signed)
Advised patient

## 2015-02-04 NOTE — Telephone Encounter (Signed)
New message ° ° ° ° ° °Calling to get stress test results °

## 2015-02-04 NOTE — Telephone Encounter (Signed)
-----   Message from Darlin Coco, MD sent at 01/28/2015  5:15 PM EST ----- Please report.  The stress test was normal.

## 2015-06-05 IMAGING — CR DG CHEST 2V
2 series · 2 of 2 positions shown · non-contrast
Comparison: 06/04/2008.

CLINICAL DATA: Chest pain and cough.

EXAM:
CHEST  2 VIEW

[view not recorded (1 of 2)]
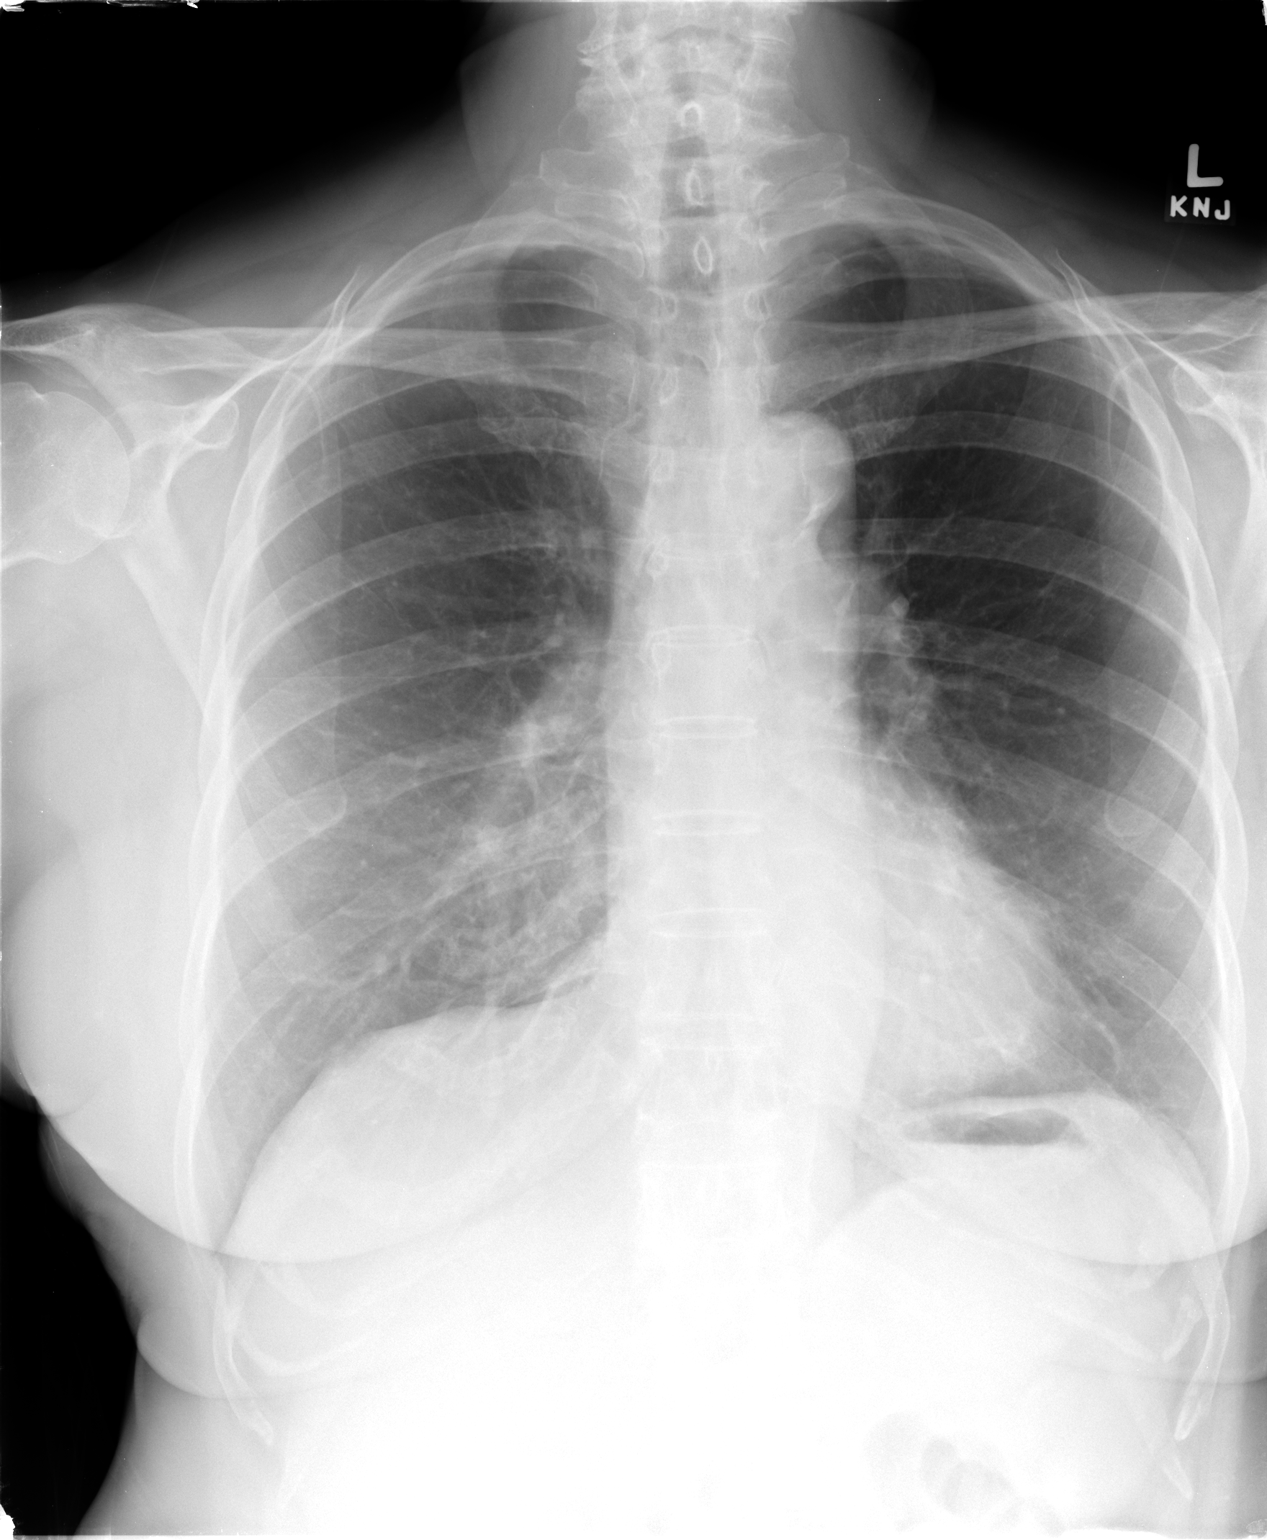

[view not recorded (2 of 2)]
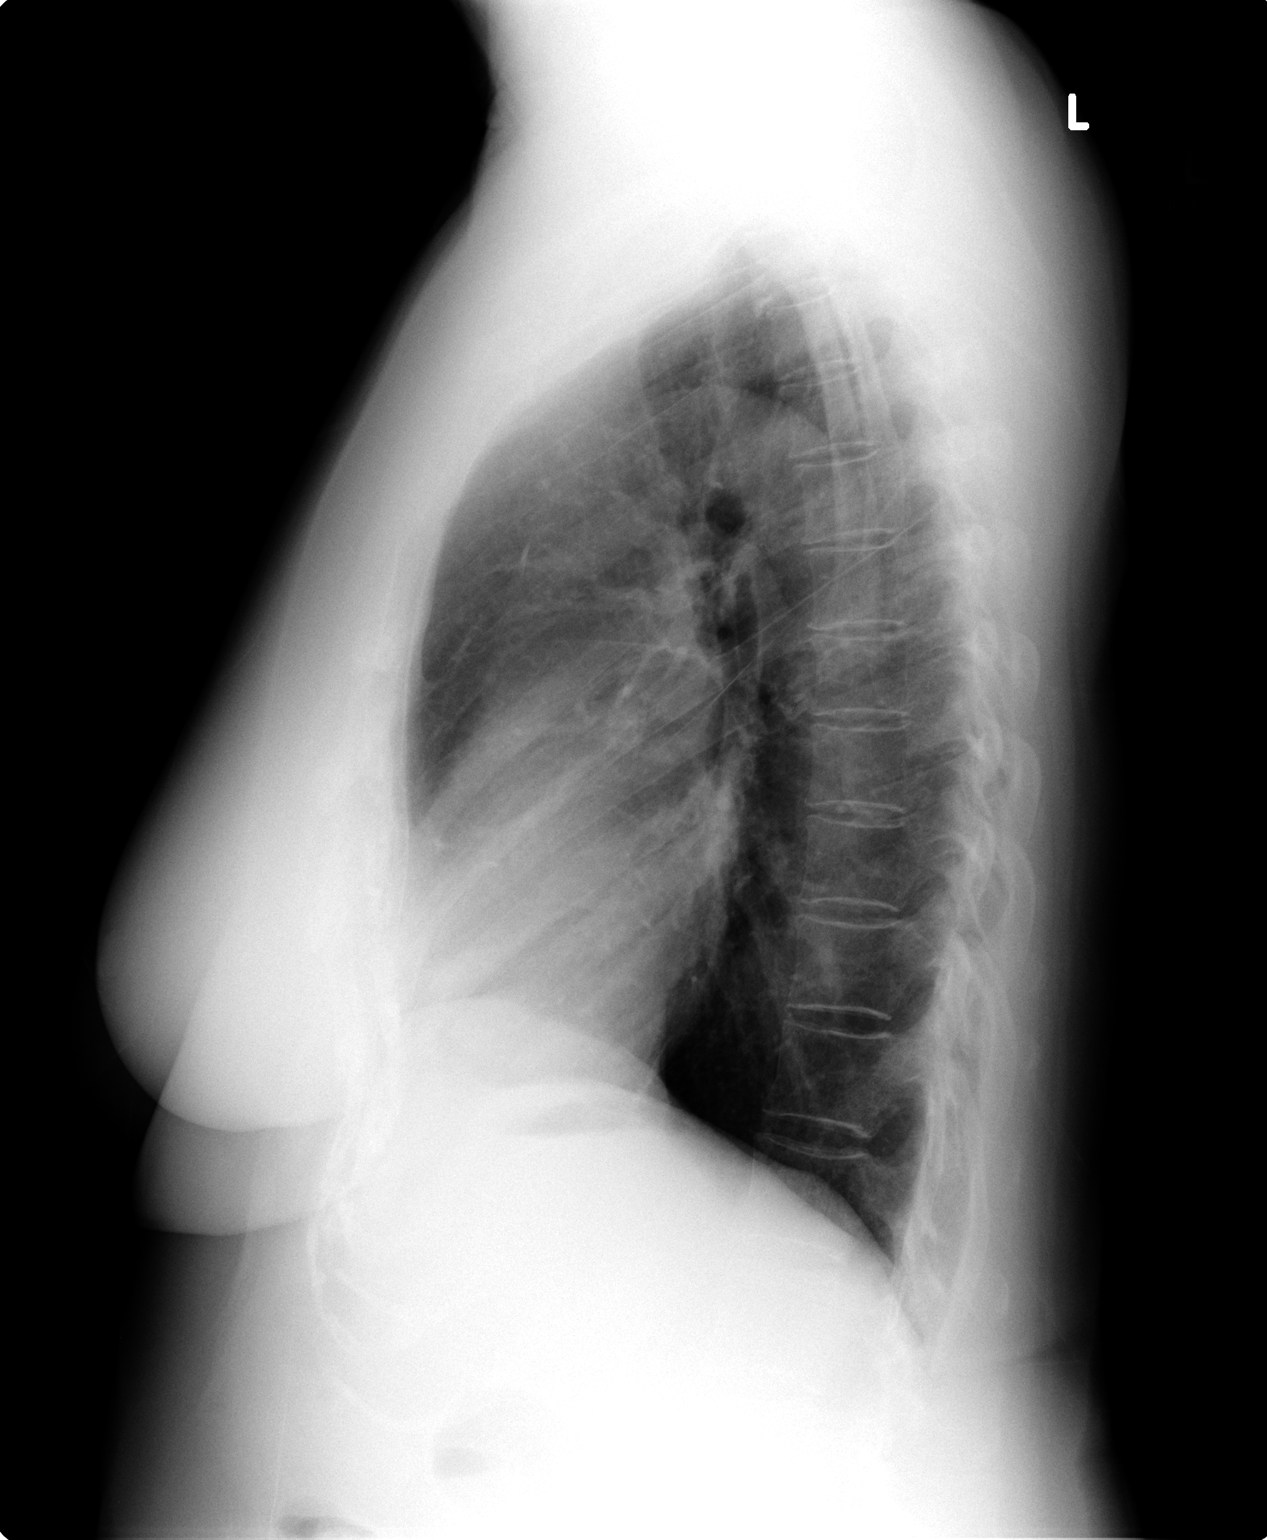

[2 of 2 positions shown; findings below may reference images not displayed]

FINDINGS: Trachea is midline. Heart size normal. Thoracic aorta is calcified.
Biapical pleural thickening. Lungs are hyperinflated but clear. Mild
pectus deformity likely accounts for the slight increase in density
at the medial right lung base. No pleural fluid.
IMPRESSION: Hyperinflation without acute findings.

## 2015-06-25 ENCOUNTER — Other Ambulatory Visit: Payer: Self-pay | Admitting: *Deleted

## 2015-06-25 DIAGNOSIS — Z85038 Personal history of other malignant neoplasm of large intestine: Secondary | ICD-10-CM

## 2015-06-26 ENCOUNTER — Ambulatory Visit (HOSPITAL_BASED_OUTPATIENT_CLINIC_OR_DEPARTMENT_OTHER): Payer: Medicare Other | Admitting: Oncology

## 2015-06-26 ENCOUNTER — Other Ambulatory Visit: Payer: Medicare Other

## 2015-06-26 VITALS — BP 138/76 | HR 79 | Temp 98.1°F | Resp 18 | Ht 62.5 in | Wt 131.4 lb

## 2015-06-26 DIAGNOSIS — Z85038 Personal history of other malignant neoplasm of large intestine: Secondary | ICD-10-CM

## 2015-06-26 NOTE — Progress Notes (Signed)
  [name]   Diagnosis: Colon cancer  INTERVAL HISTORY:   [name] was last seen at the [facility] in April 2016. She was discharged. She was rescheduled by mistaken decided to keep the appointment. She feels well. She reports one episode of blood on the toilet paper after an episode of "constipation".  Objective:  Vital signs in last 24 hours:  Blood pressure 138/76, pulse 79, temperature 98.1 F (36.7 C), temperature source Oral, resp. rate 18, height 5' 2.5" (1.588 m), weight 131 lb 6.4 oz (59.603 kg), SpO2 98 %.    HEENT: Neck without mass Lymphatics: No cervical, supraclavicular, axillary, or inguinal nodes Resp: Lungs clear bilaterally Cardio: Regular rate and rhythm GI: No hepatomegaly, nontender, no mass Vascular: No leg edema  Skin: Multiple lipomas over the arms   Portacath/PICC-without erythema  Lab Results:  CEA pending  Medications: I have reviewed the patient's current medications.  Assessment/Plan: 1. Stage II (T3 N0) colon cancer status post right hemicolectomy 05/27/2008. 2. Family history multiple cancers, including colon cancer. 3. Indeterminate liver lesions on a preoperative CT status post MRI of the liver on 08/18/2008 confirming multiple hepatic cysts. 4. Hypertension. 5. Intermittent solid dysphagia reported 09/18/2012. It was recommended she followup with her primary physician or Dr. Oletta Lamas. She did not complain of dysphagia at today's visit. 6. Surveillance colonoscopy 09/12/2012. Patent end-to-end ileocolonic anastomosis. Moderate diverticulosis sigmoid colon. Internal hemorrhoids. Repeat surveillance colonoscopy 5 years.    Disposition:  Amber [name] remains in clinical remission from colon cancer. We will follow-up on the CEA from today. She will be discharged from the Oncology clinic. I am available to see her in the future as needed. She will continue surveillance colonoscopies.  Amber Coder,  MD  06/26/2015  2:45 PM

## 2015-06-27 LAB — CEA: CEA1: 0.9 ng/mL (ref 0.0–4.7)

## 2015-06-27 LAB — CEA (PARALLEL TESTING): CEA: 0.6 ng/mL

## 2015-06-29 ENCOUNTER — Telehealth: Payer: Self-pay | Admitting: Family Medicine

## 2015-06-29 DIAGNOSIS — R928 Other abnormal and inconclusive findings on diagnostic imaging of breast: Secondary | ICD-10-CM

## 2015-06-29 NOTE — Telephone Encounter (Signed)
Patient is not set up yet for any follow up but she will call to get it scheduled.  Phone number provided to patient.

## 2015-06-29 NOTE — Telephone Encounter (Signed)
Ordered. Thanks

## 2015-06-29 NOTE — Telephone Encounter (Signed)
Is patient scheduled for f/u L breast mammogram and u/s?  She is due for 6 month f/u.  If not then let me know.   Re: prev L breast upper inner quadrant lesion prev seen on mammogram.  Thanks.

## 2015-06-30 ENCOUNTER — Telehealth: Payer: Self-pay | Admitting: *Deleted

## 2015-06-30 NOTE — Telephone Encounter (Signed)
-----   Message from Ladell Pier, MD sent at 06/28/2015  8:27 AM EDT ----- Please call patient, cea is normal

## 2015-06-30 NOTE — Telephone Encounter (Signed)
Called pt with CEA result. Normal, per Dr. Benay Spice. Pt voiced appreciation for call.

## 2015-07-16 DIAGNOSIS — R922 Inconclusive mammogram: Secondary | ICD-10-CM | POA: Diagnosis not present

## 2015-07-20 ENCOUNTER — Encounter: Payer: Self-pay | Admitting: Family Medicine

## 2015-08-16 ENCOUNTER — Other Ambulatory Visit: Payer: Self-pay | Admitting: Family Medicine

## 2015-08-16 DIAGNOSIS — I10 Essential (primary) hypertension: Secondary | ICD-10-CM

## 2015-08-24 ENCOUNTER — Other Ambulatory Visit (INDEPENDENT_AMBULATORY_CARE_PROVIDER_SITE_OTHER): Payer: Medicare Other

## 2015-08-24 ENCOUNTER — Ambulatory Visit (INDEPENDENT_AMBULATORY_CARE_PROVIDER_SITE_OTHER): Payer: Medicare Other

## 2015-08-24 ENCOUNTER — Other Ambulatory Visit: Payer: Self-pay | Admitting: Primary Care

## 2015-08-24 VITALS — BP 104/76 | HR 57 | Temp 97.9°F | Ht 63.0 in | Wt 134.5 lb

## 2015-08-24 DIAGNOSIS — Z0000 Encounter for general adult medical examination without abnormal findings: Secondary | ICD-10-CM

## 2015-08-24 DIAGNOSIS — R5383 Other fatigue: Secondary | ICD-10-CM

## 2015-08-24 DIAGNOSIS — I10 Essential (primary) hypertension: Secondary | ICD-10-CM

## 2015-08-24 DIAGNOSIS — Z23 Encounter for immunization: Secondary | ICD-10-CM

## 2015-08-24 LAB — LIPID PANEL
CHOLESTEROL: 192 mg/dL (ref 0–200)
HDL: 57.6 mg/dL (ref >39.00)
LDL CALC: 115 mg/dL — AB (ref 0–99)
NonHDL: 134.77
TRIGLYCERIDES: 100 mg/dL (ref 0.0–149.0)
Total CHOL/HDL Ratio: 3
VLDL: 20 mg/dL (ref 0.0–40.0)

## 2015-08-24 LAB — CBC
HEMATOCRIT: 42.7 % (ref 36.0–46.0)
HEMOGLOBIN: 14.1 g/dL (ref 12.0–15.0)
MCHC: 33.1 g/dL (ref 30.0–36.0)
MCV: 91.3 fl (ref 78.0–100.0)
Platelets: 263 10*3/uL (ref 150.0–400.0)
RBC: 4.67 Mil/uL (ref 3.87–5.11)
RDW: 12.9 % (ref 11.5–15.5)
WBC: 5.8 10*3/uL (ref 4.0–10.5)

## 2015-08-24 LAB — COMPREHENSIVE METABOLIC PANEL
ALBUMIN: 4.1 g/dL (ref 3.5–5.2)
ALK PHOS: 66 U/L (ref 39–117)
ALT: 13 U/L (ref 0–35)
AST: 15 U/L (ref 0–37)
BUN: 13 mg/dL (ref 6–23)
CALCIUM: 9.4 mg/dL (ref 8.4–10.5)
CHLORIDE: 105 meq/L (ref 96–112)
CO2: 30 mEq/L (ref 19–32)
Creatinine, Ser: 0.83 mg/dL (ref 0.40–1.20)
GFR: 70.36 mL/min (ref >60.00)
Glucose, Bld: 100 mg/dL — ABNORMAL HIGH (ref 70–99)
POTASSIUM: 4.2 meq/L (ref 3.5–5.1)
Sodium: 140 mEq/L (ref 135–145)
TOTAL PROTEIN: 6.9 g/dL (ref 6.0–8.3)
Total Bilirubin: 0.6 mg/dL (ref 0.2–1.2)

## 2015-08-24 LAB — TSH: TSH: 3.04 u[IU]/mL (ref 0.35–4.50)

## 2015-08-24 NOTE — Patient Instructions (Signed)
[name] , Thank you for taking time to come for your Medicare Wellness Visit. I appreciate your ongoing commitment to your health goals. Please review the following plan we discussed and let me know if I can assist you in the future.   These are the goals we discussed: Goals    . Increase water intake     Starting 08/24/2015, I will attempt to drink at least 6-8 glasses of water per day.        This is a list of the screening recommended for you and due dates:  Health Maintenance  Topic Date Due  . Colon Cancer Screening  09/13/2015  . Flu Shot  09/29/2015  . Pneumonia vaccines (2 of 2 - PPSV23) 08/23/2016  . Tetanus Vaccine  06/21/2020  . DEXA scan (bone density measurement)  Completed  . Shingles Vaccine  Completed    Preventive Care for Adults  A healthy lifestyle and preventive care can promote health and wellness. Preventive health guidelines for adults include the following key practices.  . A routine yearly physical is a good way to check with your health care provider about your health and preventive screening. It is a chance to share any concerns and updates on your health and to receive a thorough exam.  . Visit your dentist for a routine exam and preventive care every 6 months. Brush your teeth twice a day and floss once a day. Good oral hygiene prevents tooth decay and gum disease.  . The frequency of eye exams is based on your age, health, family medical history, use  of contact lenses, and other factors. Follow your health care provider's ecommendations for frequency of eye exams.  . Eat a healthy diet. Foods like vegetables, fruits, whole grains, low-fat dairy products, and lean protein foods contain the nutrients you need without too many calories. Decrease your intake of foods high in solid fats, added sugars, and salt. Eat the right amount of calories for you. Get information about a proper diet from your health care provider, if necessary.  . Regular physical  exercise is one of the most important things you can do for your health. Most adults should get at least 150 minutes of moderate-intensity exercise (any activity that increases your heart rate and causes you to sweat) each week. In addition, most adults need muscle-strengthening exercises on 2 or more days a week.  Silver Sneakers may be a benefit available to you. To determine eligibility, you may visit the website: www.silversneakers.com or contact program at (318) 755-8569 Mon-Fri between 8AM-8PM.   . Maintain a healthy weight. The body mass index (BMI) is a screening tool to identify possible weight problems. It provides an estimate of body fat based on height and weight. Your health care provider can find your BMI and can help you achieve or maintain a healthy weight.   For adults 20 years and older: ? A BMI below 18.5 is considered underweight. ? A BMI of 18.5 to 24.9 is normal. ? A BMI of 25 to 29.9 is considered overweight. ? A BMI of 30 and above is considered obese.   . Maintain normal blood lipids and cholesterol levels by exercising and minimizing your intake of saturated fat. Eat a balanced diet with plenty of fruit and vegetables. Blood tests for lipids and cholesterol should begin at age 79 and be repeated every 5 years. If your lipid or cholesterol levels are high, you are over 50, or you are at high risk for heart disease,  you may need your cholesterol levels checked more frequently. Ongoing high lipid and cholesterol levels should be treated with medicines if diet and exercise are not working.  . If you smoke, find out from your health care provider how to quit. If you do not use tobacco, please do not start.  . If you choose to drink alcohol, please do not consume more than 2 drinks per day. One drink is considered to be 12 ounces (355 mL) of beer, 5 ounces (148 mL) of wine, or 1.5 ounces (44 mL) of liquor.  . If you are 42-23 years old, ask your health care provider if you  should take aspirin to prevent strokes.  . Use sunscreen. Apply sunscreen liberally and repeatedly throughout the day. You should seek shade when your shadow is shorter than you. Protect yourself by wearing long sleeves, pants, a wide-brimmed hat, and sunglasses year round, whenever you are outdoors.  . Once a month, do a whole body skin exam, using a mirror to look at the skin on your back. Tell your health care provider of new moles, moles that have irregular borders, moles that are larger than a pencil eraser, or moles that have changed in shape or color.

## 2015-08-24 NOTE — Progress Notes (Signed)
PCP notes:  Health maintenance:  PCV13 - administered  Abnormal screenings:  Hearing - failed Cognitive - Mini-Cog score: 19/20  Patient concerns: pt reports increased fatigue  Nurse concerns: None  Next PCP appt: 08/31/15 @ 1045  I reviewed health advisor's note, was available for consultation on the day of service listed in this note, and agree with documentation and plan. Elsie Stain, MD.

## 2015-08-24 NOTE — Progress Notes (Signed)
Pre visit review using our clinic review tool, if applicable. No additional management support is needed unless otherwise documented below in the visit note. 

## 2015-08-24 NOTE — Progress Notes (Signed)
Subjective:   Amber [name] is a 82 y.o. female who presents for an Initial Medicare Annual Wellness Visit.  Review of Systems    N/A  Cardiac Risk Factors include: advanced age (>70men, >27 women);dyslipidemia;hypertension     Objective:    Today's Vitals   08/24/15 0818  BP: 104/76  Pulse: 57  Temp: 97.9 F (36.6 C)  TempSrc: Oral  Height: 5\' 3"  (1.6 m)  Weight: 134 lb 8 oz (61.009 kg)  SpO2: 95%  PainSc: 0-No pain   Body mass index is 23.83 kg/(m^2).   Current Medications (verified) Outpatient Encounter Prescriptions as of 08/24/2015  Medication Sig  . amLODipine (NORVASC) 10 MG tablet Take 1 tablet (10 mg total) by mouth daily.  Marland Kitchen aspirin 325 MG tablet Take 325 mg by mouth daily.  Marland Kitchen atenolol (TENORMIN) 25 MG tablet Take 1 tablet (25 mg total) by mouth daily.  Marland Kitchen losartan (COZAAR) 50 MG tablet Take 1 tablet (50 mg total) by mouth daily.  . Multiple Vitamin (MULTIVITAMIN) tablet Take 1 tablet by mouth daily.    Marland Kitchen omega-3 acid ethyl esters (LOVAZA) 1 G capsule Take 2 capsules (2 g total) by mouth 2 (two) times daily.  . rosuvastatin (CRESTOR) 5 MG tablet Take 1 tablet (5 mg total) by mouth daily. (Patient not taking: Reported on 08/24/2015)   No facility-administered encounter medications on file as of 08/24/2015.    Allergies (verified) Ace inhibitors; Escitalopram oxalate; Lipitor; Zetia; and Zocor   History: Past Medical History  Diagnosis Date  . Anemia   . Diverticulitis   . Urinary incontinence     stress incont.  . Urinary tract infection   . Blood transfusion without reported diagnosis     during treatment for colon CA  . Cancer [facility])     Colon, s/p partial colectomy, no chemo or radiation  . Hyperlipidemia   . Hypertension   . C. difficile colitis     history of   Past Surgical History  Procedure Laterality Date  . Abdominal hysterectomy    . Colon surgery    . Bladder suspension    . Cataract extraction    . Cardiovascular stress test   11/02/2004    EF 76%   Family History  Problem Relation Age of Onset  . Cancer Mother     breast cancer  . Breast cancer Mother   . Cancer Father     colon cancer  . Stroke Father   . Colon cancer Father   . Cancer Brother     bladder cancer  . Cancer Brother     throat cancer   Social History   Occupational History  . Not on file.   Social History Main Topics  . Smoking status: Never Smoker   . Smokeless tobacco: Never Used  . Alcohol Use: No  . Drug Use: No  . Sexual Activity: No    Tobacco Counseling Counseling given: No   Activities of Daily Living In your present state of health, do you have any difficulty performing the following activities: 08/24/2015  Hearing? N  Vision? N  Difficulty concentrating or making decisions? N  Walking or climbing stairs? N  Dressing or bathing? N  Doing errands, shopping? N  Preparing Food and eating ? N  Using the Toilet? N  In the past six months, have you accidently leaked urine? N  Do you have problems with loss of bowel control? N  Managing your Medications? N  Managing your Finances?  N  Housekeeping or managing your Housekeeping? N    Immunizations and Health Maintenance Immunization History  Administered Date(s) Administered  . DT 06/22/2010  . Influenza,inj,Quad PF,36+ Mos 12/23/2014   There are no preventive care reminders to display for this patient.  Patient Care Team: Tonia Ghent, MD as PCP - General (Family Medicine) Monna Fam, MD as Consulting Physician (Ophthalmology) Laurence Spates, MD as Consulting Physician (Gastroenterology) Henreitta Leber, DDS as Referring Physician (Dentistry)    Assessment:   This is a routine wellness examination for [place].   Hearing/Vision screen  Hearing Screening   125Hz  250Hz  500Hz  1000Hz  2000Hz  4000Hz  8000Hz   Right ear:   0 0 40 40   Left ear:   40 0 40 40   Vision Screening Comments: Last eye exam in 2016 with Dr. 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 Increase water intake     Starting 08/24/2015, I will attempt to drink at least 6-8 glasses of water per day.       Depression Screen PHQ 2/9 Scores 08/24/2015  PHQ - 2 Score 0    Fall Risk Fall Risk  08/24/2015  Falls in the past year? No    Cognitive Function: MMSE - Mini Mental State Exam 08/24/2015  Orientation to time 5  Orientation to Place 5  Registration 3  Attention/ Calculation 0  Recall 2  Recall-comments pt was unable to recall 1 of 3 words  Language- name 2 objects 0  Language- repeat 1  Language- follow 3 step command 3  Language- read & follow direction 0  Write a sentence 0  Copy design 0  Total score 19   PLEASE NOTE: A Mini-Cog screen was completed. Maximum score is 20. A value of 0 denotes this part of [place] MMSE was not completed or the patient failed this part of the Mini-Cog screening.   Mini-Cog Screening Orientation to Time - Max 5 pts Orientation to Place - Max 5 pts Registration - Max 3 pts Recall - Max 3 pts Language Repeat - Max 1 pts Language Follow 3 Step Command - Max 3 pts   Screening Tests Health Maintenance  Topic Date Due  . COLONOSCOPY  09/13/2015  . INFLUENZA VACCINE  09/29/2015  . PNA vac Low Risk Adult (2 of 2 - PPSV23) 08/23/2016  . TETANUS/TDAP  06/21/2020  . DEXA SCAN  Completed  . ZOSTAVAX  Completed      Plan:     I have personally reviewed and addressed the Medicare Annual Wellness questionnaire and have noted the following in the patient's chart:  A. Medical and social history B. Use of alcohol, tobacco or illicit drugs  C. Current medications and supplements D. Functional ability and status E.  Nutritional status F.  Physical activity G. Advance directives H. List of other physicians I.  Hospitalizations,  surgeries, and ER visits in previous 12 months J.  [place] to include hearing, vision, cognitive, depression L. Referrals and appointments - none  In addition, I have reviewed and discussed with patient certain preventive protocols, quality metrics, and best practice recommendations. A written personalized care plan for preventive services as well as general preventive health recommendations were provided to patient.  See attached scanned questionnaire for additional information.   Signed,   Lindell Noe, MHA, BS, LPN  Health Advisor

## 2015-08-24 NOTE — Addendum Note (Signed)
Addended by: Daralene Milch C on: 08/24/2015 12:06 PM   Modules accepted: Orders

## 2015-08-31 ENCOUNTER — Encounter: Payer: Self-pay | Admitting: Family Medicine

## 2015-08-31 ENCOUNTER — Ambulatory Visit (INDEPENDENT_AMBULATORY_CARE_PROVIDER_SITE_OTHER): Payer: Medicare Other | Admitting: Family Medicine

## 2015-08-31 VITALS — BP 124/72 | HR 63 | Temp 97.7°F | Ht 63.0 in | Wt 134.2 lb

## 2015-08-31 DIAGNOSIS — I119 Hypertensive heart disease without heart failure: Secondary | ICD-10-CM | POA: Diagnosis not present

## 2015-08-31 DIAGNOSIS — R5383 Other fatigue: Secondary | ICD-10-CM | POA: Diagnosis not present

## 2015-08-31 DIAGNOSIS — Z7189 Other specified counseling: Secondary | ICD-10-CM

## 2015-08-31 DIAGNOSIS — Z636 Dependent relative needing care at home: Secondary | ICD-10-CM

## 2015-08-31 DIAGNOSIS — E2839 Other primary ovarian failure: Secondary | ICD-10-CM | POA: Diagnosis not present

## 2015-08-31 DIAGNOSIS — Z85038 Personal history of other malignant neoplasm of large intestine: Secondary | ICD-10-CM

## 2015-08-31 DIAGNOSIS — E7800 Pure hypercholesterolemia, unspecified: Secondary | ICD-10-CM

## 2015-08-31 DIAGNOSIS — Z13820 Encounter for screening for osteoporosis: Secondary | ICD-10-CM

## 2015-08-31 MED ORDER — LOSARTAN POTASSIUM 50 MG PO TABS: 50.0000 mg | ORAL_TABLET | Freq: Every day | ORAL | Status: DC

## 2015-08-31 MED ORDER — OMEGA-3-ACID ETHYL ESTERS 1 G PO CAPS: 1.0000 g | ORAL_CAPSULE | Freq: Two times a day (BID) | ORAL | Status: DC

## 2015-08-31 MED ORDER — ROSUVASTATIN CALCIUM 5 MG PO TABS: 5.0000 mg | ORAL_TABLET | ORAL | Status: DC

## 2015-08-31 MED ORDER — ATENOLOL 25 MG PO TABS: 25.0000 mg | ORAL_TABLET | Freq: Every day | ORAL | Status: DC

## 2015-08-31 MED ORDER — AMLODIPINE BESYLATE 10 MG PO TABS: 10.0000 mg | ORAL_TABLET | Freq: Every day | ORAL | Status: DC

## 2015-08-31 NOTE — Progress Notes (Signed)
Pre visit review using our clinic review tool, if applicable. No additional management support is needed unless otherwise documented below in the visit note.  Hypertension:    Using medication without problems or lightheadedness: yes Chest pain with exertion:no Edema:no Short of breath:no  DXA d/w pt.  She would be open to possible tx if DXA were abnormal.    Colon cancer hx.  Recent CEA wnl.  Not due for f/u colonoscopy yet.  D/w pt.  No abd sx.  No complaints.    Taking crestor weekly, due to constipation.  She can tolerate it weekly.  Taking lovaza 1g BID.  Labs d/w pt.    She is caring for her husband, who has memory loss.  She has some help with yardwork.  She wants to keep her husband at home, d/w pt.  She is tired from the strain, d/w pt.  Fatigue noted, cbc and tsh wnl, d/w pt.  Likely with home situation contributing.  She agrees this is possible.   Advance directive d/w pt.  Would have her son designated if patient were incapacitated.    Meds, vitals, and allergies reviewed.   PMH and SH reviewed  ROS: Per HPI unless specifically indicated in ROS section   GEN: nad, alert and oriented HEENT: mucous membranes moist NECK: supple w/o LA CV: rrr. PULM: ctab, no inc wob ABD: soft, +bs EXT: no edema SKIN: no acute rash

## 2015-08-31 NOTE — Patient Instructions (Addendum)
Amber [name] will call about your referral for the bone density test.  See her on the way out.   Update me as needed.  Take care.  Glad to see you.

## 2015-09-02 DIAGNOSIS — M8580 Other specified disorders of bone density and structure, unspecified site: Secondary | ICD-10-CM | POA: Insufficient documentation

## 2015-09-02 DIAGNOSIS — Z7189 Other specified counseling: Secondary | ICD-10-CM | POA: Insufficient documentation

## 2015-09-02 DIAGNOSIS — Z636 Dependent relative needing care at home: Secondary | ICD-10-CM | POA: Insufficient documentation

## 2015-09-02 NOTE — Assessment & Plan Note (Signed)
cbc and tsh wnl, d/w pt.  Likely with home situation contributing.  She agrees this is possible.

## 2015-09-02 NOTE — Assessment & Plan Note (Signed)
DXA ordered

## 2015-09-02 NOTE — Assessment & Plan Note (Signed)
Can try crestor twice a week to see if tolerated.  She agrees.   Labs d/w pt.

## 2015-09-02 NOTE — Assessment & Plan Note (Signed)
Support offered.  D/w pt.   She is caring for her husband, who has memory loss.  She has some help with yardwork.  She wants to keep her husband at home, d/w pt.

## 2015-09-02 NOTE — Assessment & Plan Note (Signed)
Recent CEA wnl.  Not due for f/u colonoscopy yet.  D/w pt.  No abd sx.  No complaints.

## 2015-09-02 NOTE — Assessment & Plan Note (Signed)
Controlled, continue as is.  She agrees.  Labs d/w pt.

## 2015-09-03 DIAGNOSIS — Z780 Asymptomatic menopausal state: Secondary | ICD-10-CM | POA: Diagnosis not present

## 2015-09-03 DIAGNOSIS — M8589 Other specified disorders of bone density and structure, multiple sites: Secondary | ICD-10-CM | POA: Diagnosis not present

## 2015-09-08 ENCOUNTER — Encounter: Payer: Self-pay | Admitting: Family Medicine

## 2015-09-10 ENCOUNTER — Encounter: Payer: Self-pay | Admitting: Family Medicine

## 2015-09-10 ENCOUNTER — Other Ambulatory Visit: Payer: Self-pay | Admitting: Family Medicine

## 2015-09-10 ENCOUNTER — Encounter: Payer: Self-pay | Admitting: *Deleted

## 2015-09-10 MED ORDER — CHOLECALCIFEROL 25 MCG (1000 UT) PO TABS: 1000.0000 [IU] | ORAL_TABLET | Freq: Every day | ORAL | Status: AC

## 2015-10-23 ENCOUNTER — Ambulatory Visit (INDEPENDENT_AMBULATORY_CARE_PROVIDER_SITE_OTHER): Payer: Medicare Other | Admitting: Family Medicine

## 2015-10-23 ENCOUNTER — Encounter: Payer: Self-pay | Admitting: Family Medicine

## 2015-10-23 DIAGNOSIS — R05 Cough: Secondary | ICD-10-CM | POA: Diagnosis not present

## 2015-10-23 DIAGNOSIS — R059 Cough, unspecified: Secondary | ICD-10-CM

## 2015-10-23 MED ORDER — AMOXICILLIN-POT CLAVULANATE 875-125 MG PO TABS: 1.0000 | ORAL_TABLET | Freq: Two times a day (BID) | ORAL | 0 refills | Status: DC

## 2015-10-23 MED ORDER — BENZONATATE 200 MG PO CAPS: 200.0000 mg | ORAL_CAPSULE | Freq: Two times a day (BID) | ORAL | 1 refills | Status: DC | PRN

## 2015-10-23 NOTE — Progress Notes (Signed)
Cough started about 5 days.  "I thought I had whooping cough, I was coughing like I did."  ST in the meantime.  Voice is occ irritated, some times better than others.  Diffuse leg aches.  Taking mucinex.  No known fevers but felt hot.  She is some better now, but not baseline.  Fatigued.  No known sick contacts. Discolored sputum.    Meds, vitals, and allergies reviewed.   ROS: Per HPI unless specifically indicated in ROS section   GEN: nad, alert and oriented HEENT: mucous membranes moist, tm w/o erythema, nasal exam w/o erythema, clear discharge noted,  OP with cobblestoning, sinuses not ttp NECK: supple w/o LA CV: rrr.   PULM: ctab, no inc wob EXT: no edema SKIN: no acute rash

## 2015-10-23 NOTE — Progress Notes (Signed)
Pre visit review using our clinic review tool, if applicable. No additional management support is needed unless otherwise documented below in the visit note. 

## 2015-10-23 NOTE — Patient Instructions (Signed)
Take mucinex in the morning with plenty of fluid.  Take tessalon as needed later in the day for cough.  Start the antibiotics in a few days if not better.  Rest and fluids.  Take care.  Glad to see you.

## 2015-10-25 DIAGNOSIS — R05 Cough: Secondary | ICD-10-CM | POA: Insufficient documentation

## 2015-10-25 DIAGNOSIS — R059 Cough, unspecified: Secondary | ICD-10-CM | POA: Insufficient documentation

## 2015-10-25 NOTE — Assessment & Plan Note (Signed)
Likely viral. D/w pt to: Take mucinex in the morning with plenty of fluid.  Take tessalon as needed later in the day for cough.  Start the antibiotics in a few days if not better.  Rest and fluids in the meantime.  If prolonged sx with discolored sputum, then start abx.  She agrees.  Routine cautions given.  F/u prn.

## 2015-11-27 ENCOUNTER — Ambulatory Visit (INDEPENDENT_AMBULATORY_CARE_PROVIDER_SITE_OTHER): Payer: Medicare Other

## 2015-11-27 DIAGNOSIS — Z23 Encounter for immunization: Secondary | ICD-10-CM

## 2015-12-28 DIAGNOSIS — L570 Actinic keratosis: Secondary | ICD-10-CM | POA: Diagnosis not present

## 2015-12-28 DIAGNOSIS — L814 Other melanin hyperpigmentation: Secondary | ICD-10-CM | POA: Diagnosis not present

## 2015-12-28 DIAGNOSIS — L738 Other specified follicular disorders: Secondary | ICD-10-CM | POA: Diagnosis not present

## 2015-12-28 DIAGNOSIS — D225 Melanocytic nevi of trunk: Secondary | ICD-10-CM | POA: Diagnosis not present

## 2015-12-28 DIAGNOSIS — L821 Other seborrheic keratosis: Secondary | ICD-10-CM | POA: Diagnosis not present

## 2015-12-28 DIAGNOSIS — D1801 Hemangioma of skin and subcutaneous tissue: Secondary | ICD-10-CM | POA: Diagnosis not present

## 2016-01-01 DIAGNOSIS — Z803 Family history of malignant neoplasm of breast: Secondary | ICD-10-CM | POA: Diagnosis not present

## 2016-01-01 DIAGNOSIS — Z1231 Encounter for screening mammogram for malignant neoplasm of breast: Secondary | ICD-10-CM | POA: Diagnosis not present

## 2016-06-14 ENCOUNTER — Ambulatory Visit (INDEPENDENT_AMBULATORY_CARE_PROVIDER_SITE_OTHER): Payer: Medicare Other | Admitting: Family Medicine

## 2016-06-14 ENCOUNTER — Encounter: Payer: Self-pay | Admitting: Family Medicine

## 2016-06-14 VITALS — BP 154/88 | HR 83 | Temp 97.9°F | Wt 137.5 lb

## 2016-06-14 DIAGNOSIS — N644 Mastodynia: Secondary | ICD-10-CM | POA: Diagnosis not present

## 2016-06-14 NOTE — Progress Notes (Signed)
She fell 4/9 and hit her R breast on a piece of wood, at the trash can.  She was getting something out of the trash can and the can slid away from her.   No L sided pain.  No LOC with the fall.  She has normal ROM R shoulder but with some discomfort and that is getting better.  She is sore along the R trap but that is getting better.    R breast with bruising.  No discharge and no bleeding per patient report  This was an accident and she is still safe at home.   Pain is getting better overall but still with some R chest wall pain with a deep breath.     Meds, vitals, and allergies reviewed.   ROS: Per HPI unless specifically indicated in ROS section   nad ncat Mmm rrr ctab abd soft Chaperoned exam. Chest wall not tender to palpation. Right breast with lateral bruising noted. Tender to palpation locally on the breast. No nipple discharge.

## 2016-06-14 NOTE — Patient Instructions (Signed)
Tylenol for pain.  Go to the lab on the way out.  We'll contact you with your lab report. Take care.  Glad to see you.

## 2016-06-14 NOTE — Progress Notes (Signed)
Pre visit review using our clinic review tool, if applicable. No additional management support is needed unless otherwise documented below in the visit note. 

## 2016-06-15 ENCOUNTER — Other Ambulatory Visit: Payer: Self-pay | Admitting: Family Medicine

## 2016-06-15 DIAGNOSIS — N644 Mastodynia: Secondary | ICD-10-CM | POA: Insufficient documentation

## 2016-06-15 NOTE — Assessment & Plan Note (Signed)
Refer for diagnostic mammogram. I want to make sure she does not have a hematoma that would need evacuation or aspiration. Routine cautions given otherwise. Okay for outpatient follow-up. Chest x-ray would likely not change management at this point. The chest wall pain should gradually get better.

## 2016-06-16 ENCOUNTER — Encounter: Payer: Self-pay | Admitting: Family Medicine

## 2016-06-16 DIAGNOSIS — T1490XA Injury, unspecified, initial encounter: Secondary | ICD-10-CM | POA: Diagnosis not present

## 2016-06-16 DIAGNOSIS — Z803 Family history of malignant neoplasm of breast: Secondary | ICD-10-CM | POA: Diagnosis not present

## 2016-06-16 DIAGNOSIS — N644 Mastodynia: Secondary | ICD-10-CM | POA: Diagnosis not present

## 2016-07-19 DIAGNOSIS — N6011 Diffuse cystic mastopathy of right breast: Secondary | ICD-10-CM | POA: Diagnosis not present

## 2016-07-20 ENCOUNTER — Encounter: Payer: Self-pay | Admitting: Family Medicine

## 2016-08-30 NOTE — Progress Notes (Signed)
Subjective:   Amber [name] is a 83 y.o. female who presents for Medicare Annual (Subsequent) preventive examination.  Review of Systems:  No ROS.  Medicare Wellness Visit. Additional risk factors are reflected in the social history.  Cardiac Risk Factors include: advanced age (>42men, >22 women);dyslipidemia;hypertension     Objective:     Vitals: BP 120/76   Pulse 73   Ht 5' 2.5" (1.588 m)   Wt 137 lb (62.1 kg)   SpO2 98%   BMI 24.66 kg/m   Body mass index is 24.66 kg/m.   Tobacco History  Smoking Status  . Never Smoker  Smokeless Tobacco  . Never Used     Counseling given: Not Answered   Past Medical History:  Diagnosis Date  . Anemia   . Blood transfusion without reported diagnosis    during treatment for colon CA  . C. difficile colitis    history of  . Cancer [facility])    Colon, s/p partial colectomy, no chemo or radiation  . Diverticulitis   . Hyperlipidemia   . Hypertension   . Urinary incontinence    stress incont.  . Urinary tract infection    Past Surgical History:  Procedure Laterality Date  . ABDOMINAL HYSTERECTOMY    . BLADDER SUSPENSION    . CARDIOVASCULAR STRESS TEST  11/02/2004   EF 76%  . CATARACT EXTRACTION    . COLON SURGERY     Family History  Problem Relation Age of Onset  . Cancer Mother        breast cancer  . Breast cancer Mother   . Cancer Father        colon cancer  . Stroke Father   . Colon cancer Father   . Cancer Brother        bladder cancer  . Cancer Brother        throat cancer   History  Sexual Activity  . Sexual activity: No    Outpatient Encounter Prescriptions as of 09/06/2016  Medication Sig  . amLODipine (NORVASC) 10 MG tablet Take 1 tablet (10 mg total) by mouth daily.  Marland Kitchen aspirin 325 MG tablet Take 325 mg by mouth daily.  Marland Kitchen atenolol (TENORMIN) 25 MG tablet Take 1 tablet (25 mg total) by mouth daily.  . Cholecalciferol 1000 units tablet Take 1 tablet (1,000 Units total) by mouth daily.  Marland Kitchen losartan  (COZAAR) 50 MG tablet Take 1 tablet (50 mg total) by mouth daily.  . Multiple Vitamin (MULTIVITAMIN) tablet Take 1 tablet by mouth daily.    Marland Kitchen omega-3 acid ethyl esters (LOVAZA) 1 g capsule Take 1 capsule (1 g total) by mouth 2 (two) times daily.  . rosuvastatin (CRESTOR) 5 MG tablet Take 1 tablet (5 mg total) by mouth once a week. (Patient taking differently: Take 5 mg by mouth 2 (two) times a week. )   No facility-administered encounter medications on file as of 09/06/2016.     Activities of Daily Living In your present state of health, do you have any difficulty performing the following activities: 09/06/2016  Hearing? N  Vision? N  Difficulty concentrating or making decisions? N  Walking or climbing stairs? N  Dressing or bathing? N  Doing errands, shopping? N  Preparing Food and eating ? N  Using the Toilet? N  In the past six months, have you accidently leaked urine? Y  Do you have problems with loss of bowel control? N  Managing your Medications? N  Managing your Finances?  N  Housekeeping or managing your Housekeeping? N  Some recent data might be hidden    Patient Care Team: Tonia Ghent, MD as PCP - General (Family Medicine) Monna Fam, MD as Consulting Physician (Ophthalmology) Laurence Spates, MD as Consulting Physician (Gastroenterology) Henreitta Leber, DDS as Referring Physician (Dentistry)    Assessment:    Physical assessment deferred to PCP.  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 Increase water intake          Starting 09/06/2016, I will attempt to drink at least 6-8 glasses of water per day.       Fall Risk Fall Risk  09/06/2016 08/24/2015  Falls in the past year? Yes No  Number falls in past yr: 1 -  Injury with Fall? No -  Follow up Falls prevention discussed -   Depression  Screen PHQ 2/9 Scores 09/06/2016 08/24/2015  PHQ - 2 Score 3 0  PHQ- 9 Score 9 -     Cognitive Function     Mini-Cog - 09/06/16 1353    Normal clock drawing test? yes   How many words correct? 2      PLEASE NOTE: A Mini-Cog screen was completed. Maximum score is 20. A value of 0 denotes this part of Folstein MMSE was not completed or the patient failed this part of the Mini-Cog screening.   Mini-Cog Screening Orientation to Time - Max 5 pts Orientation to Place - Max 5 pts Registration - Max 3 pts Recall - Max 3 pts Language Repeat - Max 1 pts Language Follow 3 Step Command - Max 3 pts  MMSE - Mini Mental State Exam 09/06/2016 08/24/2015  Orientation to time 5 5  Orientation to Place 5 5  Registration 3 3  Attention/ Calculation 0 0  Recall 2 2  Recall-comments - pt was unable to recall 1 of 3 words  Language- name 2 objects 0 0  Language- repeat 1 1  Language- follow 3 step command 3 3  Language- read & follow direction 0 0  Write a sentence 0 0  Copy design 0 0  Total score 19 19        Immunization History  Administered Date(s) Administered  . DT 06/22/2010  . Influenza,inj,Quad PF,36+ Mos 12/23/2014, 11/27/2015  . Pneumococcal Conjugate-13 08/24/2015  . Pneumococcal Polysaccharide-23 09/06/2016   Screening Tests Health Maintenance  Topic Date Due  . INFLUENZA VACCINE  09/28/2016  . COLONOSCOPY  09/12/2017  . TETANUS/TDAP  06/21/2020  . DEXA SCAN  Completed  . PNA vac Low Risk Adult  Completed      Plan:    Follow-up w/ PCP as scheduled.   I have personally reviewed and noted the following in the patient's chart:   . Medical and social history . Use of alcohol, tobacco or illicit drugs  . Current medications and supplements . Functional ability and status . Nutritional status . Physical activity . Advanced directives . List of other physicians . Vitals . Screenings to include cognitive, depression, and falls . Referrals and appointments  In  addition, I have reviewed and discussed with patient certain preventive protocols, quality metrics, and best practice recommendations. A written personalized care plan for preventive services as well as general preventive health recommendations were provided to patient.     Dorrene German, RN  09/06/2016

## 2016-09-01 NOTE — Progress Notes (Signed)
PCP notes:   Health maintenance:  PPSV-23 given today. No other gaps identified.   Abnormal screenings:  Hearing-failed.  Mini-cog: 2/3 recall, stable from last year.   Patient concerns:  She notes that she feels "run down."  She reports that occasionally she has 1-2 drops of blood on toilet paper after having a BM. She does have hemorrhoids but wants to make sure that is the cause of her bleeding given her h/o colon CA.  Nurse concerns: None.   Next PCP appt: 09/09/16 @ 10:45am. Patient stated she may have to reschedule this appointment for another day.  I reviewed health advisor's note, was available for consultation on the day of service listed in this note, and agree with documentation and plan. Elsie Stain, MD.

## 2016-09-05 ENCOUNTER — Other Ambulatory Visit: Payer: Self-pay | Admitting: Family Medicine

## 2016-09-05 DIAGNOSIS — M8580 Other specified disorders of bone density and structure, unspecified site: Secondary | ICD-10-CM

## 2016-09-05 DIAGNOSIS — I10 Essential (primary) hypertension: Secondary | ICD-10-CM

## 2016-09-05 DIAGNOSIS — M899 Disorder of bone, unspecified: Secondary | ICD-10-CM

## 2016-09-06 ENCOUNTER — Other Ambulatory Visit (INDEPENDENT_AMBULATORY_CARE_PROVIDER_SITE_OTHER): Payer: Medicare Other

## 2016-09-06 ENCOUNTER — Ambulatory Visit (INDEPENDENT_AMBULATORY_CARE_PROVIDER_SITE_OTHER): Payer: Medicare Other

## 2016-09-06 VITALS — BP 120/76 | HR 73 | Ht 62.5 in | Wt 137.0 lb

## 2016-09-06 DIAGNOSIS — R5383 Other fatigue: Secondary | ICD-10-CM

## 2016-09-06 DIAGNOSIS — M899 Disorder of bone, unspecified: Secondary | ICD-10-CM

## 2016-09-06 DIAGNOSIS — Z23 Encounter for immunization: Secondary | ICD-10-CM | POA: Diagnosis not present

## 2016-09-06 DIAGNOSIS — M8580 Other specified disorders of bone density and structure, unspecified site: Secondary | ICD-10-CM | POA: Diagnosis not present

## 2016-09-06 DIAGNOSIS — I10 Essential (primary) hypertension: Secondary | ICD-10-CM

## 2016-09-06 DIAGNOSIS — Z0000 Encounter for general adult medical examination without abnormal findings: Secondary | ICD-10-CM | POA: Diagnosis not present

## 2016-09-06 LAB — COMPREHENSIVE METABOLIC PANEL
ALBUMIN: 4.4 g/dL (ref 3.5–5.2)
ALK PHOS: 71 U/L (ref 39–117)
ALT: 12 U/L (ref 0–35)
AST: 15 U/L (ref 0–37)
BILIRUBIN TOTAL: 0.6 mg/dL (ref 0.2–1.2)
BUN: 18 mg/dL (ref 6–23)
CO2: 27 mEq/L (ref 19–32)
CREATININE: 0.89 mg/dL (ref 0.40–1.20)
Calcium: 9.6 mg/dL (ref 8.4–10.5)
Chloride: 103 mEq/L (ref 96–112)
GFR: 64.74 mL/min (ref >60.00)
Glucose, Bld: 96 mg/dL (ref 70–99)
POTASSIUM: 3.8 meq/L (ref 3.5–5.1)
SODIUM: 140 meq/L (ref 135–145)
Total Protein: 7.3 g/dL (ref 6.0–8.3)

## 2016-09-06 LAB — LIPID PANEL
CHOL/HDL RATIO: 3
Cholesterol: 197 mg/dL (ref 0–200)
HDL: 60.7 mg/dL (ref >39.00)
LDL CALC: 104 mg/dL — AB (ref 0–99)
NonHDL: 136.48
TRIGLYCERIDES: 164 mg/dL — AB (ref 0.0–149.0)
VLDL: 32.8 mg/dL (ref 0.0–40.0)

## 2016-09-06 LAB — HEMOGLOBIN: HEMOGLOBIN: 13.9 g/dL (ref 12.0–15.0)

## 2016-09-06 LAB — VITAMIN D 25 HYDROXY (VIT D DEFICIENCY, FRACTURES): VITD: 32.56 ng/mL (ref 30.00–100.00)

## 2016-09-06 NOTE — Patient Instructions (Signed)
Amber [name] , Thank you for taking time to come for your Medicare Wellness Visit. I appreciate your ongoing commitment to your health goals. Please review the following plan we discussed and let me know if I can assist you in the future.   These are the goals we discussed: Goals    . Increase water intake          Starting 09/06/2016, I will attempt to drink at least 6-8 glasses of water per day.        This is a list of the screening recommended for you and due dates:  Health Maintenance  Topic Date Due  . Pneumonia vaccines (2 of 2 - PPSV23) 08/23/2016  . Flu Shot  09/28/2016  . Colon Cancer Screening  09/12/2017  . Tetanus Vaccine  06/21/2020  . DEXA scan (bone density measurement)  Completed   Preventive Care for Adults  A healthy lifestyle and preventive care can promote health and wellness. Preventive health guidelines for adults include the following key practices.  . A routine yearly physical is a good way to check with your health care provider about your health and preventive screening. It is a chance to share any concerns and updates on your health and to receive a thorough exam.  . Visit your dentist for a routine exam and preventive care every 6 months. Brush your teeth twice a day and floss once a day. Good oral hygiene prevents tooth decay and gum disease.  . The frequency of eye exams is based on your age, health, family medical history, use  of contact lenses, and other factors. Follow your health care provider's ecommendations for frequency of eye exams.  . Eat a healthy diet. Foods like vegetables, fruits, whole grains, low-fat dairy products, and lean protein foods contain the nutrients you need without too many calories. Decrease your intake of foods high in solid fats, added sugars, and salt. Eat the right amount of calories for you. Get information about a proper diet from your health care provider, if necessary.  . Regular physical exercise is one of the most  important things you can do for your health. Most adults should get at least 150 minutes of moderate-intensity exercise (any activity that increases your heart rate and causes you to sweat) each week. In addition, most adults need muscle-strengthening exercises on 2 or more days a week.  Silver Sneakers may be a benefit available to you. To determine eligibility, you may visit the website: www.silversneakers.com or contact program at [phone] Mon-Fri between 8AM-8PM.   . Maintain a healthy weight. The body mass index (BMI) is a screening tool to identify possible weight problems. It provides an estimate of body fat based on height and weight. Your health care provider can find your BMI and can help you achieve or maintain a healthy weight.   For adults 20 years and older: ? A BMI below 18.5 is considered underweight. ? A BMI of 18.5 to 24.9 is normal. ? A BMI of 25 to 29.9 is considered overweight. ? A BMI of 30 and above is considered obese.   . Maintain normal blood lipids and cholesterol levels by exercising and minimizing your intake of saturated fat. Eat a balanced diet with plenty of fruit and vegetables. Blood tests for lipids and cholesterol should begin at age 42 and be repeated every 5 years. If your lipid or cholesterol levels are high, you are over 50, or you are at high risk for heart disease, you may  need your cholesterol levels checked more frequently. Ongoing high lipid and cholesterol levels should be treated with medicines if diet and exercise are not working.  . If you smoke, find out from your health care provider how to quit. If you do not use tobacco, please do not start.  . If you choose to drink alcohol, please do not consume more than 2 drinks per day. One drink is considered to be 12 ounces (355 mL) of beer, 5 ounces (148 mL) of wine, or 1.5 ounces (44 mL) of liquor.  . If you are 43-96 years old, ask your health care provider if you should take aspirin to prevent  strokes.  . Use sunscreen. Apply sunscreen liberally and repeatedly throughout the day. You should seek shade when your shadow is shorter than you. Protect yourself by wearing long sleeves, pants, a wide-brimmed hat, and sunglasses year round, whenever you are outdoors.  . Once a month, do a whole body skin exam, using a mirror to look at the skin on your back. Tell your health care provider of new moles, moles that have irregular borders, moles that are larger than a pencil eraser, or moles that have changed in shape or color.

## 2016-09-09 ENCOUNTER — Encounter: Payer: Medicare Other | Admitting: Family Medicine

## 2016-09-12 ENCOUNTER — Encounter: Payer: Self-pay | Admitting: Family Medicine

## 2016-09-12 ENCOUNTER — Ambulatory Visit (INDEPENDENT_AMBULATORY_CARE_PROVIDER_SITE_OTHER): Payer: Medicare Other | Admitting: Family Medicine

## 2016-09-12 VITALS — BP 116/64 | HR 77 | Temp 98.1°F | Ht 63.0 in | Wt 137.0 lb

## 2016-09-12 DIAGNOSIS — E7800 Pure hypercholesterolemia, unspecified: Secondary | ICD-10-CM | POA: Diagnosis not present

## 2016-09-12 DIAGNOSIS — I10 Essential (primary) hypertension: Secondary | ICD-10-CM | POA: Diagnosis not present

## 2016-09-12 DIAGNOSIS — Z636 Dependent relative needing care at home: Secondary | ICD-10-CM | POA: Diagnosis not present

## 2016-09-12 DIAGNOSIS — Z85038 Personal history of other malignant neoplasm of large intestine: Secondary | ICD-10-CM

## 2016-09-12 DIAGNOSIS — Z0000 Encounter for general adult medical examination without abnormal findings: Secondary | ICD-10-CM

## 2016-09-12 MED ORDER — ROSUVASTATIN CALCIUM 5 MG PO TABS: 5.0000 mg | ORAL_TABLET | ORAL | Status: DC

## 2016-09-12 MED ORDER — OMEGA-3-ACID ETHYL ESTERS 1 G PO CAPS
1.0000 g | ORAL_CAPSULE | Freq: Every day | ORAL | Status: DC
Start: 2016-09-12 — End: 2018-10-04

## 2016-09-12 MED ORDER — LOSARTAN POTASSIUM 50 MG PO TABS: 50.0000 mg | ORAL_TABLET | Freq: Every day | ORAL | 3 refills | Status: DC

## 2016-09-12 MED ORDER — AMLODIPINE BESYLATE 10 MG PO TABS: 10.0000 mg | ORAL_TABLET | Freq: Every day | ORAL | 3 refills | Status: DC

## 2016-09-12 NOTE — Patient Instructions (Signed)
Stop the atenolol and see if your energy level improves.  Take care.  Glad to see you.  Update me as needed.  I would get a flu shot each fall.

## 2016-09-12 NOTE — Progress Notes (Signed)
Hypertension:    Using medication without problems or lightheadedness: occ Chest pain with exertion:no Edema:no Short of breath:no  Elevated Cholesterol: Using medications without problems:yes Muscle aches: no Diet compliance: yes Exercise: limited by caring for her husband.   She notes that she feels "run down."  Her husband has dementia and she is the primary caregiver.  D/w pt.  is a chronic strain for patient.  Hx colon cancer. She reports that occasionally she has 1-2 drops of blood on toilet paper after having a BM. She does have hemorrhoids.  Hgb wnl.  Offered GI eval, she declined at this point.  She'll consider. No other abdominal symptoms. No abnormal weight loss. No black stools.  Hearing-failed.  D/w pt.  Declined hearing aids.    Mini-cog: 2/3 recall, stable from last year. She does not give a history of red flag symptoms that would denote significant memory loss.  Advance directive d/w pt.  Would have her son designated if patient were incapacitated.    Meds, vitals, and allergies reviewed.   PMH and SH reviewed  ROS: Per HPI unless specifically indicated in ROS section   GEN: nad, alert and oriented HEENT: mucous membranes moist NECK: supple w/o LA CV: rrr. PULM: ctab, no inc wob ABD: soft, +bs EXT: no edema SKIN: no acute rash

## 2016-09-13 DIAGNOSIS — Z0000 Encounter for general adult medical examination without abnormal findings: Secondary | ICD-10-CM | POA: Insufficient documentation

## 2016-09-13 NOTE — Assessment & Plan Note (Signed)
Likely contributing to her fatigue. Hemoglobin normal. Discussed with her by getting extra help in the home but she wants to continue as is for now. She will update me as needed. She agrees.

## 2016-09-13 NOTE — Assessment & Plan Note (Signed)
Hearing-failed.  D/w pt.  Declined hearing aids.    Mini-cog: 2/3 recall, stable from last year. She does not give a history of red flag symptoms that would denote significant memory loss.

## 2016-09-13 NOTE — Assessment & Plan Note (Signed)
No alarming abdominal symptoms. She does have occasional drop of blood noted when wiping, likely from hemorrhoids. This is not consistent. Offered GI follow-up at this point. She declined. Hemoglobin is still normal. Update me as needed. She agrees.

## 2016-09-13 NOTE — Assessment & Plan Note (Signed)
Reasonable control, however beta blocker may be contributing to her fatigue. Stop atenolol, she can monitor blood pressure at home, update me as needed. She agrees. Labs discussed with patient.

## 2016-09-13 NOTE — Assessment & Plan Note (Signed)
Continue statin as is. She is able to tolerate current dose. This is likely the maximum dose that she can tolerate. Labs discussed with patient.

## 2016-12-15 ENCOUNTER — Ambulatory Visit (INDEPENDENT_AMBULATORY_CARE_PROVIDER_SITE_OTHER): Payer: Medicare Other

## 2016-12-15 DIAGNOSIS — Z23 Encounter for immunization: Secondary | ICD-10-CM

## 2017-01-02 DIAGNOSIS — Z1231 Encounter for screening mammogram for malignant neoplasm of breast: Secondary | ICD-10-CM | POA: Diagnosis not present

## 2017-01-02 DIAGNOSIS — Z803 Family history of malignant neoplasm of breast: Secondary | ICD-10-CM | POA: Diagnosis not present

## 2017-01-02 LAB — HM MAMMOGRAPHY

## 2017-01-10 ENCOUNTER — Encounter: Payer: Self-pay | Admitting: Family Medicine

## 2017-09-11 ENCOUNTER — Other Ambulatory Visit: Payer: Self-pay | Admitting: Family Medicine

## 2017-09-11 DIAGNOSIS — M899 Disorder of bone, unspecified: Secondary | ICD-10-CM

## 2017-09-11 DIAGNOSIS — M8580 Other specified disorders of bone density and structure, unspecified site: Secondary | ICD-10-CM

## 2017-09-11 DIAGNOSIS — E7800 Pure hypercholesterolemia, unspecified: Secondary | ICD-10-CM

## 2017-09-12 ENCOUNTER — Ambulatory Visit (INDEPENDENT_AMBULATORY_CARE_PROVIDER_SITE_OTHER): Payer: Medicare Other

## 2017-09-12 VITALS — BP 110/80 | HR 87 | Temp 98.8°F | Ht 62.5 in | Wt 133.5 lb

## 2017-09-12 DIAGNOSIS — M8580 Other specified disorders of bone density and structure, unspecified site: Secondary | ICD-10-CM

## 2017-09-12 DIAGNOSIS — Z0000 Encounter for general adult medical examination without abnormal findings: Secondary | ICD-10-CM

## 2017-09-12 DIAGNOSIS — M899 Disorder of bone, unspecified: Secondary | ICD-10-CM | POA: Diagnosis not present

## 2017-09-12 DIAGNOSIS — E7800 Pure hypercholesterolemia, unspecified: Secondary | ICD-10-CM | POA: Diagnosis not present

## 2017-09-12 LAB — COMPREHENSIVE METABOLIC PANEL
ALBUMIN: 4.2 g/dL (ref 3.5–5.2)
ALK PHOS: 77 U/L (ref 39–117)
ALT: 15 U/L (ref 0–35)
AST: 17 U/L (ref 0–37)
BILIRUBIN TOTAL: 0.6 mg/dL (ref 0.2–1.2)
BUN: 17 mg/dL (ref 6–23)
CO2: 29 mEq/L (ref 19–32)
Calcium: 9.3 mg/dL (ref 8.4–10.5)
Chloride: 104 mEq/L (ref 96–112)
Creatinine, Ser: 0.81 mg/dL (ref 0.40–1.20)
GFR: 72 mL/min (ref >60.00)
GLUCOSE: 105 mg/dL — AB (ref 70–99)
Potassium: 4.1 mEq/L (ref 3.5–5.1)
Sodium: 140 mEq/L (ref 135–145)
TOTAL PROTEIN: 7.2 g/dL (ref 6.0–8.3)

## 2017-09-12 LAB — LIPID PANEL
CHOLESTEROL: 181 mg/dL (ref 0–200)
HDL: 65.3 mg/dL (ref >39.00)
LDL Cholesterol: 93 mg/dL (ref 0–99)
NONHDL: 115.42
TRIGLYCERIDES: 112 mg/dL (ref 0.0–149.0)
Total CHOL/HDL Ratio: 3
VLDL: 22.4 mg/dL (ref 0.0–40.0)

## 2017-09-12 LAB — VITAMIN D 25 HYDROXY (VIT D DEFICIENCY, FRACTURES): VITD: 27.91 ng/mL — AB (ref 30.00–100.00)

## 2017-09-12 NOTE — Progress Notes (Signed)
Subjective:   Amber [name] is a 84 y.o. female who presents for Medicare Annual (Subsequent) preventive examination.  Review of Systems:  N/A Cardiac Risk Factors include: advanced age (>35men, >74 women);dyslipidemia;hypertension     Objective:     Vitals: BP 110/80 (BP Location: Right Arm, Patient Position: Sitting, Cuff Size: Normal)   Pulse 87   Temp 98.8 F (37.1 C) (Oral)   Ht 5' 2.5" (1.588 m) Comment: shoes  Wt 133 lb 8 oz (60.6 kg)   SpO2 97%   BMI 24.03 kg/m   Body mass index is 24.03 kg/m.  Advanced Directives 09/12/2017 09/06/2016 08/24/2015 06/26/2015 06/20/2014  Does Patient Have a Medical Advance Directive? No No No No No  Does patient want to make changes to medical advance directive? - Yes (MAU/Ambulatory/Procedural Areas - Information given) - - -  Would patient like information on creating a medical advance directive? No - Patient declined - Yes Higher education careers adviser given No - patient declined information Yes Higher education careers adviser given    Tobacco Social History   Tobacco Use  Smoking Status Never Smoker  Smokeless Tobacco Never Used     Counseling given: No   Clinical Intake:  Pre-visit preparation completed: Yes  Pain : No/denies pain Pain Score: 5      Nutritional Status: BMI 25 -29 Overweight Nutritional Risks: None Diabetes: No  How often do you need to have someone help you when you read instructions, pamphlets, or other written materials from your doctor or pharmacy?: 1 - Never What is the last grade level you completed in school?: 11th grade  Interpreter Needed?: No  Comments: pt lives with spouse Information entered by :: LPinson, LPN  Past Medical History:  Diagnosis Date  . Anemia   . Blood transfusion without reported diagnosis    during treatment for colon CA  . C. difficile colitis    history of  . Cancer [facility])    Colon, s/p partial colectomy, no chemo or radiation  . Diverticulitis   . Hyperlipidemia   .  Hypertension   . Urinary incontinence    stress incont.  . Urinary tract infection    Past Surgical History:  Procedure Laterality Date  . ABDOMINAL HYSTERECTOMY    . BLADDER SUSPENSION    . CARDIOVASCULAR STRESS TEST  11/02/2004   EF 76%  . CATARACT EXTRACTION    . COLON SURGERY     Family History  Problem Relation Age of Onset  . Cancer Mother        breast cancer  . Breast cancer Mother   . Cancer Father        colon cancer  . Stroke Father   . Colon cancer Father   . Cancer Brother        bladder cancer  . Cancer Brother        throat cancer   Social History   Socioeconomic History  . Marital status: Married    Spouse name: Not on file  . Number of children: Not on file  . Years of education: Not on file  . Highest education level: Not on file  Occupational History  . Not on file  Social Needs  . Financial resource strain: Not on file  . Food insecurity:    Worry: Not on file    Inability: Not on file  . Transportation needs:    Medical: Not on file    Non-medical: Not on file  Tobacco Use  . Smoking  status: Never Smoker  . Smokeless tobacco: Never Used  Substance and Sexual Activity  . Alcohol use: No  . Drug use: No  . Sexual activity: Never  Lifestyle  . Physical activity:    Days per week: Not on file    Minutes per session: Not on file  . Stress: Not on file  Relationships  . Social connections:    Talks on phone: Not on file    Gets together: Not on file    Attends religious service: Not on file    Active member of club or organization: Not on file    Attends meetings of clubs or organizations: Not on file    Relationship status: Not on file  Other Topics Concern  . Not on file  Social History Narrative   Married 1956   1 son, local    Outpatient Encounter Medications as of 09/12/2017  Medication Sig  . amLODipine (NORVASC) 10 MG tablet Take 1 tablet (10 mg total) by mouth daily.  Marland Kitchen aspirin 325 MG tablet Take 325 mg by mouth daily.    . Cholecalciferol 1000 units tablet Take 1 tablet (1,000 Units total) by mouth daily.  Marland Kitchen losartan (COZAAR) 50 MG tablet Take 1 tablet (50 mg total) by mouth daily.  . Multiple Vitamin (MULTIVITAMIN) tablet Take 1 tablet by mouth daily.    Marland Kitchen omega-3 acid ethyl esters (LOVAZA) 1 g capsule Take 1 capsule (1 g total) by mouth daily.  . rosuvastatin (CRESTOR) 5 MG tablet Take 1 tablet (5 mg total) by mouth 2 (two) times a week.   No facility-administered encounter medications on file as of 09/12/2017.     Activities of Daily Living In your present state of health, do you have any difficulty performing the following activities: 09/12/2017  Hearing? N  Vision? N  Difficulty concentrating or making decisions? N  Walking or climbing stairs? N  Dressing or bathing? N  Doing errands, shopping? N  Preparing Food and eating ? N  Using the Toilet? N  In the past six months, have you accidently leaked urine? N  Do you have problems with loss of bowel control? N  Managing your Medications? N  Managing your Finances? N  Housekeeping or managing your Housekeeping? N  Some recent data might be hidden    Patient Care Team: Tonia Ghent, MD as PCP - General (Family Medicine) Monna Fam, MD as Consulting Physician (Ophthalmology) Laurence Spates, MD as Consulting Physician (Gastroenterology) Henreitta Leber, DDS as Referring Physician (Dentistry)    Assessment:   This is a routine wellness examination for [place].   Hearing Screening   125Hz  250Hz  500Hz  1000Hz  2000Hz  3000Hz  4000Hz  6000Hz  8000Hz   Right ear:   40 40 40  40    Left ear:   40 0 40  40      Visual Acuity Screening   Right eye Left eye Both eyes  Without correction:     With correction: 20/40-1 20/25 20/20-2     Exercise Activities and Dietary recommendations Current Exercise Habits: The patient does not participate in regular exercise at present, Exercise limited by: None identified  Goals    . Patient Stated      Starting 09/12/2017, I will continue to take medications as prescribed.        Fall Risk Fall Risk  09/12/2017 09/06/2016 08/24/2015  Falls in the past year? No Yes No  Number falls in past yr: - 1 -  Injury with Fall? - No -  Follow up - Falls prevention discussed -   Depression Screen PHQ 2/9 Scores 09/12/2017 09/06/2016 08/24/2015  PHQ - 2 Score 2 3 0  PHQ- 9 Score 5 9 -     Cognitive Function MMSE - Mini Mental State Exam 09/12/2017 09/06/2016 08/24/2015  Orientation to time 5 5 5   Orientation to Place 5 5 5   Registration 3 3 3   Attention/ Calculation 0 0 0  Recall 3 2 2   Recall-comments - - pt was unable to recall 1 of 3 words  Language- name 2 objects 0 0 0  Language- repeat 1 1 1   Language- follow 3 step command 3 3 3   Language- read & follow direction 0 0 0  Write a sentence 0 0 0  Copy design 0 0 0  Total score 20 19 19      PLEASE NOTE: A Mini-Cog screen was completed. Maximum score is 20. A value of 0 denotes this part of Folstein MMSE was not completed or the patient failed this part of the Mini-Cog screening.   Mini-Cog Screening Orientation to Time - Max 5 pts Orientation to Place - Max 5 pts Registration - Max 3 pts Recall - Max 3 pts Language Repeat - Max 1 pts Language Follow 3 Step Command - Max 3 pts     Immunization History  Administered Date(s) Administered  . DT 06/22/2010  . Influenza,inj,Quad PF,6+ Mos 12/23/2014, 11/27/2015, 12/15/2016  . Pneumococcal Conjugate-13 08/24/2015  . Pneumococcal Polysaccharide-23 09/06/2016    Screening Tests Health Maintenance  Topic Date Due  . COLONOSCOPY  02/28/2019 (Originally 09/12/2017)  . INFLUENZA VACCINE  09/28/2017  . TETANUS/TDAP  06/21/2020  . DEXA SCAN  Completed  . PNA vac Low Risk Adult  Completed       Plan:     I have personally reviewed, addressed, and noted the following in the patient's chart:  A. Medical and social history B. Use of alcohol, tobacco or illicit drugs  C. Current  medications and supplements D. Functional ability and status E.  Nutritional status F.  Physical activity G. Advance directives H. List of other physicians I.  Hospitalizations, surgeries, and ER visits in previous 12 months J.  [place] to include hearing, vision, cognitive, depression L. Referrals and appointments - none  In addition, I have reviewed and discussed with patient certain preventive protocols, quality metrics, and best practice recommendations. A written personalized care plan for preventive services as well as general preventive health recommendations were provided to patient.  See attached scanned questionnaire for additional information.   Signed,   Lindell Noe, MHA, BS, LPN Health Coach

## 2017-09-12 NOTE — Patient Instructions (Addendum)
[name] , Thank you for taking time to come for your Medicare Wellness Visit. I appreciate your ongoing commitment to your health goals. Please review the following plan we discussed and let me know if I can assist you in the future.   These are the goals we discussed: Goals    . Patient Stated     Starting 09/12/2017, I will continue to take medications as prescribed.        This is a list of the screening recommended for you and due dates:  Health Maintenance  Topic Date Due  . Colon Cancer Screening  02/28/2019*  . Flu Shot  09/28/2017  . Tetanus Vaccine  06/21/2020  . DEXA scan (bone density measurement)  Completed  . Pneumonia vaccines  Completed  *Topic was postponed. The date shown is not the original due date.   Preventive Care for Adults  A healthy lifestyle and preventive care can promote health and wellness. Preventive health guidelines for adults include the following key practices.  . A routine yearly physical is a good way to check with your health care provider about your health and preventive screening. It is a chance to share any concerns and updates on your health and to receive a thorough exam.  . Visit your dentist for a routine exam and preventive care every 6 months. Brush your teeth twice a day and floss once a day. Good oral hygiene prevents tooth decay and gum disease.  . The frequency of eye exams is based on your age, health, family medical history, use  of contact lenses, and other factors. Follow your health care provider's recommendations for frequency of eye exams.  . Eat a healthy diet. Foods like vegetables, fruits, whole grains, low-fat dairy products, and lean protein foods contain the nutrients you need without too many calories. Decrease your intake of foods high in solid fats, added sugars, and salt. Eat the right amount of calories for you. Get information about a proper diet from your health care provider, if necessary.  . Regular physical  exercise is one of the most important things you can do for your health. Most adults should get at least 150 minutes of moderate-intensity exercise (any activity that increases your heart rate and causes you to sweat) each week. In addition, most adults need muscle-strengthening exercises on 2 or more days a week.  Silver Sneakers may be a benefit available to you. To determine eligibility, you may visit the website: www.silversneakers.com or contact program at (334) 669-1673 Mon-Fri between 8AM-8PM.   . Maintain a healthy weight. The body mass index (BMI) is a screening tool to identify possible weight problems. It provides an estimate of body fat based on height and weight. Your health care provider can find your BMI and can help you achieve or maintain a healthy weight.   For adults 20 years and older: ? A BMI below 18.5 is considered underweight. ? A BMI of 18.5 to 24.9 is normal. ? A BMI of 25 to 29.9 is considered overweight. ? A BMI of 30 and above is considered obese.   . Maintain normal blood lipids and cholesterol levels by exercising and minimizing your intake of saturated fat. Eat a balanced diet with plenty of fruit and vegetables. Blood tests for lipids and cholesterol should begin at age 53 and be repeated every 5 years. If your lipid or cholesterol levels are high, you are over 50, or you are at high risk for heart disease, you may need your  cholesterol levels checked more frequently. Ongoing high lipid and cholesterol levels should be treated with medicines if diet and exercise are not working.  . If you smoke, find out from your health care provider how to quit. If you do not use tobacco, please do not start.  . If you choose to drink alcohol, please do not consume more than 2 drinks per day. One drink is considered to be 12 ounces (355 mL) of beer, 5 ounces (148 mL) of wine, or 1.5 ounces (44 mL) of liquor.  . If you are 13-78 years old, ask your health care provider if you  should take aspirin to prevent strokes.  . Use sunscreen. Apply sunscreen liberally and repeatedly throughout the day. You should seek shade when your shadow is shorter than you. Protect yourself by wearing long sleeves, pants, a wide-brimmed hat, and sunglasses year round, whenever you are outdoors.  . Once a month, do a whole body skin exam, using a mirror to look at the skin on your back. Tell your health care provider of new moles, moles that have irregular borders, moles that are larger than a pencil eraser, or moles that have changed in shape or color.

## 2017-09-12 NOTE — Progress Notes (Signed)
PCP notes:   Health maintenance:  Colonoscopy - addressed  Abnormal screenings:   Depression score: 5 Depression screen [facility] 2/9 09/12/2017 09/06/2016 08/24/2015  Decreased Interest 1 0 0  Down, Depressed, Hopeless 1 3 0  PHQ - 2 Score 2 3 0  Altered sleeping 0 3 -  Tired, decreased energy 3 3 -  Change in appetite 0 0 -  Feeling bad or failure about yourself  0 0 -  Trouble concentrating 0 0 -  Moving slowly or fidgety/restless 0 0 -  Suicidal thoughts 0 0 -  PHQ-9 Score 5 9 -  Difficult doing work/chores Not difficult at all Somewhat difficult -   Hearing - failed  Hearing Screening   125Hz  250Hz  500Hz  1000Hz  2000Hz  3000Hz  4000Hz  6000Hz  8000Hz   Right ear:   40 40 40  40    Left ear:   40 0 40  40     Patient concerns:   Patient verbalized pain in neck and right shoulder caused by lifting husband while doing his ADLs. Patient has limited support from daughter-in-law and cannot afford a full-time aide at this time.   Nurse concerns:  None  Next PCP appt:   09/14/2017 @ 1045  I reviewed health advisor's note, was available for consultation on the day of service listed in this note, and agree with documentation and plan. Elsie Stain, MD.

## 2017-09-14 ENCOUNTER — Encounter: Payer: Medicare Other | Admitting: Family Medicine

## 2017-09-14 ENCOUNTER — Ambulatory Visit: Payer: Self-pay | Admitting: Family Medicine

## 2017-09-14 NOTE — Telephone Encounter (Signed)
Pt removed tick yesterday from right shoulder. Pt states it was a very small tick. There is a rash around the tick bite. No fever. Care advice given and pt verbalized understanding. Appt made with Dr Glori Bickers tomorrow. Last tetanus was 2012. Reason for Disposition . Red ring or bull's-eye rash occurs at tick bite  Answer Assessment - Initial Assessment Questions 1. TYPE of TICK: "Is it a wood tick or a deer tick?" If unsure, ask: "What size was the tick?" "Did it look more like a watermelon seed or a poppy seed?"      Deer tick 2. LOCATION: "Where is the tick bite located?"      right shoulder 3. ONSET: "How long do you think the tick was attached before you removed it?" (Hours or days)      Pt not sure how long- could have been since Saturday 4. TETANUS: "When was the last tetanus booster?"      Pt does not remember- 2012 5. PREGNANCY: "Is there any chance you are pregnant?" "When was your last menstrual period?"     n/a  Protocols used: TICK BITE-A-AH

## 2017-09-15 ENCOUNTER — Ambulatory Visit (INDEPENDENT_AMBULATORY_CARE_PROVIDER_SITE_OTHER): Payer: Medicare Other | Admitting: Family Medicine

## 2017-09-15 ENCOUNTER — Ambulatory Visit: Payer: Medicare Other | Admitting: Family Medicine

## 2017-09-15 ENCOUNTER — Encounter: Payer: Self-pay | Admitting: Family Medicine

## 2017-09-15 DIAGNOSIS — W57XXXA Bitten or stung by nonvenomous insect and other nonvenomous arthropods, initial encounter: Secondary | ICD-10-CM | POA: Diagnosis not present

## 2017-09-15 DIAGNOSIS — S40261A Insect bite (nonvenomous) of right shoulder, initial encounter: Secondary | ICD-10-CM

## 2017-09-15 NOTE — Patient Instructions (Signed)
Tick bite looks ok  Watch for enlargement of bite (especially if it looks like a bullseye)  Watch for rash anywhere Watch or fever / or joint swelling or pain  Let us know if you feel sick   Get some rest  Keep the bite clean soap and water  Benadryl cream is fine   Keep [place] posted   Wean insect spray and appropriate clothing when outdoors

## 2017-09-15 NOTE — Progress Notes (Signed)
Subjective:    Patient ID: Amber [name], female    DOB: [birth year], 84 y.o.   MRN: [id number]  HPI 84 yo pt here for tick bite and rash  Pulled tick off on  Tuesday 7/16    (on sat evening she was outdoors in high grass)  Also some chigger bites on legs   Not a very tiny tick/ black  Maybe a little engorged  Had whelp where bite was  Not it is almost gone  She cleaned with alcohol and used benadryl cream   Has not had a fever  No rash No longer itching  No headache    Noted- son had lyme disease   Here with tick bite on R shoulder   Throat is burning a little today  Some sniffling and pnd as well - just this am   Patient Active Problem List   Diagnosis Date Noted  . Tick bite of right shoulder 09/15/2017  . Health care maintenance 09/13/2016  . Breast pain, right 06/15/2016  . Osteopenia 09/02/2015  . Caregiver burden 09/02/2015  . Advance care planning 09/02/2015  . Urinary retention 05/14/2014  . TMJ syndrome 02/07/2012  . Fatigue 06/06/2011  . Benign hypertensive heart disease without heart failure 10/18/2010  . Essential hypertension, benign 06/22/2010  . Pure hypercholesterolemia 06/22/2010  . Back pain 06/22/2010  . History of colon cancer 06/22/2010  . Stress incontinence, female 06/22/2010  . History of Clostridium difficile infection 06/22/2010   Past Medical History:  Diagnosis Date  . Anemia   . Blood transfusion without reported diagnosis    during treatment for colon CA  . C. difficile colitis    history of  . Cancer [facility])    Colon, s/p partial colectomy, no chemo or radiation  . Diverticulitis   . Hyperlipidemia   . Hypertension   . Urinary incontinence    stress incont.  . Urinary tract infection    Past Surgical History:  Procedure Laterality Date  . ABDOMINAL HYSTERECTOMY    . BLADDER SUSPENSION    . CARDIOVASCULAR STRESS TEST  11/02/2004   EF 76%  . CATARACT EXTRACTION    . COLON SURGERY     Social History   Tobacco Use    . Smoking status: Never Smoker  . Smokeless tobacco: Never Used  Substance Use Topics  . Alcohol use: No  . Drug use: No   Family History  Problem Relation Age of Onset  . Cancer Mother        breast cancer  . Breast cancer Mother   . Cancer Father        colon cancer  . Stroke Father   . Colon cancer Father   . Cancer Brother        bladder cancer  . Cancer Brother        throat cancer   Allergies  Allergen Reactions  . Ace Inhibitors Cough  . Escitalopram Oxalate Other (See Comments)    constipation  . Lipitor [Atorvastatin Calcium] Other (See Comments)    constipation  . Zetia [Ezetimibe] Other (See Comments)    constipation  . Zocor [Simvastatin - High Dose] Other (See Comments)    constipation   Current Outpatient Medications on File Prior to Visit  Medication Sig Dispense Refill  . amLODipine (NORVASC) 10 MG tablet Take 1 tablet (10 mg total) by mouth daily. 90 tablet 3  . aspirin 325 MG tablet Take 325 mg by mouth daily.    Marland Kitchen  Cholecalciferol 1000 units tablet Take 1 tablet (1,000 Units total) by mouth daily.    Marland Kitchen losartan (COZAAR) 50 MG tablet Take 1 tablet (50 mg total) by mouth daily. 90 tablet 3  . Multiple Vitamin (MULTIVITAMIN) tablet Take 1 tablet by mouth daily.      Marland Kitchen omega-3 acid ethyl esters (LOVAZA) 1 g capsule Take 1 capsule (1 g total) by mouth daily.    . rosuvastatin (CRESTOR) 5 MG tablet Take 1 tablet (5 mg total) by mouth 2 (two) times a week.     No current facility-administered medications on file prior to visit.     Review of Systems  Constitutional: Negative for activity change, appetite change, fatigue, fever and unexpected weight change.  HENT: Positive for postnasal drip and sore throat. Negative for congestion, ear pain, nosebleeds, rhinorrhea, sinus pressure, sinus pain and voice change.   Eyes: Negative for pain, redness and visual disturbance.  Respiratory: Negative for cough, shortness of breath and wheezing.   Cardiovascular:  Negative for chest pain and palpitations.  Gastrointestinal: Negative for abdominal pain, blood in stool, constipation and diarrhea.  Endocrine: Negative for polydipsia and polyuria.  Genitourinary: Negative for dysuria, frequency and urgency.  Musculoskeletal: Negative for arthralgias, back pain and myalgias.  Skin: Negative for pallor and rash.  Allergic/Immunologic: Negative for environmental allergies.  Neurological: Negative for dizziness, syncope and headaches.  Hematological: Negative for adenopathy. Does not bruise/bleed easily.  Psychiatric/Behavioral: Negative for decreased concentration and dysphoric mood. The patient is not nervous/anxious.        Objective:   Physical Exam  Constitutional: She is oriented to person, place, and time. She appears well-developed and well-nourished. No distress.  HENT:  Head: Normocephalic and atraumatic.  Right Ear: External ear normal.  Left Ear: External ear normal.  Mouth/Throat: Oropharynx is clear and moist. No oropharyngeal exudate.  Mild clear pnd with some nasal congestion   Eyes: Pupils are equal, round, and reactive to light. Conjunctivae and EOM are normal. Right eye exhibits no discharge. Left eye exhibits no discharge. No scleral icterus.  Neck: Normal range of motion. Neck supple.  Cardiovascular: Normal rate, regular rhythm and normal heart sounds.  Pulmonary/Chest: Effort normal and breath sounds normal. No stridor. No respiratory distress. She has no wheezes. She has no rales.  Lymphadenopathy:    She has no cervical adenopathy.  Neurological: She is alert and oriented to person, place, and time. She displays normal reflexes. No cranial nerve deficit. Coordination normal.  Skin: Skin is warm and dry. Capillary refill takes less than 2 seconds. No rash noted.  Small (less than 1 cm) area of induration /erythema on R shoulder  No bullseye appearance Small scab No retained tick parts  No excoriations  No rashes     Psychiatric: She has a normal mood and affect.          Assessment & Plan:   Problem List Items Addressed This Visit      Musculoskeletal and Integument   Tick bite of right shoulder    Small bite area w/o fever or other symptoms  (suspect ST is from early uri and pnd)  Reassuring exam  Disc red flags to watch for -fever/malaise/joint pain/ bullseye lesion/rash/ headache  Disc clean with soap/water Benadryl cream for itch Disc avoidance of insects/ticks in the future

## 2017-09-15 NOTE — Assessment & Plan Note (Signed)
Small bite area w/o fever or other symptoms  (suspect ST is from early uri and pnd)  Reassuring exam  Disc red flags to watch for -fever/malaise/joint pain/ bullseye lesion/rash/ headache  Disc clean with soap/water Benadryl cream for itch Disc avoidance of insects/ticks in the future

## 2017-09-25 ENCOUNTER — Encounter: Payer: Medicare Other | Admitting: Family Medicine

## 2017-09-28 ENCOUNTER — Encounter: Payer: Self-pay | Admitting: Family Medicine

## 2017-09-28 ENCOUNTER — Ambulatory Visit (INDEPENDENT_AMBULATORY_CARE_PROVIDER_SITE_OTHER): Payer: Medicare Other | Admitting: Family Medicine

## 2017-09-28 VITALS — BP 110/80 | HR 87 | Temp 98.8°F | Ht 62.5 in | Wt 133.5 lb

## 2017-09-28 DIAGNOSIS — R5383 Other fatigue: Secondary | ICD-10-CM

## 2017-09-28 DIAGNOSIS — E7800 Pure hypercholesterolemia, unspecified: Secondary | ICD-10-CM

## 2017-09-28 DIAGNOSIS — Z7189 Other specified counseling: Secondary | ICD-10-CM

## 2017-09-28 DIAGNOSIS — Z636 Dependent relative needing care at home: Secondary | ICD-10-CM

## 2017-09-28 DIAGNOSIS — Z0000 Encounter for general adult medical examination without abnormal findings: Secondary | ICD-10-CM

## 2017-09-28 DIAGNOSIS — I10 Essential (primary) hypertension: Secondary | ICD-10-CM | POA: Diagnosis not present

## 2017-09-28 LAB — CBC WITH DIFFERENTIAL/PLATELET
BASOS PCT: 0.5 % (ref 0.0–3.0)
Basophils Absolute: 0 10*3/uL (ref 0.0–0.1)
EOS ABS: 0.2 10*3/uL (ref 0.0–0.7)
Eosinophils Relative: 2 % (ref 0.0–5.0)
HEMATOCRIT: 40.3 % (ref 36.0–46.0)
Hemoglobin: 14 g/dL (ref 12.0–15.0)
LYMPHS PCT: 35.1 % (ref 12.0–46.0)
Lymphs Abs: 2.9 10*3/uL (ref 0.7–4.0)
MCHC: 34.7 g/dL (ref 30.0–36.0)
MCV: 88.6 fl (ref 78.0–100.0)
MONO ABS: 0.6 10*3/uL (ref 0.1–1.0)
MONOS PCT: 7.2 % (ref 3.0–12.0)
NEUTROS ABS: 4.6 10*3/uL (ref 1.4–7.7)
Neutrophils Relative %: 55.2 % (ref 43.0–77.0)
PLATELETS: 298 10*3/uL (ref 150.0–400.0)
RBC: 4.55 Mil/uL (ref 3.87–5.11)
RDW: 13.3 % (ref 11.5–15.5)
WBC: 8.3 10*3/uL (ref 4.0–10.5)

## 2017-09-28 LAB — TSH: TSH: 4.42 u[IU]/mL (ref 0.35–4.50)

## 2017-09-28 MED ORDER — ROSUVASTATIN CALCIUM 5 MG PO TABS: 5.0000 mg | ORAL_TABLET | ORAL | 3 refills | Status: DC

## 2017-09-28 MED ORDER — AMLODIPINE BESYLATE 10 MG PO TABS: 10.0000 mg | ORAL_TABLET | Freq: Every day | ORAL | 3 refills | Status: DC

## 2017-09-28 MED ORDER — LOSARTAN POTASSIUM 50 MG PO TABS: 50.0000 mg | ORAL_TABLET | Freq: Every day | ORAL | 3 refills | Status: DC

## 2017-09-28 NOTE — Patient Instructions (Signed)
Go to the lab on the way out.  We'll contact you with your lab report. Don't change your meds for now.  Update me if you are getting lightheaded.  We might need to change your BP meds then.   Take care.  Glad to see you.  Thanks for your effort.   Let me know when you want to go for a bone density test or if you need help getting an appointment with the GI clinic.

## 2017-09-28 NOTE — Progress Notes (Signed)
R neck and shoulder pain.  She was lifting her husband prev but is some better now.  He isn't eating much, he is on hopsice care at home.  "It's hard."  Condolences offered, d/w pt.  Her daughter in law is helping some in the AMs.  I offered support.  She'll update me as needed.    Hearing aid declined, d/w pt.   Memory d/w pt.  occ lapses, but no red flag events.  D/w pt.  We agreed to follow clinically and she'll update me as needed.   Vaccines d/w pt.  Shingles out of stock.   D/w pt about colonoscopy.  She can consider and talk to GI.   D/w pt about DXA.  She wanted to defer for now given her husband is on hospice.  This is reasonable and I'll await update from patient.   Advance directive d/w pt.  Would have her son designated if patient were incapacitated  Discussed with patient about adding on vitamin D.  See med list.  Hypertension:    Using medication without problems or lightheadedness: yes Chest pain with exertion:no Edema:no Short of breath:no  Elevated Cholesterol: Using medications without problems: yes Muscle aches: no Diet compliance: encouraged.    Exercise: encouraged  She is fatigued.  She isn't passing any blood.  No black stools.    Meds, vitals, and allergies reviewed.   PMH and SH reviewed  ROS: Per HPI unless specifically indicated in ROS section   GEN: nad, alert and oriented HEENT: mucous membranes moist NECK: supple w/o LA CV: rrr. PULM: ctab, no inc wob ABD: soft, +bs EXT: no edema SKIN: no acute rash

## 2017-09-30 NOTE — Assessment & Plan Note (Signed)
Advance directive d/w pt.  Would have her son designated if patient were incapacitated

## 2017-09-30 NOTE — Assessment & Plan Note (Signed)
Reasonable control.  No change in meds.  Labs discussed with patient.  She agrees.

## 2017-09-30 NOTE — Assessment & Plan Note (Signed)
See above.  Support offered.  Hospice is helping some.  She has some support from her family.  She will update me as needed.  I appreciate the help of all involved.

## 2017-09-30 NOTE — Assessment & Plan Note (Signed)
See notes on follow-up labs.  Likely related to caregiver strain.

## 2017-09-30 NOTE — Assessment & Plan Note (Signed)
Hearing aid declined, d/w pt.   Memory d/w pt.  occ lapses, but no red flag events.  D/w pt.  We agreed to follow clinically and she'll update me as needed.   Vaccines d/w pt.  Shingles out of stock.   D/w pt about colonoscopy.  She can consider and talk to GI.   D/w pt about DXA.  She wanted to defer for now given her husband is on hospice.  This is reasonable and I'll await update from patient.   Advance directive d/w pt.  Would have her son designated if patient were incapacitated

## 2017-11-30 ENCOUNTER — Ambulatory Visit (INDEPENDENT_AMBULATORY_CARE_PROVIDER_SITE_OTHER): Payer: Medicare Other

## 2017-11-30 DIAGNOSIS — Z23 Encounter for immunization: Secondary | ICD-10-CM | POA: Diagnosis not present

## 2018-01-03 DIAGNOSIS — Z90710 Acquired absence of both cervix and uterus: Secondary | ICD-10-CM | POA: Diagnosis not present

## 2018-01-03 DIAGNOSIS — Z803 Family history of malignant neoplasm of breast: Secondary | ICD-10-CM | POA: Diagnosis not present

## 2018-01-03 DIAGNOSIS — Z1231 Encounter for screening mammogram for malignant neoplasm of breast: Secondary | ICD-10-CM | POA: Diagnosis not present

## 2018-01-03 DIAGNOSIS — M85852 Other specified disorders of bone density and structure, left thigh: Secondary | ICD-10-CM | POA: Diagnosis not present

## 2018-01-03 DIAGNOSIS — Z85038 Personal history of other malignant neoplasm of large intestine: Secondary | ICD-10-CM | POA: Diagnosis not present

## 2018-01-03 LAB — HM MAMMOGRAPHY

## 2018-01-09 ENCOUNTER — Other Ambulatory Visit: Payer: Self-pay | Admitting: Gastroenterology

## 2018-01-09 DIAGNOSIS — R1310 Dysphagia, unspecified: Secondary | ICD-10-CM

## 2018-01-09 DIAGNOSIS — Z85038 Personal history of other malignant neoplasm of large intestine: Secondary | ICD-10-CM | POA: Diagnosis not present

## 2018-01-10 DIAGNOSIS — H1013 Acute atopic conjunctivitis, bilateral: Secondary | ICD-10-CM | POA: Diagnosis not present

## 2018-01-10 DIAGNOSIS — Z961 Presence of intraocular lens: Secondary | ICD-10-CM | POA: Diagnosis not present

## 2018-01-10 DIAGNOSIS — H35363 Drusen (degenerative) of macula, bilateral: Secondary | ICD-10-CM | POA: Diagnosis not present

## 2018-01-10 DIAGNOSIS — H02403 Unspecified ptosis of bilateral eyelids: Secondary | ICD-10-CM | POA: Diagnosis not present

## 2018-01-11 ENCOUNTER — Encounter: Payer: Self-pay | Admitting: Family Medicine

## 2018-01-12 ENCOUNTER — Ambulatory Visit
Admission: RE | Admit: 2018-01-12 | Discharge: 2018-01-12 | Disposition: A | Discharge status: Home / Self Care | Payer: Medicare Other | Source: Ambulatory Visit | Attending: Gastroenterology | Admitting: Gastroenterology

## 2018-01-12 DIAGNOSIS — R1310 Dysphagia, unspecified: Secondary | ICD-10-CM

## 2018-01-12 DIAGNOSIS — K449 Diaphragmatic hernia without obstruction or gangrene: Secondary | ICD-10-CM | POA: Diagnosis not present

## 2018-02-06 DIAGNOSIS — K5730 Diverticulosis of large intestine without perforation or abscess without bleeding: Secondary | ICD-10-CM | POA: Diagnosis not present

## 2018-02-06 DIAGNOSIS — Z85038 Personal history of other malignant neoplasm of large intestine: Secondary | ICD-10-CM | POA: Diagnosis not present

## 2018-02-06 DIAGNOSIS — K640 First degree hemorrhoids: Secondary | ICD-10-CM | POA: Diagnosis not present

## 2018-02-06 LAB — HM COLONOSCOPY

## 2018-02-22 ENCOUNTER — Encounter: Payer: Self-pay | Admitting: Family Medicine

## 2018-02-28 DIAGNOSIS — C50919 Malignant neoplasm of unspecified site of unspecified female breast: Secondary | ICD-10-CM

## 2018-02-28 HISTORY — DX: Malignant neoplasm of unspecified site of unspecified female breast: C50.919

## 2018-03-27 ENCOUNTER — Other Ambulatory Visit: Payer: Self-pay | Admitting: *Deleted

## 2018-03-27 MED ORDER — LOSARTAN POTASSIUM 50 MG PO TABS: 50.0000 mg | ORAL_TABLET | Freq: Every day | ORAL | 2 refills | Status: DC

## 2018-03-29 ENCOUNTER — Other Ambulatory Visit: Payer: Self-pay | Admitting: Family Medicine

## 2018-03-29 DIAGNOSIS — E7800 Pure hypercholesterolemia, unspecified: Secondary | ICD-10-CM

## 2018-04-26 ENCOUNTER — Other Ambulatory Visit: Payer: Self-pay | Admitting: General Practice

## 2018-04-26 DIAGNOSIS — I10 Essential (primary) hypertension: Secondary | ICD-10-CM

## 2018-04-26 MED ORDER — LOSARTAN POTASSIUM 25 MG PO TABS: 50.0000 mg | ORAL_TABLET | Freq: Every day | ORAL | 1 refills | Status: DC

## 2018-08-17 DIAGNOSIS — G8929 Other chronic pain: Secondary | ICD-10-CM | POA: Diagnosis not present

## 2018-08-17 DIAGNOSIS — Z7982 Long term (current) use of aspirin: Secondary | ICD-10-CM | POA: Diagnosis not present

## 2018-08-17 DIAGNOSIS — Z85038 Personal history of other malignant neoplasm of large intestine: Secondary | ICD-10-CM | POA: Diagnosis not present

## 2018-08-17 DIAGNOSIS — I10 Essential (primary) hypertension: Secondary | ICD-10-CM | POA: Diagnosis not present

## 2018-08-17 DIAGNOSIS — E785 Hyperlipidemia, unspecified: Secondary | ICD-10-CM | POA: Diagnosis not present

## 2018-08-17 DIAGNOSIS — Z803 Family history of malignant neoplasm of breast: Secondary | ICD-10-CM | POA: Diagnosis not present

## 2018-09-20 ENCOUNTER — Other Ambulatory Visit: Payer: Self-pay | Admitting: Family Medicine

## 2018-09-26 ENCOUNTER — Other Ambulatory Visit (INDEPENDENT_AMBULATORY_CARE_PROVIDER_SITE_OTHER): Payer: Medicare HMO

## 2018-09-26 ENCOUNTER — Ambulatory Visit: Payer: Medicare Other

## 2018-09-26 ENCOUNTER — Other Ambulatory Visit: Payer: Medicare HMO

## 2018-09-26 ENCOUNTER — Other Ambulatory Visit: Payer: Self-pay | Admitting: Family Medicine

## 2018-09-26 DIAGNOSIS — E7800 Pure hypercholesterolemia, unspecified: Secondary | ICD-10-CM

## 2018-09-26 DIAGNOSIS — M899 Disorder of bone, unspecified: Secondary | ICD-10-CM

## 2018-09-26 LAB — COMPREHENSIVE METABOLIC PANEL
ALT: 10 U/L (ref 0–35)
AST: 16 U/L (ref 0–37)
Albumin: 4.1 g/dL (ref 3.5–5.2)
Alkaline Phosphatase: 75 U/L (ref 39–117)
BUN: 15 mg/dL (ref 6–23)
CO2: 27 mEq/L (ref 19–32)
Calcium: 9.5 mg/dL (ref 8.4–10.5)
Chloride: 103 mEq/L (ref 96–112)
Creatinine, Ser: 0.88 mg/dL (ref 0.40–1.20)
GFR: 61.4 mL/min (ref >60.00)
Glucose, Bld: 111 mg/dL — ABNORMAL HIGH (ref 70–99)
Potassium: 3.8 mEq/L (ref 3.5–5.1)
Sodium: 138 mEq/L (ref 135–145)
Total Bilirubin: 0.4 mg/dL (ref 0.2–1.2)
Total Protein: 7 g/dL (ref 6.0–8.3)

## 2018-09-26 LAB — LIPID PANEL
Cholesterol: 219 mg/dL — ABNORMAL HIGH (ref 0–200)
HDL: 51.8 mg/dL (ref >39.00)
NonHDL: 167.12
Total CHOL/HDL Ratio: 4
Triglycerides: 257 mg/dL — ABNORMAL HIGH (ref 0.0–149.0)
VLDL: 51.4 mg/dL — ABNORMAL HIGH (ref 0.0–40.0)

## 2018-09-26 LAB — VITAMIN D 25 HYDROXY (VIT D DEFICIENCY, FRACTURES): VITD: 42.07 ng/mL (ref 30.00–100.00)

## 2018-09-26 LAB — LDL CHOLESTEROL, DIRECT: Direct LDL: 139 mg/dL

## 2018-10-01 ENCOUNTER — Encounter: Payer: Medicare Other | Admitting: Family Medicine

## 2018-10-04 ENCOUNTER — Other Ambulatory Visit: Payer: Self-pay

## 2018-10-04 ENCOUNTER — Ambulatory Visit (INDEPENDENT_AMBULATORY_CARE_PROVIDER_SITE_OTHER): Payer: Medicare HMO | Admitting: Family Medicine

## 2018-10-04 ENCOUNTER — Encounter: Payer: Self-pay | Admitting: Family Medicine

## 2018-10-04 VITALS — BP 128/88 | HR 90 | Temp 98.2°F | Ht 62.0 in | Wt 132.6 lb

## 2018-10-04 DIAGNOSIS — I119 Hypertensive heart disease without heart failure: Secondary | ICD-10-CM

## 2018-10-04 DIAGNOSIS — E7800 Pure hypercholesterolemia, unspecified: Secondary | ICD-10-CM

## 2018-10-04 DIAGNOSIS — Z7189 Other specified counseling: Secondary | ICD-10-CM

## 2018-10-04 DIAGNOSIS — Z0000 Encounter for general adult medical examination without abnormal findings: Secondary | ICD-10-CM

## 2018-10-04 MED ORDER — AMLODIPINE BESYLATE 10 MG PO TABS: 10.0000 mg | ORAL_TABLET | Freq: Every day | ORAL | 3 refills | Status: DC

## 2018-10-04 MED ORDER — OMEGA-3-ACID ETHYL ESTERS 1 G PO CAPS: 1.0000 g | ORAL_CAPSULE | Freq: Every day | ORAL | 3 refills | Status: DC

## 2018-10-04 MED ORDER — ROSUVASTATIN CALCIUM 5 MG PO TABS: ORAL_TABLET | ORAL | 3 refills | Status: DC

## 2018-10-04 NOTE — Progress Notes (Signed)
I have personally reviewed the Medicare Annual Wellness questionnaire and have noted 1. The patient's medical and social history 2. Their use of alcohol, tobacco or illicit drugs 3. Their current medications and supplements 4. The patient's functional ability including ADL's, fall risks, home safety risks and hearing or visual             impairment. 5. Diet and physical activities 6. Evidence for depression or mood disorders  The patients weight, height, BMI have been recorded in the chart and visual acuity is per eye clinic.  I have made referrals, counseling and provided education to the patient based review of the above and I have provided the pt with a written personalized care plan for preventive services.  Provider list updated- see scanned forms.  Routine anticipatory guidance given to patient.  See health maintenance. The possibility exists that previously documented standard health maintenance information may have been brought forward from a previous encounter into this note.  If needed, that same information has been updated to reflect the current situation based on today's encounter.    Flu yearly Shingles discussed with patient PNA up-to-date Tetanus 2012. Colon cancer screening 2019 Breast cancer screening 2019 Bone density test 2019 Advance directive-son designated if patient were incapacitated. Cognitive function addressed- see scanned forms- and if abnormal then additional documentation follows.   Hypertension:    Using medication without problems or lightheadedness: see below. She but back on losartan to 25mg  and feels better.   Chest pain with exertion:no Edema:no Short of breath:no She had lower BP readings and would episodically get lightheaded while on 50 mg of losartan.  Elevated Cholesterol: Using medications without problems:yes Muscle aches: some occ back pain but likely not from statin.  Noted more with housework.   Diet compliance: yes Exercise:  yes Labs d/w pt.  She has been off lovaza.    PMH and SH reviewed  Meds, vitals, and allergies reviewed.   ROS: Per HPI.  Unless specifically indicated otherwise in HPI, the patient denies:  General: fever. Eyes: acute vision changes ENT: sore throat Cardiovascular: chest pain Respiratory: SOB GI: vomiting GU: dysuria Musculoskeletal: acute back pain Derm: acute rash Neuro: acute motor dysfunction Psych: worsening mood Endocrine: polydipsia Heme: bleeding Allergy: hayfever  GEN: nad, alert and oriented HEENT: ncat NECK: supple w/o LA CV: rrr. PULM: ctab, no inc wob ABD: soft, +bs EXT: no edema SKIN: no acute rash  Health Maintenance  Topic Date Due  . INFLUENZA VACCINE  09/29/2018  . TETANUS/TDAP  06/21/2020  . COLONOSCOPY  02/07/2023  . DEXA SCAN  Completed  . PNA vac Low Risk Adult  Completed

## 2018-10-04 NOTE — Patient Instructions (Addendum)
If you find out which blood pressure cuff is covered, then I'll send an order for that.  Stop the losartan for now and keep checking your BP.  If consistently >140/>90 then let me know.    Mammogram due in November of 2020.   Restart lovaza.  Update me as needed.  Take care.  Glad to see you.

## 2018-10-08 DIAGNOSIS — Z0000 Encounter for general adult medical examination without abnormal findings: Secondary | ICD-10-CM | POA: Insufficient documentation

## 2018-10-08 NOTE — Assessment & Plan Note (Signed)
She had lower BP readings and would episodically get lightheaded while on 50 mg of losartan.  She cut back to 25 mg and feels better. Discussed getting a blood pressure cuff If she can find out which blood pressure cuff is covered, then I'll send an order for that.  Discussed with patient about stopping the losartan for now and keep checking her BP.  If consistently >140/>90 then she will let me know.  She agrees with plan.  Goal to avoid hypotension.

## 2018-10-08 NOTE — Assessment & Plan Note (Signed)
She has been off lovaza.  Restart.  Continue Crestor twice a week.  Continue work on diet and exercise.  Update me as needed.  Labs discussed with patient.  She agrees.

## 2018-10-08 NOTE — Assessment & Plan Note (Signed)
Flu yearly Shingles discussed with patient PNA up-to-date Tetanus 2012. Colon cancer screening 2019 Breast cancer screening 2019 Bone density test 2019 Advance directive-son designated if patient were incapacitated. Cognitive function addressed- see scanned forms- and if abnormal then additional documentation follows.

## 2018-11-22 DIAGNOSIS — R69 Illness, unspecified: Secondary | ICD-10-CM | POA: Diagnosis not present

## 2018-12-16 ENCOUNTER — Other Ambulatory Visit: Payer: Self-pay | Admitting: Family Medicine

## 2018-12-17 NOTE — Telephone Encounter (Signed)
Losartan is not on current med list... please advise if pt is to be taking medication

## 2018-12-18 NOTE — Telephone Encounter (Signed)
No answer, no VM

## 2018-12-18 NOTE — Telephone Encounter (Signed)
Verify with patient.  I thought she was going to stop this medication, check her blood pressure and then update [place] as needed.  Thanks.

## 2018-12-18 NOTE — Telephone Encounter (Signed)
Patient is no longer taking this medication and BP has been fine.

## 2018-12-25 ENCOUNTER — Other Ambulatory Visit: Payer: Self-pay | Admitting: *Deleted

## 2018-12-25 NOTE — Telephone Encounter (Signed)
Faxed refill request. Losartan 50 mg Last office visit:   10/04/2018 CPE Last Filled:   Medication is not on patient's current meds list   Please advise.

## 2018-12-26 DIAGNOSIS — R69 Illness, unspecified: Secondary | ICD-10-CM | POA: Diagnosis not present

## 2019-01-01 DIAGNOSIS — R69 Illness, unspecified: Secondary | ICD-10-CM | POA: Diagnosis not present

## 2019-01-09 DIAGNOSIS — Z803 Family history of malignant neoplasm of breast: Secondary | ICD-10-CM | POA: Diagnosis not present

## 2019-01-09 DIAGNOSIS — Z1231 Encounter for screening mammogram for malignant neoplasm of breast: Secondary | ICD-10-CM | POA: Diagnosis not present

## 2019-01-16 DIAGNOSIS — Z961 Presence of intraocular lens: Secondary | ICD-10-CM | POA: Diagnosis not present

## 2019-01-16 DIAGNOSIS — H35033 Hypertensive retinopathy, bilateral: Secondary | ICD-10-CM | POA: Diagnosis not present

## 2019-01-16 DIAGNOSIS — H43813 Vitreous degeneration, bilateral: Secondary | ICD-10-CM | POA: Diagnosis not present

## 2019-01-16 DIAGNOSIS — H353131 Nonexudative age-related macular degeneration, bilateral, early dry stage: Secondary | ICD-10-CM | POA: Diagnosis not present

## 2019-01-17 DIAGNOSIS — N6312 Unspecified lump in the right breast, upper inner quadrant: Secondary | ICD-10-CM | POA: Diagnosis not present

## 2019-02-05 ENCOUNTER — Other Ambulatory Visit: Payer: Self-pay | Admitting: Radiology

## 2019-02-05 DIAGNOSIS — C50211 Malignant neoplasm of upper-inner quadrant of right female breast: Secondary | ICD-10-CM | POA: Diagnosis not present

## 2019-02-05 DIAGNOSIS — N6312 Unspecified lump in the right breast, upper inner quadrant: Secondary | ICD-10-CM | POA: Diagnosis not present

## 2019-02-07 ENCOUNTER — Telehealth: Payer: Self-pay | Admitting: *Deleted

## 2019-02-07 NOTE — Telephone Encounter (Signed)
She could try OTC hydrocortisone to see if that helps more.  I would try that first in the absence of any rash.  Would offer OV if sx continue.  Thanks.

## 2019-02-07 NOTE — Telephone Encounter (Signed)
Patient says that for the last month or so, each night around 7 pm, her toes start itching.  She says she has to massage them really hard and apply a Benadryl cream before she gets relief.  Patient states that back in August or September, she slipped on some shoes that were in the garage to go to the mailbox and something bit her at that time but it didn't seem to be a problem.  Then later, her toe on the left foot started itching, all of her toes except the big toe started itching on the left foot and now it has moved to the toes on the right foot.  I mentioned an anti-fungal cream like Lotrimin OTC.  Patient says she doesn't think she needs to come in because there's nothing to see, they just start itching each evening.

## 2019-02-07 NOTE — Telephone Encounter (Signed)
Patient advised.

## 2019-02-08 ENCOUNTER — Other Ambulatory Visit: Payer: Self-pay | Admitting: General Surgery

## 2019-02-08 DIAGNOSIS — Z170 Estrogen receptor positive status [ER+]: Secondary | ICD-10-CM

## 2019-02-08 DIAGNOSIS — C50211 Malignant neoplasm of upper-inner quadrant of right female breast: Secondary | ICD-10-CM | POA: Diagnosis not present

## 2019-02-13 ENCOUNTER — Encounter: Payer: Self-pay | Admitting: Adult Health

## 2019-02-13 DIAGNOSIS — C50211 Malignant neoplasm of upper-inner quadrant of right female breast: Secondary | ICD-10-CM | POA: Insufficient documentation

## 2019-02-13 DIAGNOSIS — Z170 Estrogen receptor positive status [ER+]: Secondary | ICD-10-CM | POA: Insufficient documentation

## 2019-02-14 ENCOUNTER — Telehealth: Payer: Self-pay | Admitting: Hematology and Oncology

## 2019-02-14 NOTE — Telephone Encounter (Signed)
Received referrals from Dr. [name] for med onc and genetics. Pt has been cld and scheduled to see Dr. Lindi Adie on 12/21 at 1pm and genetics at 2pm. Pt aware to arrive 15 minutes early.

## 2019-02-15 ENCOUNTER — Other Ambulatory Visit: Payer: Self-pay | Admitting: Licensed Clinical Social Worker

## 2019-02-15 DIAGNOSIS — C50211 Malignant neoplasm of upper-inner quadrant of right female breast: Secondary | ICD-10-CM

## 2019-02-15 DIAGNOSIS — Z85038 Personal history of other malignant neoplasm of large intestine: Secondary | ICD-10-CM

## 2019-02-15 DIAGNOSIS — Z170 Estrogen receptor positive status [ER+]: Secondary | ICD-10-CM

## 2019-02-16 ENCOUNTER — Ambulatory Visit
Admission: RE | Admit: 2019-02-16 | Discharge: 2019-02-16 | Disposition: A | Discharge status: Home / Self Care | Payer: Medicare HMO | Source: Ambulatory Visit | Attending: General Surgery | Admitting: General Surgery

## 2019-02-16 ENCOUNTER — Other Ambulatory Visit: Payer: Self-pay

## 2019-02-16 DIAGNOSIS — C50211 Malignant neoplasm of upper-inner quadrant of right female breast: Secondary | ICD-10-CM | POA: Diagnosis not present

## 2019-02-16 DIAGNOSIS — Z170 Estrogen receptor positive status [ER+]: Secondary | ICD-10-CM

## 2019-02-16 MED ORDER — GADOBUTROL 1 MMOL/ML IV SOLN
6.0000 mL | Freq: Once | INTRAVENOUS | Status: AC | PRN
  Administered 2019-02-16: 6 mL via INTRAVENOUS

## 2019-02-17 NOTE — Progress Notes (Signed)
[facility] CONSULT NOTE  Patient Care Team: Tonia Ghent, MD as PCP - General (Family Medicine) Monna Fam, MD as Consulting Physician (Ophthalmology) Laurence Spates, MD as Consulting Physician (Gastroenterology) Henreitta Leber, DDS as Referring Physician (Dentistry)  CHIEF COMPLAINTS/PURPOSE OF CONSULTATION:  Newly diagnosed breast cancer  HISTORY OF PRESENTING ILLNESS:  Amber [name] 85 y.o. female is here because of recent diagnosis of invasive ductal carcinoma of the right breast. Screening mammogram detected a 1.5cm mass in the upper inner right breast. Diagnostic mammogram on 01/17/19 showed the mass measuring 2.3cm at the 1 o'clock position in the right breast. Biopsy on 02/08/19 showed invasive ductal carcinoma, grade 2, HER-2 positive by FISH, ER+ 100%, PR+ 90%, Ki67 15%. She has a family history of breast cancer in her mother at 50yo. She presents to the clinic today for initial evaluation and discussion of treatment options.   I reviewed her records extensively and collaborated the history with the patient.  SUMMARY OF ONCOLOGIC HISTORY: Oncology History  Malignant neoplasm of upper-inner quadrant of right breast in female, estrogen receptor positive ([place])  02/13/2019 Initial Diagnosis   Routine screening mammogram detected 1.5cm right breast mass, 2.3cm on diagnostic mammogram, at the 1 o'clock position. Biopsy showed IDC, grade 2, HER-2 + by FISH, ER+ 100%, PR+ 90%, Ki67 15%.       MEDICAL HISTORY:  Past Medical History:  Diagnosis Date  . Anemia   . Blood transfusion without reported diagnosis    during treatment for colon CA  . C. difficile colitis    history of  . Cancer [facility])    Colon, s/p partial colectomy, no chemo or radiation  . Diverticulitis   . Hyperlipidemia   . Hypertension   . Urinary incontinence    stress incont.  . Urinary tract infection     SURGICAL HISTORY: Past Surgical History:  Procedure Laterality Date  .  ABDOMINAL HYSTERECTOMY    . BLADDER SUSPENSION    . CARDIOVASCULAR STRESS TEST  11/02/2004   EF 76%  . CATARACT EXTRACTION    . COLON SURGERY      SOCIAL HISTORY: Social History   Socioeconomic History  . Marital status: Married    Spouse name: Not on file  . Number of children: Not on file  . Years of education: Not on file  . Highest education level: Not on file  Occupational History  . Not on file  Tobacco Use  . Smoking status: Never Smoker  . Smokeless tobacco: Never Used  Substance and Sexual Activity  . Alcohol use: No  . Drug use: No  . Sexual activity: Never  Other Topics Concern  . Not on file  Social History Narrative   Married 1956   1 son, local   Social Determinants of Health   Financial Resource Strain:   . Difficulty of Paying Living Expenses: Not on file  Food Insecurity:   . Worried About Charity fundraiser in the Last Year: Not on file  . Ran Out of Food in the Last Year: Not on file  Transportation Needs:   . Lack of Transportation (Medical): Not on file  . Lack of Transportation (Non-Medical): Not on file  Physical Activity:   . Days of Exercise per Week: Not on file  . Minutes of Exercise per Session: Not on file  Stress:   . Feeling of Stress : Not on file  Social Connections:   . Frequency of Communication with Friends and Family: Not  on file  . Frequency of Social Gatherings with Friends and Family: Not on file  . Attends Religious Services: Not on file  . Active Member of Clubs or Organizations: Not on file  . Attends Archivist Meetings: Not on file  . Marital Status: Not on file  Intimate Partner Violence:   . Fear of Current or Ex-Partner: Not on file  . Emotionally Abused: Not on file  . Physically Abused: Not on file  . Sexually Abused: Not on file    FAMILY HISTORY: Family History  Problem Relation Age of Onset  . Cancer Mother        breast cancer  . Breast cancer Mother   . Cancer Father        colon cancer   . Stroke Father   . Colon cancer Father   . Cancer Brother        bladder cancer  . Cancer Brother        throat cancer    ALLERGIES:  is allergic to ace inhibitors; escitalopram oxalate; lipitor [atorvastatin calcium]; zetia [ezetimibe]; and zocor [simvastatin - high dose].  MEDICATIONS:  Current Outpatient Medications  Medication Sig Dispense Refill  . amLODipine (NORVASC) 10 MG tablet Take 1 tablet (10 mg total) by mouth daily. 90 tablet 3  . aspirin 325 MG tablet Take 325 mg by mouth daily.    . Cholecalciferol 1000 units tablet Take 1 tablet (1,000 Units total) by mouth daily.    . Multiple Vitamin (MULTIVITAMIN) tablet Take 1 tablet by mouth daily.      Marland Kitchen omega-3 acid ethyl esters (LOVAZA) 1 g capsule Take 1 capsule (1 g total) by mouth daily. 90 capsule 3  . rosuvastatin (CRESTOR) 5 MG tablet TAKE 1 TABLET BY MOUTH TWICE A WEEK 45 tablet 3   No current facility-administered medications for this visit.    REVIEW OF SYSTEMS:   Constitutional: Denies fevers, chills or abnormal night sweats Eyes: Denies blurriness of vision, double vision or watery eyes Ears, nose, mouth, throat, and face: Denies mucositis or sore throat Respiratory: Denies cough, dyspnea or wheezes Cardiovascular: Denies palpitation, chest discomfort or lower extremity swelling Gastrointestinal:  Denies nausea, heartburn or change in bowel habits Skin: Denies abnormal skin rashes Lymphatics: Denies new lymphadenopathy or easy bruising Neurological:Denies numbness, tingling or new weaknesses Behavioral/Psych: Mood is stable, no new changes  Breast: Denies any palpable lumps or discharge All other systems were reviewed with the patient and are negative.  PHYSICAL EXAMINATION: ECOG PERFORMANCE STATUS: 1 - Symptomatic but completely ambulatory  Vitals:   02/18/19 1245  BP: 137/76  Pulse: 88  Resp: 17  Temp: 97.8 F (36.6 C)  SpO2: 97%   Filed Weights   02/18/19 1245  Weight: 132 lb 12.8 oz (60.2  kg)    GENERAL:alert, no distress and comfortable SKIN: skin color, texture, turgor are normal, no rashes or significant lesions EYES: normal, conjunctiva are pink and non-injected, sclera clear OROPHARYNX:no exudate, no erythema and lips, buccal mucosa, and tongue normal  NECK: supple, thyroid normal size, non-tender, without nodularity LYMPH:  no palpable lymphadenopathy in the cervical, axillary or inguinal LUNGS: clear to auscultation and percussion with normal breathing effort HEART: regular rate & rhythm and no murmurs and no lower extremity edema ABDOMEN:abdomen soft, non-tender and normal bowel sounds Musculoskeletal:no cyanosis of digits and no clubbing  PSYCH: alert & oriented x 3 with fluent speech NEURO: no focal motor/sensory deficits BREAST: No palpable nodules in breast. No palpable axillary or  supraclavicular lymphadenopathy (exam performed in the presence of a chaperone)   LABORATORY DATA:  I have reviewed the data as listed Lab Results  Component Value Date   WBC 8.3 09/28/2017   HGB 14.0 09/28/2017   HCT 40.3 09/28/2017   MCV 88.6 09/28/2017   PLT 298.0 09/28/2017   Lab Results  Component Value Date   NA 138 09/26/2018   K 3.8 09/26/2018   CL 103 09/26/2018   CO2 27 09/26/2018    RADIOGRAPHIC STUDIES: I have personally reviewed the radiological reports and agreed with the findings in the report.  ASSESSMENT AND PLAN:  Malignant neoplasm of upper-inner quadrant of right breast in female, estrogen receptor positive ([place]) 02/13/2019:Routine screening mammogram detected 1.5cm right breast mass, 2.3cm on diagnostic mammogram, at the 1 o'clock position. Biopsy showed IDC, grade 2, HER-2 + by FISH, ER+ 100%, PR+ 90%, Ki67 15%.  T2 N0 stage Ib  Pathology and radiology counseling:Discussed with the patient, the details of pathology including the type of breast cancer,the clinical staging, the significance of ER, PR and HER-2/neu receptors and the implications for  treatment. After reviewing the pathology in detail, we proceeded to discuss the different treatment options between surgery, radiation, and, antiestrogen therapies.  Recommendations: 1.  Patient is leaning towards doing a mastectomy and avoid doing radiation. 2.  Adjuvant Herceptin every 3 weeks.  We discussed intravenous versus subcutaneous Herceptin.  We will try to do it subcutaneously if it is approved by her insurance. 3. Adjuvant antiestrogen therapy with anastrozole 1 mg daily.   Return to clinic after surgery to discuss final pathology report      All questions were answered. The patient knows to call the clinic with any problems, questions or concerns.   Rulon Eisenmenger, MD, MPH 02/18/2019    I, Molly Dorshimer, am acting as scribe for Nicholas Lose, MD.  I have reviewed the above document for accuracy and completeness, and I agree with the above.

## 2019-02-18 ENCOUNTER — Inpatient Hospital Stay (HOSPITAL_BASED_OUTPATIENT_CLINIC_OR_DEPARTMENT_OTHER): Payer: Medicare HMO | Admitting: Licensed Clinical Social Worker

## 2019-02-18 ENCOUNTER — Inpatient Hospital Stay: Payer: Medicare HMO

## 2019-02-18 ENCOUNTER — Other Ambulatory Visit: Payer: Self-pay

## 2019-02-18 ENCOUNTER — Encounter: Payer: Self-pay | Admitting: Licensed Clinical Social Worker

## 2019-02-18 ENCOUNTER — Telehealth: Payer: Self-pay | Admitting: Family Medicine

## 2019-02-18 ENCOUNTER — Inpatient Hospital Stay: Payer: Medicare HMO | Attending: Hematology and Oncology | Admitting: Hematology and Oncology

## 2019-02-18 DIAGNOSIS — Z170 Estrogen receptor positive status [ER+]: Secondary | ICD-10-CM

## 2019-02-18 DIAGNOSIS — Z1379 Encounter for other screening for genetic and chromosomal anomalies: Secondary | ICD-10-CM | POA: Diagnosis not present

## 2019-02-18 DIAGNOSIS — E785 Hyperlipidemia, unspecified: Secondary | ICD-10-CM | POA: Diagnosis not present

## 2019-02-18 DIAGNOSIS — Z800 Family history of malignant neoplasm of digestive organs: Secondary | ICD-10-CM | POA: Diagnosis not present

## 2019-02-18 DIAGNOSIS — C50211 Malignant neoplasm of upper-inner quadrant of right female breast: Secondary | ICD-10-CM | POA: Diagnosis not present

## 2019-02-18 DIAGNOSIS — I10 Essential (primary) hypertension: Secondary | ICD-10-CM | POA: Diagnosis not present

## 2019-02-18 DIAGNOSIS — Z8052 Family history of malignant neoplasm of bladder: Secondary | ICD-10-CM | POA: Insufficient documentation

## 2019-02-18 DIAGNOSIS — Z79899 Other long term (current) drug therapy: Secondary | ICD-10-CM | POA: Diagnosis not present

## 2019-02-18 DIAGNOSIS — Z803 Family history of malignant neoplasm of breast: Secondary | ICD-10-CM | POA: Insufficient documentation

## 2019-02-18 DIAGNOSIS — Z7982 Long term (current) use of aspirin: Secondary | ICD-10-CM | POA: Insufficient documentation

## 2019-02-18 DIAGNOSIS — Z85038 Personal history of other malignant neoplasm of large intestine: Secondary | ICD-10-CM

## 2019-02-18 LAB — GENETIC SCREENING ORDER

## 2019-02-18 NOTE — Telephone Encounter (Signed)
Called and LMVOM for patient, just to check on her given recent developments.  No confidential info left on voice mail.

## 2019-02-18 NOTE — Assessment & Plan Note (Signed)
02/13/2019:Routine screening mammogram detected 1.5cm right breast mass, 2.3cm on diagnostic mammogram, at the 1 o'clock position. Biopsy showed IDC, grade 2, HER-2 + by FISH, ER+ 100%, PR+ 90%, Ki67 15%.  T2 N0 stage Ib  Pathology and radiology counseling:Discussed with the patient, the details of pathology including the type of breast cancer,the clinical staging, the significance of ER, PR and HER-2/neu receptors and the implications for treatment. After reviewing the pathology in detail, we proceeded to discuss the different treatment options between surgery, radiation, and, antiestrogen therapies.  Recommendations: 1. Breast conserving surgery followed by 2. +/-Adjuvant radiation therapy followed by 3. Adjuvant antiestrogen therapy   Return to clinic after surgery to discuss final pathology report

## 2019-02-18 NOTE — Progress Notes (Signed)
REFERRING PROVIDER: Rolm Bookbinder, MD [address] [place],  [place] [address]  PRIMARY PROVIDER:  Tonia Ghent, MD  PRIMARY REASON FOR VISIT:  1. Malignant neoplasm of upper-inner quadrant of right breast in female, estrogen receptor positive ([place])   2. Family history of breast cancer   3. Family history of colon cancer   4. Family history of throat cancer   5. Family history of bladder cancer      HISTORY OF PRESENT ILLNESS:   Ms. Queener, a 85 y.o. female, was seen for a [facility] cancer genetics consultation at the request of Dr. [name] due to a personal and family history of cancer.  Ms. Devito presents to clinic today to discuss the possibility of a hereditary predisposition to cancer, genetic testing, and to further clarify her future cancer risks, as well as potential cancer risks for family members.   In 2010, at the age of 27, Ms. Balzarini was diagnosed with colon cancer. This was treated with surgery. In 2020, at the age of 50, Ms. Gorby was diagnosed with IDC of the right breast, ER/PR+, Her2+. The treatment plan includes surgery, adjuvant herceptin and adjuvant antiestrogen therapy. Ms. Priego reports she is leaning towards mastectomy.    CANCER HISTORY:  Oncology History  Malignant neoplasm of upper-inner quadrant of right breast in female, estrogen receptor positive ([place])  02/13/2019 Initial Diagnosis   Routine screening mammogram detected 1.5cm right breast mass, 2.3cm on diagnostic mammogram, at the 1 o'clock position. Biopsy showed IDC, grade 2, HER-2 + by FISH, ER+ 100%, PR+ 90%, Ki67 15%.    03/21/2019 -  Chemotherapy   The patient had trastuzumab-dkst (OGIVRI) 483 mg in sodium chloride 0.9 % 250 mL chemo infusion, 8 mg/kg = 483 mg, Intravenous,  Once, 0 of 12 cycles  for chemotherapy treatment.       RISK FACTORS:  Menarche was at age 54.  First live birth at age 88.  OCP use for approximately 0 years.  Ovaries intact: no.  Hysterectomy:  yes.  Menopausal status: postmenopausal.  HRT use: 0 years. Colonoscopy: yes; normal. Mammogram within the last year: yes. Number of breast biopsies: 1.  Any excessive radiation exposure in the past: no  Past Medical History:  Diagnosis Date  . Anemia   . Blood transfusion without reported diagnosis    during treatment for colon CA  . C. difficile colitis    history of  . Cancer [facility])    Colon, s/p partial colectomy, no chemo or radiation  . Diverticulitis   . Family history of bladder cancer   . Family history of breast cancer   . Family history of colon cancer   . Family history of throat cancer   . Hyperlipidemia   . Hypertension   . Urinary incontinence    stress incont.  . Urinary tract infection     Past Surgical History:  Procedure Laterality Date  . ABDOMINAL HYSTERECTOMY    . BLADDER SUSPENSION    . CARDIOVASCULAR STRESS TEST  11/02/2004   EF 76%  . CATARACT EXTRACTION    . COLON SURGERY      Social History   Socioeconomic History  . Marital status: Married    Spouse name: Not on file  . Number of children: Not on file  . Years of education: Not on file  . Highest education level: Not on file  Occupational History  . Not on file  Tobacco Use  . Smoking status: Never Smoker  .  Smokeless tobacco: Never Used  Substance and Sexual Activity  . Alcohol use: No  . Drug use: No  . Sexual activity: Never  Other Topics Concern  . Not on file  Social History Narrative   Married 1956   1 son, local   Social Determinants of Health   Financial Resource Strain:   . Difficulty of Paying Living Expenses: Not on file  Food Insecurity:   . Worried About Charity fundraiser in the Last Year: Not on file  . Ran Out of Food in the Last Year: Not on file  Transportation Needs:   . Lack of Transportation (Medical): Not on file  . Lack of Transportation (Non-Medical): Not on file  Physical Activity:   . Days of Exercise per Week: Not on file  . Minutes of  Exercise per Session: Not on file  Stress:   . Feeling of Stress : Not on file  Social Connections:   . Frequency of Communication with Friends and Family: Not on file  . Frequency of Social Gatherings with Friends and Family: Not on file  . Attends Religious Services: Not on file  . Active Member of Clubs or Organizations: Not on file  . Attends Archivist Meetings: Not on file  . Marital Status: Not on file     FAMILY HISTORY:  We obtained a detailed, 4-generation family history.  Significant diagnoses are listed below: Family History  Problem Relation Age of Onset  . Cancer Mother        breast cancer  . Breast cancer Mother   . Cancer Father        colon cancer  . Stroke Father   . Colon cancer Father   . Cancer Brother        bladder cancer  . Cancer Brother        throat cancer   Ms. Lenart has 1 son, 43, who was recently diagnosed with throat cancer and has history of smoking. She has 1 granddaughter who reportedly had negative genetic testing. Ms. Selman had 2 brothers and 3 sisters. One of her brothers had bladder cancer and died at 49, another had throat cancer and died at 57. Her sisters are still living, no cancers.   Ms. Baldonado mother was diagnosed with breast cancer at 50 and died at 28. Patient had 3 maternal uncles, 3 maternal aunts, no cancers she is aware of. No known cancers in maternal cousins but she has limited information. Patient reports maternal grandparents died before she was born so she is unsure their cause of death or age at death.  Ms. Lorino father was diagnosed with colon cancer at 9 and died at 78. Patient had 4 paternal aunts, 3 patternal uncles. One aunt had cancer but she is unsure the type. No known cancers in paternal cousins that she is aware of. Paternal grandparents also passed before she was born so she is unsure cause/age at death.   Ms. Vanmaanen is aware of previous family history of genetic testing for hereditary cancer risks.  Patient's maternal ancestors are of [place] descent, and paternal ancestors are of [place] descent. There is no reported Ashkenazi Jewish ancestry. There is no known consanguinity.  GENETIC COUNSELING ASSESSMENT: Ms. Strader is a 85 y.o. female with a personal and family history which is somewhat suggestive of a hereditary cancer syndrome and predisposition to cancer. We, therefore, discussed and recommended the following at today's visit.   DISCUSSION: We discussed that 5 - 10% of  breast cancer is hereditary, with most cases associated with BRCA1/BRCA2 mutations.  There are other genes that can be associated with hereditary breast cancer syndromes.  There are genes that increase the risk for both breast and colon such as CHEK2.   We discussed that testing is beneficial for several reasons including surgical decision-making for breast cancer, knowing how to follow individuals after completing their treatment, and understand if other family members could be at risk for cancer and allow them to undergo genetic testing.   We reviewed the characteristics, features and inheritance patterns of hereditary cancer syndromes. We also discussed genetic testing, including the appropriate family members to test, the process of testing, insurance coverage and turn-around-time for results. We discussed the implications of a negative, positive and/or variant of uncertain significant result. In order to get genetic test results in a timely manner so that Ms. Stick can use these genetic test results for surgical decisions, we recommended Ms. Bolinger pursue genetic testing for the [facility] Breast Cancer STAT Panel. Once complete, we recommend Ms. Fluckiger pursue reflex genetic testing to the Common Hereditary Cancers gene panel.   The STAT Breast cancer panel offered by [facility] includes sequencing and rearrangement analysis for the following 9 genes:  ATM, BRCA1, BRCA2, CDH1, CHEK2, PALB2, PTEN, STK11 and TP53.    The Common Hereditary  Cancers Panel offered by [facility] includes sequencing and/or deletion duplication testing of the following 48 genes: APC, ATM, AXIN2, BARD1, BMPR1A, BRCA1, BRCA2, BRIP1, CDH1, CDKN2A (p14ARF), CDKN2A (p16INK4a), CKD4, CHEK2, CTNNA1, DICER1, EPCAM (Deletion/duplication testing only), GREM1 (promoter region deletion/duplication testing only), KIT, MEN1, MLH1, MSH2, MSH3, MSH6, MUTYH, NBN, NF1, NHTL1, PALB2, PDGFRA, PMS2, POLD1, POLE, PTEN, RAD50, RAD51C, RAD51D, RNF43, SDHB, SDHC, SDHD, SMAD4, SMARCA4. STK11, TP53, TSC1, TSC2, and VHL.  The following genes were evaluated for sequence changes only: SDHA and HOXB13 c.251G>A variant only.  Based on Ms. Strawderman's personal and family history of cancer, she meets medical criteria for genetic testing. Despite that she meets criteria, she may still have an out of pocket cost.   PLAN: After considering the risks, benefits, and limitations, Ms. Hacking provided informed consent to pursue genetic testing and the blood sample was sent to [facility] for analysis of the Breast Cancer STAT Panel + Common Hereditary Cancers Panel. Results should be available within approximately 1 weeks' time, at which point they will be disclosed by telephone to Ms. Sheard, as will any additional recommendations warranted by these results. Ms. Mauzy will receive a summary of her genetic counseling visit and a copy of her results once available. This information will also be available in Epic.   Ms. Oki questions were answered to her satisfaction today. Our contact information was provided should additional questions or concerns arise. Thank you for the referral and allowing [place] to share in the care of your patient.   Faith Rogue, MS, [facility] Genetic Counselor [email or web address] Phone: [phone]  The patient was seen for a total of 30 minutes in face-to-face genetic counseling.  Drs. Magrinat, Lindi Adie and/or Burr Medico were available for discussion regarding this case.    _______________________________________________________________________ For Office Staff:  Number of people involved in session: 1 Was an Intern/ student involved with case: no

## 2019-02-25 ENCOUNTER — Encounter: Payer: Self-pay | Admitting: *Deleted

## 2019-02-25 ENCOUNTER — Other Ambulatory Visit: Payer: Self-pay | Admitting: General Surgery

## 2019-02-25 DIAGNOSIS — R9389 Abnormal findings on diagnostic imaging of other specified body structures: Secondary | ICD-10-CM

## 2019-02-27 ENCOUNTER — Other Ambulatory Visit: Payer: Self-pay | Admitting: General Surgery

## 2019-02-27 DIAGNOSIS — C50911 Malignant neoplasm of unspecified site of right female breast: Secondary | ICD-10-CM

## 2019-02-28 ENCOUNTER — Encounter: Payer: Self-pay | Admitting: Licensed Clinical Social Worker

## 2019-02-28 ENCOUNTER — Telehealth: Payer: Self-pay | Admitting: Licensed Clinical Social Worker

## 2019-02-28 ENCOUNTER — Ambulatory Visit: Payer: Self-pay | Admitting: Licensed Clinical Social Worker

## 2019-02-28 DIAGNOSIS — Z170 Estrogen receptor positive status [ER+]: Secondary | ICD-10-CM

## 2019-02-28 DIAGNOSIS — C50211 Malignant neoplasm of upper-inner quadrant of right female breast: Secondary | ICD-10-CM

## 2019-02-28 DIAGNOSIS — Z1379 Encounter for other screening for genetic and chromosomal anomalies: Secondary | ICD-10-CM | POA: Insufficient documentation

## 2019-02-28 DIAGNOSIS — Z8052 Family history of malignant neoplasm of bladder: Secondary | ICD-10-CM

## 2019-02-28 DIAGNOSIS — Z803 Family history of malignant neoplasm of breast: Secondary | ICD-10-CM

## 2019-02-28 DIAGNOSIS — Z800 Family history of malignant neoplasm of digestive organs: Secondary | ICD-10-CM

## 2019-02-28 NOTE — Progress Notes (Signed)
HPI:  Amber [name] was previously seen in the [place] clinic due to a personal and family history of cancer and concerns regarding a hereditary predisposition to cancer. Please refer to our prior cancer genetics clinic note for more information regarding our discussion, assessment and recommendations, at the time. Amber [name] recent genetic test results were disclosed to her, as were recommendations warranted by these results. These results and recommendations are discussed in more detail below.  In 2010, at the age of 70, Amber [name] was diagnosed with colon cancer. This was treated with surgery. In 2020, at the age of 67, Amber [name] was diagnosed with IDC of the right breast, ER/PR+, Her2+.   CANCER HISTORY:  Oncology History  Malignant neoplasm of upper-inner quadrant of right breast in female, estrogen receptor positive ([name])  02/13/2019 Initial Diagnosis   Routine screening mammogram detected 1.5cm right breast mass, 2.3cm on diagnostic mammogram, at the 1 o'clock position. Biopsy showed IDC, grade 2, HER-2 + by FISH, ER+ 100%, PR+ 90%, Ki67 15%.    03/21/2019 -  Chemotherapy   The patient had trastuzumab-hyaluronidase-oysk (HERCEPTIN HYLECTA) 600-10000 MG-UNT/5ML chemo SQ injection 600 mg, 600 mg, Subcutaneous,  Once, 0 of 12 cycles  for chemotherapy treatment.     Genetic Testing   Negative genetic testing. No pathogenic variants identified on the [facility] STAT Breast cancer panel + Common Hereditary Cancers Panel. VUS in BRCA2 called c.5378A>G and a VUS in MUTYH called c.925C>T identified. The report date is 02/28/2019.   The STAT Breast cancer panel offered by [facility] includes sequencing and rearrangement analysis for the following 9 genes:  ATM, BRCA1, BRCA2, CDH1, CHEK2, PALB2, PTEN, STK11 and TP53.    The Common Hereditary Cancers Panel offered by [facility] includes sequencing and/or deletion duplication testing of the following 48 genes: APC, ATM, AXIN2, BARD1, BMPR1A,  BRCA1, BRCA2, BRIP1, CDH1, CDKN2A (p14ARF), CDKN2A (p16INK4a), CKD4, CHEK2, CTNNA1, DICER1, EPCAM (Deletion/duplication testing only), GREM1 (promoter region deletion/duplication testing only), KIT, MEN1, MLH1, MSH2, MSH3, MSH6, MUTYH, NBN, NF1, NHTL1, PALB2, PDGFRA, PMS2, POLD1, POLE, PTEN, RAD50, RAD51C, RAD51D, RNF43, SDHB, SDHC, SDHD, SMAD4, SMARCA4. STK11, TP53, TSC1, TSC2, and VHL.  The following genes were evaluated for sequence changes only: SDHA and HOXB13 c.251G>A variant only.     FAMILY HISTORY:  We obtained a detailed, 4-generation family history.  Significant diagnoses are listed below: Family History  Problem Relation Age of Onset  . Cancer Mother        breast cancer  . Breast cancer Mother   . Cancer Father        colon cancer  . Stroke Father   . Colon cancer Father   . Cancer Brother        bladder cancer  . Cancer Brother        throat cancer   Amber [name] has 1 son, 55, who was recently diagnosed with throat cancer and has history of smoking. She has 1 granddaughter who reportedly had negative genetic testing. Amber [name] had 2 brothers and 3 sisters. One of her brothers had bladder cancer and died at 24, another had throat cancer and died at 44. Her sisters are still living, no cancers.   Amber [name] mother was diagnosed with breast cancer at 73 and died at 13. Patient had 3 maternal uncles, 3 maternal aunts, no cancers she is aware of. No known cancers in maternal cousins but she has limited information. Patient reports maternal grandparents died before she was born so she is  unsure their cause of death or age at death.  Amber [name] father was diagnosed with colon cancer at 66 and died at 78. Patient had 4 paternal aunts, 3 patternal uncles. One aunt had cancer but she is unsure the type. No known cancers in paternal cousins that she is aware of. Paternal grandparents also passed before she was born so she is unsure cause/age at death.   Amber [name] is aware of previous  family history of genetic testing for hereditary cancer risks. Patient's maternal ancestors are of [place] descent, and paternal ancestors are of [place] descent. There is no reported Ashkenazi Jewish ancestry. There is no known consanguinity.  GENETIC TEST RESULTS: Genetic testing reported out on 02/28/2019 through the [facility] Breast Cancer STAT Panel + Common hereditary cancer panel found no pathogenic mutations.   The STAT Breast cancer panel offered by [facility] includes sequencing and rearrangement analysis for the following 9 genes:  ATM, BRCA1, BRCA2, CDH1, CHEK2, PALB2, PTEN, STK11 and TP53.    The Common Hereditary Cancers Panel offered by [facility] includes sequencing and/or deletion duplication testing of the following 48 genes: APC, ATM, AXIN2, BARD1, BMPR1A, BRCA1, BRCA2, BRIP1, CDH1, CDKN2A (p14ARF), CDKN2A (p16INK4a), CKD4, CHEK2, CTNNA1, DICER1, EPCAM (Deletion/duplication testing only), GREM1 (promoter region deletion/duplication testing only), KIT, MEN1, MLH1, MSH2, MSH3, MSH6, MUTYH, NBN, NF1, NHTL1, PALB2, PDGFRA, PMS2, POLD1, POLE, PTEN, RAD50, RAD51C, RAD51D, RNF43, SDHB, SDHC, SDHD, SMAD4, SMARCA4. STK11, TP53, TSC1, TSC2, and VHL.  The following genes were evaluated for sequence changes only: SDHA and HOXB13 c.251G>A variant only.  The test report has been scanned into EPIC and is located under the Molecular Pathology section of the Results Review tab.  A portion of the result report is included below for reference.     We discussed with [name] that because current genetic testing is not perfect, it is possible there may be a gene mutation in one of these genes that current testing cannot detect, but that chance is small.  We also discussed, that there could be another gene that has not yet been discovered, or that we have not yet tested, that is responsible for the cancer diagnoses in the family. It is also possible there is a hereditary cause for the cancer in the family that Ms.  [name] did not inherit and therefore was not identified in her testing.  Therefore, it is important to remain in touch with cancer genetics in the future so that we can continue to offer Ms. Paver the most up to date genetic testing.   Genetic testing did identify 2 Variants of uncertain significance (VUS) - one in the BRCA2 gene called c.5378A>G, a second in the MUTYH gene called c.925C>T. At this time, it is unknown if these variants are associated with increased cancer risk or if they are normal findings, but most variants such as these get reclassified to being inconsequential. They should not be used to make medical management decisions. With time, we suspect the lab will determine the significance of these variants, if any. If we do learn more about them, we will try to contact Ms. Portlock to discuss it further. However, it is important to stay in touch with [place] periodically and keep the address and phone number up to date.  ADDITIONAL GENETIC TESTING: We discussed with Ms. Tango that her genetic testing was fairly extensive.  If there are genes identified to increase cancer risk that can be analyzed in the future, we would be happy to discuss and coordinate this  testing at that time.    CANCER SCREENING RECOMMENDATIONS: Ms. Jared test result is considered negative (normal).  This means that we have not identified a hereditary cause for her  personal and family history of cancer at this time. Most cancers happen by chance and this negative test suggests that her cancer may fall into this category.    While reassuring, this does not definitively rule out a hereditary predisposition to cancer. It is still possible that there could be genetic mutations that are undetectable by current technology. There could be genetic mutations in genes that have not been tested or identified to increase cancer risk.  Therefore, it is recommended she continue to follow the cancer management and screening guidelines  provided by her oncology and primary healthcare provider.   An individual's cancer risk and medical management are not determined by genetic test results alone. Overall cancer risk assessment incorporates additional factors, including personal medical history, family history, and any available genetic information that may result in a personalized plan for cancer prevention and surveillance.  RECOMMENDATIONS FOR FAMILY MEMBERS:  Relatives in this family might be at some increased risk of developing cancer, over the general population risk, simply due to the family history of cancer.  We recommended female relatives in this family have a yearly mammogram beginning at age 51, or 80 years younger than the earliest onset of cancer, an annual clinical breast exam, and perform monthly breast self-exams. Female relatives in this family should also have a gynecological exam as recommended by their primary provider. All family members should have a colonoscopy by age 22, or as directed by their physicians.   It is also possible there is a hereditary cause for the cancer in Ms. Orrego's family that she did not inherit and therefore was not identified in her.  Based on Ms. Dayal's family history, we recommended her maternal relatives have genetic counseling and testing. Ms. Mcgeehan will let us know if we can be of any assistance in coordinating genetic counseling and/or testing for these family members.  FOLLOW-UP: Lastly, we discussed with Ms. Longshore that cancer genetics is a rapidly advancing field and it is possible that new genetic tests will be appropriate for her and/or her family members in the future. We encouraged her to remain in contact with cancer genetics on an annual basis so we can update her personal and family histories and let her know of advances in cancer genetics that may benefit this family.   Our contact number was provided. Ms. Birchler questions were answered to her satisfaction, and she knows she  is welcome to call us at anytime with additional questions or concerns.   Faith Rogue, MS, [facility] Genetic Counselor [email or web address] Phone: [phone]

## 2019-02-28 NOTE — Telephone Encounter (Signed)
Revealed negative genetic testing.  Revealed that a VUS in BRCA2 and MUTYH was identified.  We discussed that we do not know why she has breast cancer or why there is cancer in the family. It could be due to a different gene that we are not testing, or something our current technology cannot pick up.  It will be important for her to keep in contact with genetics to learn if additional testing may be needed in the future.

## 2019-03-04 ENCOUNTER — Other Ambulatory Visit: Payer: Self-pay | Admitting: General Surgery

## 2019-03-04 DIAGNOSIS — C50211 Malignant neoplasm of upper-inner quadrant of right female breast: Secondary | ICD-10-CM

## 2019-03-04 DIAGNOSIS — Z170 Estrogen receptor positive status [ER+]: Secondary | ICD-10-CM

## 2019-03-05 ENCOUNTER — Other Ambulatory Visit: Payer: Medicare HMO

## 2019-03-06 ENCOUNTER — Other Ambulatory Visit: Payer: Self-pay | Admitting: *Deleted

## 2019-03-06 ENCOUNTER — Other Ambulatory Visit: Payer: Self-pay

## 2019-03-06 DIAGNOSIS — C50211 Malignant neoplasm of upper-inner quadrant of right female breast: Secondary | ICD-10-CM

## 2019-03-06 DIAGNOSIS — Z170 Estrogen receptor positive status [ER+]: Secondary | ICD-10-CM

## 2019-03-07 ENCOUNTER — Telehealth: Payer: Self-pay | Admitting: Hematology and Oncology

## 2019-03-07 ENCOUNTER — Encounter: Payer: Self-pay | Admitting: *Deleted

## 2019-03-07 NOTE — Telephone Encounter (Signed)
Scheduled per 1/7 sch msg. Called and spoke with pt, confirmed 1/28 appt

## 2019-03-11 ENCOUNTER — Encounter: Payer: Self-pay | Admitting: *Deleted

## 2019-03-12 ENCOUNTER — Other Ambulatory Visit: Payer: Self-pay

## 2019-03-12 ENCOUNTER — Encounter (HOSPITAL_BASED_OUTPATIENT_CLINIC_OR_DEPARTMENT_OTHER): Payer: Self-pay | Admitting: General Surgery

## 2019-03-13 ENCOUNTER — Ambulatory Visit (HOSPITAL_COMMUNITY)
Admission: RE | Admit: 2019-03-13 | Discharge: 2019-03-13 | Disposition: A | Discharge status: Home / Self Care | Payer: Medicare HMO | Source: Ambulatory Visit | Attending: Hematology and Oncology | Admitting: Hematology and Oncology

## 2019-03-13 DIAGNOSIS — I10 Essential (primary) hypertension: Secondary | ICD-10-CM | POA: Insufficient documentation

## 2019-03-13 DIAGNOSIS — I351 Nonrheumatic aortic (valve) insufficiency: Secondary | ICD-10-CM | POA: Insufficient documentation

## 2019-03-13 DIAGNOSIS — Z170 Estrogen receptor positive status [ER+]: Secondary | ICD-10-CM | POA: Diagnosis not present

## 2019-03-13 DIAGNOSIS — C50211 Malignant neoplasm of upper-inner quadrant of right female breast: Secondary | ICD-10-CM | POA: Insufficient documentation

## 2019-03-13 DIAGNOSIS — E785 Hyperlipidemia, unspecified: Secondary | ICD-10-CM | POA: Diagnosis not present

## 2019-03-13 DIAGNOSIS — Z01818 Encounter for other preprocedural examination: Secondary | ICD-10-CM | POA: Insufficient documentation

## 2019-03-13 DIAGNOSIS — Z0189 Encounter for other specified special examinations: Secondary | ICD-10-CM | POA: Diagnosis not present

## 2019-03-13 NOTE — Progress Notes (Signed)
  Echocardiogram 2D Echocardiogram has been performed.  Amber [name] 03/13/2019, 12:13 PM

## 2019-03-15 ENCOUNTER — Other Ambulatory Visit: Payer: Self-pay

## 2019-03-15 ENCOUNTER — Other Ambulatory Visit (HOSPITAL_COMMUNITY)
Admission: RE | Admit: 2019-03-15 | Discharge: 2019-03-15 | Disposition: A | Discharge status: Home / Self Care | Payer: Medicare HMO | Source: Ambulatory Visit | Attending: General Surgery | Admitting: General Surgery

## 2019-03-15 ENCOUNTER — Encounter (HOSPITAL_BASED_OUTPATIENT_CLINIC_OR_DEPARTMENT_OTHER)
Admission: RE | Admit: 2019-03-15 | Discharge: 2019-03-15 | Disposition: A | Discharge status: Home / Self Care | Payer: Medicare HMO | Source: Ambulatory Visit | Attending: General Surgery | Admitting: General Surgery

## 2019-03-15 DIAGNOSIS — C50911 Malignant neoplasm of unspecified site of right female breast: Secondary | ICD-10-CM | POA: Diagnosis not present

## 2019-03-15 DIAGNOSIS — Z01818 Encounter for other preprocedural examination: Secondary | ICD-10-CM | POA: Insufficient documentation

## 2019-03-15 DIAGNOSIS — Z20822 Contact with and (suspected) exposure to covid-19: Secondary | ICD-10-CM | POA: Insufficient documentation

## 2019-03-15 MED ORDER — ENSURE PRE-SURGERY PO LIQD: 296.0000 mL | Freq: Once | ORAL | Status: DC

## 2019-03-15 NOTE — Progress Notes (Signed)

## 2019-03-16 LAB — NOVEL CORONAVIRUS, NAA (HOSP ORDER, SEND-OUT TO REF LAB; TAT 18-24 HRS): SARS-CoV-2, NAA: NOT DETECTED

## 2019-03-19 ENCOUNTER — Other Ambulatory Visit: Payer: Self-pay

## 2019-03-19 ENCOUNTER — Observation Stay (HOSPITAL_BASED_OUTPATIENT_CLINIC_OR_DEPARTMENT_OTHER)
Admission: RE | Admit: 2019-03-19 | Discharge: 2019-03-20 | Disposition: A | Discharge status: Home / Self Care | Payer: Medicare HMO | Attending: General Surgery | Admitting: General Surgery

## 2019-03-19 ENCOUNTER — Encounter (HOSPITAL_BASED_OUTPATIENT_CLINIC_OR_DEPARTMENT_OTHER): Payer: Self-pay | Admitting: General Surgery

## 2019-03-19 ENCOUNTER — Ambulatory Visit (HOSPITAL_BASED_OUTPATIENT_CLINIC_OR_DEPARTMENT_OTHER): Payer: Medicare HMO | Admitting: Anesthesiology

## 2019-03-19 ENCOUNTER — Encounter (HOSPITAL_BASED_OUTPATIENT_CLINIC_OR_DEPARTMENT_OTHER)
Admission: RE | Disposition: A | Discharge status: Home / Self Care | Payer: Self-pay | Source: Home / Self Care | Attending: General Surgery

## 2019-03-19 ENCOUNTER — Ambulatory Visit (HOSPITAL_COMMUNITY)
Admission: RE | Admit: 2019-03-19 | Discharge: 2019-03-19 | Disposition: A | Discharge status: Home / Self Care | Payer: Medicare HMO | Source: Ambulatory Visit | Attending: General Surgery | Admitting: General Surgery

## 2019-03-19 DIAGNOSIS — Z7982 Long term (current) use of aspirin: Secondary | ICD-10-CM | POA: Diagnosis not present

## 2019-03-19 DIAGNOSIS — C50211 Malignant neoplasm of upper-inner quadrant of right female breast: Secondary | ICD-10-CM | POA: Diagnosis not present

## 2019-03-19 DIAGNOSIS — Z90710 Acquired absence of both cervix and uterus: Secondary | ICD-10-CM | POA: Insufficient documentation

## 2019-03-19 DIAGNOSIS — Z841 Family history of disorders of kidney and ureter: Secondary | ICD-10-CM | POA: Insufficient documentation

## 2019-03-19 DIAGNOSIS — Z79899 Other long term (current) drug therapy: Secondary | ICD-10-CM | POA: Insufficient documentation

## 2019-03-19 DIAGNOSIS — Z803 Family history of malignant neoplasm of breast: Secondary | ICD-10-CM | POA: Insufficient documentation

## 2019-03-19 DIAGNOSIS — I10 Essential (primary) hypertension: Secondary | ICD-10-CM | POA: Diagnosis not present

## 2019-03-19 DIAGNOSIS — Z888 Allergy status to other drugs, medicaments and biological substances status: Secondary | ICD-10-CM | POA: Diagnosis not present

## 2019-03-19 DIAGNOSIS — I2510 Atherosclerotic heart disease of native coronary artery without angina pectoris: Secondary | ICD-10-CM | POA: Diagnosis not present

## 2019-03-19 DIAGNOSIS — Z8052 Family history of malignant neoplasm of bladder: Secondary | ICD-10-CM | POA: Diagnosis not present

## 2019-03-19 DIAGNOSIS — C50911 Malignant neoplasm of unspecified site of right female breast: Secondary | ICD-10-CM | POA: Diagnosis not present

## 2019-03-19 DIAGNOSIS — Z170 Estrogen receptor positive status [ER+]: Secondary | ICD-10-CM

## 2019-03-19 DIAGNOSIS — Z811 Family history of alcohol abuse and dependence: Secondary | ICD-10-CM | POA: Insufficient documentation

## 2019-03-19 DIAGNOSIS — M1990 Unspecified osteoarthritis, unspecified site: Secondary | ICD-10-CM | POA: Diagnosis not present

## 2019-03-19 DIAGNOSIS — G8918 Other acute postprocedural pain: Secondary | ICD-10-CM | POA: Diagnosis not present

## 2019-03-19 DIAGNOSIS — R69 Illness, unspecified: Secondary | ICD-10-CM | POA: Diagnosis not present

## 2019-03-19 DIAGNOSIS — Z800 Family history of malignant neoplasm of digestive organs: Secondary | ICD-10-CM | POA: Insufficient documentation

## 2019-03-19 DIAGNOSIS — E7800 Pure hypercholesterolemia, unspecified: Secondary | ICD-10-CM | POA: Diagnosis not present

## 2019-03-19 DIAGNOSIS — M549 Dorsalgia, unspecified: Secondary | ICD-10-CM | POA: Insufficient documentation

## 2019-03-19 DIAGNOSIS — E785 Hyperlipidemia, unspecified: Secondary | ICD-10-CM | POA: Diagnosis not present

## 2019-03-19 HISTORY — PX: MASTECTOMY W/ SENTINEL NODE BIOPSY: SHX2001

## 2019-03-19 SURGERY — MASTECTOMY WITH SENTINEL LYMPH NODE BIOPSY
Anesthesia: General | Site: Breast | Laterality: Right

## 2019-03-19 MED ORDER — CEFAZOLIN SODIUM-DEXTROSE 2-3 GM-%(50ML) IV SOLR
INTRAVENOUS | Status: DC | PRN
  Administered 2019-03-19: 2 g via INTRAVENOUS

## 2019-03-19 MED ORDER — ONDANSETRON HCL 4 MG/2ML IJ SOLN: 4.0000 mg | Freq: Four times a day (QID) | INTRAMUSCULAR | Status: DC | PRN

## 2019-03-19 MED ORDER — MIDAZOLAM HCL 2 MG/2ML IJ SOLN: 1.0000 mg | INTRAMUSCULAR | Status: DC | PRN

## 2019-03-19 MED ORDER — AMLODIPINE BESYLATE 10 MG PO TABS
10.0000 mg | ORAL_TABLET | Freq: Every day | ORAL | Status: DC
  Administered 2019-03-19: 06:00:00 10 mg via ORAL

## 2019-03-19 MED ORDER — FENTANYL CITRATE (PF) 100 MCG/2ML IJ SOLN
INTRAMUSCULAR | Status: AC
  Filled 2019-03-19: qty 2

## 2019-03-19 MED ORDER — METHOCARBAMOL 500 MG PO TABS
500.0000 mg | ORAL_TABLET | Freq: Three times a day (TID) | ORAL | Status: DC
  Administered 2019-03-19 (×2): 500 mg via ORAL
  Filled 2019-03-19 (×2): qty 1

## 2019-03-19 MED ORDER — ACETAMINOPHEN 325 MG PO TABS: 325.0000 mg | ORAL_TABLET | ORAL | Status: DC | PRN

## 2019-03-19 MED ORDER — MEPERIDINE HCL 25 MG/ML IJ SOLN: 6.2500 mg | INTRAMUSCULAR | Status: DC | PRN

## 2019-03-19 MED ORDER — METHYLENE BLUE 0.5 % INJ SOLN
INTRAVENOUS | Status: AC
  Filled 2019-03-19: qty 10

## 2019-03-19 MED ORDER — PROPOFOL 10 MG/ML IV BOLUS
INTRAVENOUS | Status: AC
  Filled 2019-03-19: qty 20

## 2019-03-19 MED ORDER — OXYCODONE HCL 5 MG PO TABS: 5.0000 mg | ORAL_TABLET | Freq: Four times a day (QID) | ORAL | 0 refills | Status: DC | PRN

## 2019-03-19 MED ORDER — ACETAMINOPHEN 500 MG PO TABS
ORAL_TABLET | ORAL | Status: AC
  Filled 2019-03-19: qty 2

## 2019-03-19 MED ORDER — OXYCODONE HCL 5 MG PO TABS: 5.0000 mg | ORAL_TABLET | Freq: Once | ORAL | Status: DC | PRN

## 2019-03-19 MED ORDER — ROPIVACAINE HCL 5 MG/ML IJ SOLN
INTRAMUSCULAR | Status: DC | PRN
  Administered 2019-03-19: 30 mL via EPIDURAL

## 2019-03-19 MED ORDER — PHENYLEPHRINE HCL-NACL 10-0.9 MG/250ML-% IV SOLN
INTRAVENOUS | Status: DC | PRN
  Administered 2019-03-19: 40 ug/min via INTRAVENOUS

## 2019-03-19 MED ORDER — TECHNETIUM TC 99M SULFUR COLLOID FILTERED
1.0000 | Freq: Once | INTRAVENOUS | Status: AC | PRN
  Administered 2019-03-19: 09:00:00 1 via INTRADERMAL

## 2019-03-19 MED ORDER — ONDANSETRON HCL 4 MG/2ML IJ SOLN: 4.0000 mg | Freq: Once | INTRAMUSCULAR | Status: DC | PRN

## 2019-03-19 MED ORDER — FENTANYL CITRATE (PF) 100 MCG/2ML IJ SOLN
INTRAMUSCULAR | Status: DC | PRN
  Administered 2019-03-19: 50 ug via INTRAVENOUS

## 2019-03-19 MED ORDER — MIDAZOLAM HCL 2 MG/2ML IJ SOLN
INTRAMUSCULAR | Status: AC
  Filled 2019-03-19: qty 2

## 2019-03-19 MED ORDER — PROPOFOL 10 MG/ML IV BOLUS
INTRAVENOUS | Status: DC | PRN
  Administered 2019-03-19: 10 mg via INTRAVENOUS
  Administered 2019-03-19: 120 mg via INTRAVENOUS

## 2019-03-19 MED ORDER — LIDOCAINE 2% (20 MG/ML) 5 ML SYRINGE
INTRAMUSCULAR | Status: AC
  Filled 2019-03-19: qty 5

## 2019-03-19 MED ORDER — EPHEDRINE SULFATE 50 MG/ML IJ SOLN
INTRAMUSCULAR | Status: DC | PRN
  Administered 2019-03-19: 15 mg via INTRAVENOUS
  Administered 2019-03-19: 10 mg via INTRAVENOUS

## 2019-03-19 MED ORDER — ONDANSETRON 4 MG PO TBDP: 4.0000 mg | ORAL_TABLET | Freq: Four times a day (QID) | ORAL | Status: DC | PRN

## 2019-03-19 MED ORDER — DEXAMETHASONE SODIUM PHOSPHATE 10 MG/ML IJ SOLN
INTRAMUSCULAR | Status: DC | PRN
  Administered 2019-03-19: 10 mg via INTRAVENOUS

## 2019-03-19 MED ORDER — CEFAZOLIN SODIUM-DEXTROSE 2-4 GM/100ML-% IV SOLN
2.0000 g | INTRAVENOUS | Status: AC
  Administered 2019-03-19: 09:00:00 2 g via INTRAVENOUS

## 2019-03-19 MED ORDER — ONDANSETRON HCL 4 MG/2ML IJ SOLN
INTRAMUSCULAR | Status: AC
  Filled 2019-03-19: qty 2

## 2019-03-19 MED ORDER — ACETAMINOPHEN 500 MG PO TABS
1000.0000 mg | ORAL_TABLET | ORAL | Status: AC
  Administered 2019-03-19: 1000 mg via ORAL

## 2019-03-19 MED ORDER — FENTANYL CITRATE (PF) 100 MCG/2ML IJ SOLN
50.0000 ug | INTRAMUSCULAR | Status: DC | PRN
  Administered 2019-03-19: 08:00:00 100 ug via INTRAVENOUS

## 2019-03-19 MED ORDER — MORPHINE SULFATE (PF) 4 MG/ML IV SOLN: 1.0000 mg | INTRAVENOUS | Status: DC | PRN

## 2019-03-19 MED ORDER — ACETAMINOPHEN 500 MG PO TABS
1000.0000 mg | ORAL_TABLET | Freq: Four times a day (QID) | ORAL | Status: DC
  Administered 2019-03-19 – 2019-03-20 (×3): 1000 mg via ORAL
  Filled 2019-03-19 (×3): qty 2

## 2019-03-19 MED ORDER — 0.9 % SODIUM CHLORIDE (POUR BTL) OPTIME
TOPICAL | Status: DC | PRN
  Administered 2019-03-19: 1000 mL

## 2019-03-19 MED ORDER — GABAPENTIN 100 MG PO CAPS
100.0000 mg | ORAL_CAPSULE | ORAL | Status: AC
  Administered 2019-03-19: 08:00:00 100 mg via ORAL

## 2019-03-19 MED ORDER — SODIUM CHLORIDE (PF) 0.9 % IJ SOLN
INTRAMUSCULAR | Status: AC
  Filled 2019-03-19: qty 10

## 2019-03-19 MED ORDER — LACTATED RINGERS IV SOLN: INTRAVENOUS | Status: DC | PRN

## 2019-03-19 MED ORDER — GABAPENTIN 100 MG PO CAPS
ORAL_CAPSULE | ORAL | Status: AC
  Filled 2019-03-19: qty 1

## 2019-03-19 MED ORDER — SODIUM CHLORIDE 0.9 % IV SOLN: INTRAVENOUS | Status: DC

## 2019-03-19 MED ORDER — FENTANYL CITRATE (PF) 100 MCG/2ML IJ SOLN: 25.0000 ug | INTRAMUSCULAR | Status: DC | PRN

## 2019-03-19 MED ORDER — OXYCODONE HCL 5 MG PO TABS
5.0000 mg | ORAL_TABLET | ORAL | Status: DC | PRN
  Administered 2019-03-19: 5 mg via ORAL
  Filled 2019-03-19: qty 1

## 2019-03-19 MED ORDER — OXYCODONE HCL 5 MG/5ML PO SOLN: 5.0000 mg | Freq: Once | ORAL | Status: DC | PRN

## 2019-03-19 MED ORDER — PHENYLEPHRINE HCL-NACL 10-0.9 MG/250ML-% IV SOLN: INTRAVENOUS | Status: DC | PRN

## 2019-03-19 MED ORDER — SIMETHICONE 80 MG PO CHEW: 40.0000 mg | CHEWABLE_TABLET | Freq: Four times a day (QID) | ORAL | Status: DC | PRN

## 2019-03-19 MED ORDER — ACETAMINOPHEN 160 MG/5ML PO SOLN: 325.0000 mg | ORAL | Status: DC | PRN

## 2019-03-19 MED ORDER — LACTATED RINGERS IV SOLN: INTRAVENOUS | Status: DC

## 2019-03-19 MED ORDER — ROCURONIUM BROMIDE 10 MG/ML (PF) SYRINGE
PREFILLED_SYRINGE | INTRAVENOUS | Status: AC
  Filled 2019-03-19: qty 10

## 2019-03-19 MED ORDER — METHOCARBAMOL 500 MG PO TABS: 500.0000 mg | ORAL_TABLET | Freq: Three times a day (TID) | ORAL | 0 refills | Status: DC | PRN

## 2019-03-19 MED ORDER — DEXAMETHASONE SODIUM PHOSPHATE 10 MG/ML IJ SOLN
INTRAMUSCULAR | Status: AC
  Filled 2019-03-19: qty 1

## 2019-03-19 MED ORDER — CEFAZOLIN SODIUM-DEXTROSE 2-4 GM/100ML-% IV SOLN
INTRAVENOUS | Status: AC
  Filled 2019-03-19: qty 100

## 2019-03-19 SURGICAL SUPPLY — 71 items
APPLIER CLIP 11 MED OPEN (CLIP)
APPLIER CLIP 9.375 MED OPEN (MISCELLANEOUS)
BENZOIN TINCTURE PRP APPL 2/3 (GAUZE/BANDAGES/DRESSINGS) IMPLANT
BINDER BREAST LRG (GAUZE/BANDAGES/DRESSINGS) ×1 IMPLANT
BINDER BREAST MEDIUM (GAUZE/BANDAGES/DRESSINGS) IMPLANT
BINDER BREAST XLRG (GAUZE/BANDAGES/DRESSINGS) IMPLANT
BINDER BREAST XXLRG (GAUZE/BANDAGES/DRESSINGS) IMPLANT
BIOPATCH RED 1 DISK 7.0 (GAUZE/BANDAGES/DRESSINGS) IMPLANT
BLADE CLIPPER SURG (BLADE) IMPLANT
BLADE HEX COATED 2.75 (ELECTRODE) IMPLANT
BLADE SURG 10 STRL SS (BLADE) ×2 IMPLANT
BLADE SURG 15 STRL LF DISP TIS (BLADE) ×1 IMPLANT
BLADE SURG 15 STRL SS (BLADE) ×1
CANISTER SUCT 1200ML W/VALVE (MISCELLANEOUS) ×2 IMPLANT
CHLORAPREP W/TINT 26 (MISCELLANEOUS) ×2 IMPLANT
CLIP APPLIE 11 MED OPEN (CLIP) IMPLANT
CLIP APPLIE 9.375 MED OPEN (MISCELLANEOUS) IMPLANT
CLIP VESOCCLUDE SM WIDE 6/CT (CLIP) IMPLANT
COVER BACK TABLE 60X90IN (DRAPES) ×2 IMPLANT
COVER MAYO STAND STRL (DRAPES) ×2 IMPLANT
COVER PROBE W GEL 5X96 (DRAPES) ×2 IMPLANT
COVER WAND RF STERILE (DRAPES) IMPLANT
DECANTER SPIKE VIAL GLASS SM (MISCELLANEOUS) IMPLANT
DERMABOND ADVANCED (GAUZE/BANDAGES/DRESSINGS) ×1
DERMABOND ADVANCED .7 DNX12 (GAUZE/BANDAGES/DRESSINGS) ×1 IMPLANT
DRAIN CHANNEL 19F RND (DRAIN) ×2 IMPLANT
DRAPE HALF SHEET 70X43 (DRAPES) IMPLANT
DRAPE TOP ARMCOVERS (MISCELLANEOUS) ×2 IMPLANT
DRAPE U-SHAPE 76X120 STRL (DRAPES) ×2 IMPLANT
DRAPE UTILITY XL STRL (DRAPES) ×2 IMPLANT
DRSG PAD ABDOMINAL 8X10 ST (GAUZE/BANDAGES/DRESSINGS) ×2 IMPLANT
DRSG TEGADERM 4X4.75 (GAUZE/BANDAGES/DRESSINGS) ×1 IMPLANT
ELECT BLADE 4.0 EZ CLEAN MEGAD (MISCELLANEOUS)
ELECT REM PT RETURN 9FT ADLT (ELECTROSURGICAL) ×2
ELECTRODE BLDE 4.0 EZ CLN MEGD (MISCELLANEOUS) IMPLANT
ELECTRODE REM PT RTRN 9FT ADLT (ELECTROSURGICAL) ×1 IMPLANT
EVACUATOR SILICONE 100CC (DRAIN) ×2 IMPLANT
GAUZE SPONGE 4X4 12PLY STRL LF (GAUZE/BANDAGES/DRESSINGS) IMPLANT
GLOVE BIO SURGEON STRL SZ7 (GLOVE) ×2 IMPLANT
GLOVE BIOGEL PI IND STRL 7.5 (GLOVE) ×1 IMPLANT
GLOVE BIOGEL PI INDICATOR 7.5 (GLOVE) ×1
GOWN STRL REUS W/ TWL LRG LVL3 (GOWN DISPOSABLE) ×3 IMPLANT
GOWN STRL REUS W/TWL LRG LVL3 (GOWN DISPOSABLE) ×3
HEMOSTAT ARISTA ABSORB 3G PWDR (HEMOSTASIS) IMPLANT
NDL HYPO 25X1 1.5 SAFETY (NEEDLE) IMPLANT
NDL SAFETY ECLIPSE 18X1.5 (NEEDLE) IMPLANT
NEEDLE HYPO 18GX1.5 SHARP (NEEDLE)
NEEDLE HYPO 25X1 1.5 SAFETY (NEEDLE) IMPLANT
NS IRRIG 1000ML POUR BTL (IV SOLUTION) ×2 IMPLANT
PACK BASIN DAY SURGERY FS (CUSTOM PROCEDURE TRAY) ×2 IMPLANT
PENCIL SMOKE EVACUATOR (MISCELLANEOUS) ×3 IMPLANT
PIN SAFETY STERILE (MISCELLANEOUS) ×2 IMPLANT
RETRACTOR ONETRAX LX 90X20 (MISCELLANEOUS) IMPLANT
SLEEVE SCD COMPRESS KNEE MED (MISCELLANEOUS) ×2 IMPLANT
SPONGE LAP 18X18 RF (DISPOSABLE) ×2 IMPLANT
SPONGE LAP 4X18 RFD (DISPOSABLE) IMPLANT
STRIP CLOSURE SKIN 1/2X4 (GAUZE/BANDAGES/DRESSINGS) IMPLANT
SUT ETHILON 2 0 FS 18 (SUTURE) ×2 IMPLANT
SUT MNCRL AB 4-0 PS2 18 (SUTURE) IMPLANT
SUT SILK 2 0 SH (SUTURE) IMPLANT
SUT VIC AB 2-0 SH 27 (SUTURE) ×2
SUT VIC AB 2-0 SH 27XBRD (SUTURE) ×2 IMPLANT
SUT VIC AB 3-0 54X BRD REEL (SUTURE) IMPLANT
SUT VIC AB 3-0 BRD 54 (SUTURE)
SUT VIC AB 3-0 SH 27 (SUTURE)
SUT VIC AB 3-0 SH 27X BRD (SUTURE) IMPLANT
SUT VICRYL 3-0 CR8 SH (SUTURE) ×2 IMPLANT
SYR CONTROL 10ML LL (SYRINGE) IMPLANT
TOWEL GREEN STERILE FF (TOWEL DISPOSABLE) ×4 IMPLANT
TUBE CONNECTING 20X1/4 (TUBING) ×2 IMPLANT
YANKAUER SUCT BULB TIP NO VENT (SUCTIONS) ×2 IMPLANT

## 2019-03-19 NOTE — Progress Notes (Signed)
Pre procedure/ block vitals.

## 2019-03-19 NOTE — Anesthesia Procedure Notes (Signed)
Anesthesia Regional Block: Pectoralis block   Pre-Anesthetic Checklist: ,, timeout performed, Correct Patient, Correct Site, Correct Laterality, Correct Procedure, Correct Position, site marked, Risks and benefits discussed,  Surgical consent,  Pre-op evaluation,  At surgeon's request and post-op pain management  Laterality: Right  Prep: chloraprep       Needles:  Injection technique: Single-shot  Needle Type: Echogenic Stimulator Needle     Needle Length: 5cm  Needle Gauge: 22     Additional Needles:   Procedures:, nerve stimulator,,, ultrasound used (permanent image in chart),,,,  Narrative:  Start time: 03/19/2019 8:30 AM End time: 03/19/2019 8:35 AM Injection made incrementally with aspirations every 5 mL.  Performed by: Personally  Anesthesiologist: Janeece Riggers, MD  Additional Notes: Functioning IV was confirmed and monitors were applied.  A 52mm 22ga Arrow echogenic stimulator needle was used. Sterile prep and drape,hand hygiene and sterile gloves were used. Ultrasound guidance: relevant anatomy identified, needle position confirmed, local anesthetic spread visualized around nerve(s)., vascular puncture avoided.  Image printed for medical record. Negative aspiration and negative test dose prior to incremental administration of local anesthetic. The patient tolerated the procedure well.

## 2019-03-19 NOTE — Op Note (Signed)
Preoperative diagnosis: Right breast invasive lobular cancer, clinical stage II Postoperative diagnosis: Same as above Procedure: 1.  Right mastectomy 2.  Right deep axillary sentinel lymph node biopsy Surgeon: Dr. Serita Grammes Asst: Judyann Munson RNFA Anesthesia: General with pectoral block Estimated blood loss: 50 cc Specimens: Right mastectomy marked short superior, long lateral and axillary sentinel node contained in the specimen Complications: None Drains: 19 French Blake drain Sponge needle count was correct at completion Disposition to recovery stable condition  Indications: This is a 86 year old female I know for a right colectomy for colon cancer in 2010 who underwent a screening mammogram that showed a 2.3 cm right breast upper inner quadrant mass.  This underwent biopsy and is a grade 2 invasive lobular cancer that is triple positive.  She is scheduled to get subcutaneous anti-HER-2 therapy.  She underwent an MRI that showed a couple of other lesions on the right side.  Due to her desire not to have radiotherapy she elected to undergo mastectomy with sentinel lymph node biopsy.  She declined reconstruction.  Procedure: After informed consent was obtained the patient was taken to the operating room.  She had undergone a pectoral block and was injected did with technetium in the standard periareolar fashion.  She was then placed under general anesthesia without complication.  She was prepped and draped in the standard sterile surgical fashion.  A surgical timeout was then performed.  I made an elliptical incision to accomplish the nipple and areola.  I then created flaps to the parasternal area, inframammary fold, clavicle, latissimus laterally.  I then remove the breast as well as the fascia from the pectoralis muscle.  The sentinel node was low-lying.  I remove this with the breast.  The highest count was 319.  There was no background radioactivity and no palpable lymph nodes  present.  I then marked the breast as above.  I then obtained hemostasis.  I closed the lateral portion down with 2-0 Vicryl sutures.  I then closed the incision after placing a 19 [place] Blake drain with 3-0 Vicryl sutures.  4-0 Monocryl and glue were used to close the skin.  She tolerated this well was extubated and transferred to the recovery room in stable condition.

## 2019-03-19 NOTE — Anesthesia Postprocedure Evaluation (Signed)
Anesthesia Post Note  Patient: Amber [name]  Procedure(s) Performed: RIGHT MASTECTOMY WITH RIGHT AXILLARY SENTINEL LYMPH NODE BIOPSY (Right Breast)     Patient location during evaluation: PACU Anesthesia Type: General Level of consciousness: awake and alert and oriented Pain management: pain level controlled Vital Signs Assessment: post-procedure vital signs reviewed and stable Respiratory status: spontaneous breathing, nonlabored ventilation and respiratory function stable Cardiovascular status: blood pressure returned to baseline Postop Assessment: no apparent nausea or vomiting Anesthetic complications: no    Last Vitals:  Vitals:   03/19/19 1045 03/19/19 1100  BP: 114/71 121/76  Pulse: 86 98  Resp: 14 16  Temp: (!) 36.4 C   SpO2: 100% 95%    Last Pain:  Vitals:   03/19/19 1100  TempSrc:   PainSc: 0-No pain                 Brennan Bailey

## 2019-03-19 NOTE — Progress Notes (Signed)
Patient given emotional support during Sentinel node injections.

## 2019-03-19 NOTE — Progress Notes (Signed)
Assisted Dr. Oddono with right, ultrasound guided, pectoralis block. Side rails up, monitors on throughout procedure. See vital signs in flow sheet. Tolerated Procedure well. 

## 2019-03-19 NOTE — Anesthesia Preprocedure Evaluation (Signed)
Anesthesia Evaluation  Patient identified by MRN, date of birth, ID band Patient awake    Reviewed: Allergy & Precautions, H&P , NPO status , Patient's Chart, lab work & pertinent test results, reviewed documented beta blocker date and time   Airway Mallampati: II  TM Distance: >3 FB Neck ROM: full    Dental no notable dental hx.    Pulmonary neg pulmonary ROS,    Pulmonary exam normal breath sounds clear to auscultation       Cardiovascular Exercise Tolerance: Good hypertension, Pt. on medications + CAD  negative cardio ROS   Rhythm:regular Rate:Normal  ECHO 21'   1. Left ventricular ejection fraction, by visual estimation, is 60 to 65%. The left ventricle has normal function. There is mildly increased left ventricular hypertrophy.  2. Left ventricular diastolic parameters are consistent with Grade I diastolic dysfunction (impaired relaxation).   Neuro/Psych negative neurological ROS  negative psych ROS   GI/Hepatic negative GI ROS, Neg liver ROS,   Endo/Other  negative endocrine ROS  Renal/GU negative Renal ROS  negative genitourinary   Musculoskeletal   Abdominal   Peds  Hematology  (+) Blood dyscrasia, anemia ,   Anesthesia Other Findings   Reproductive/Obstetrics negative OB ROS                             Anesthesia Physical Anesthesia Plan  ASA: II  Anesthesia Plan: General   Post-op Pain Management: GA combined w/ Regional for post-op pain   Induction: Intravenous  PONV Risk Score and Plan: 3 and Ondansetron, Treatment may vary due to age or medical condition, Dexamethasone and Midazolam  Airway Management Planned: LMA and Oral ETT  Additional Equipment:   Intra-op Plan:   Post-operative Plan: Extubation in OR  Informed Consent: I have reviewed the patients History and Physical, chart, labs and discussed the procedure including the risks, benefits and  alternatives for the proposed anesthesia with the patient or authorized representative who has indicated his/her understanding and acceptance.     Dental Advisory Given  Plan Discussed with: CRNA, Anesthesiologist and Surgeon  Anesthesia Plan Comments: ( )        Anesthesia Quick Evaluation

## 2019-03-19 NOTE — Progress Notes (Signed)
Post injection vitals.

## 2019-03-19 NOTE — Transfer of Care (Signed)
Immediate Anesthesia Transfer of Care Note  Patient: Amber [name]  Procedure(s) Performed: RIGHT MASTECTOMY WITH RIGHT AXILLARY SENTINEL LYMPH NODE BIOPSY (Right Breast)  Patient Location: PACU  Anesthesia Type:General  Level of Consciousness: awake, alert  and oriented  Airway & Oxygen Therapy: Patient Spontanous Breathing and Patient connected to face mask oxygen  Post-op Assessment: Report given to RN and Post -op Vital signs reviewed and stable  Post vital signs: Reviewed and stable  Last Vitals:  Vitals Value Taken Time  BP 91/78 03/19/19 1030  Temp    Pulse 79 03/19/19 1031  Resp 14 03/19/19 1031  SpO2 96 % 03/19/19 1031  Vitals shown include unvalidated device data.  Last Pain:  Vitals:   03/19/19 0801  TempSrc: Oral  PainSc: 0-No pain      Patients Stated Pain Goal: 5 ([id number])  Complications: No apparent anesthesia complications

## 2019-03-19 NOTE — H&P (Signed)
86 yof referred by Dr Damita Dunnings for new right breast cancer. I did a right colectomy on her in 2010 for T3N0 colon from which she is doing well. she had screening mm that shows b density breasts. no mass or dc. she has a 2.3 cm right breast upper inner quadrant mass on [place]. [place] axilla negative. biopsy is grade II ILC that is er/pr pos, her 2 pending, Ki is 15%. she is here to discuss options she has fh of breast cancer in her mom at age 84, personal history of colon cancer, bladder cancer in her brother and dad with colon cancer. she has not had genetic testing .   Past Surgical History  Colon Removal - Partial  Hemorrhoidectomy  Hysterectomy (due to cancer) - Complete   Diagnostic Studies History  Colonoscopy  >10 years ago Mammogram  within last year  Allergies  Escitalopram Oxalate *ANTIDEPRESSANTS*  Lipitor *ANTIHYPERLIPIDEMICS*  Zetia *ANTIHYPERLIPIDEMICS*  Zocor *ANTIHYPERLIPIDEMICS*  Allergies Reconciled   Medication History amLODIPine Besylate (10MG  Tablet, Oral) Active. Rosuvastatin Calcium (5MG  Tablet, Oral) Active. Multi-Vitamin (Oral) Active. Aspirin (81MG  Tablet, Oral) Active. Medications Reconciled  Social History  Caffeine use  Carbonated beverages, Coffee, Tea. No alcohol use  No drug use  Tobacco use  Never smoker.  Family History Alcohol Abuse  Brother. Breast Cancer  Mother. Cancer  Brother. Colon Cancer  Father. Kidney Disease  Brother.  Pregnancy / Birth History  Age at menarche  72 years. Age of menopause  [age] Para  1  Other Problems  Arthritis  Back Pain  Breast Cancer  Colon Cancer  High blood pressure  Hypercholesterolemia  Lump In Breast  Transfusion history    Review of Systems  General Not Present- Appetite Loss, Chills, Fatigue, Fever, Night Sweats, Weight Gain and Weight Loss. Skin Not Present- Change in Wart/Mole, Dryness, Hives, Jaundice, New Lesions, Non-Healing Wounds, Rash and  Ulcer. HEENT Present- Wears glasses/contact lenses. Not Present- Earache, Hearing Loss, Hoarseness, Nose Bleed, Oral Ulcers, Ringing in the Ears, Seasonal Allergies, Sinus Pain, Sore Throat, Visual Disturbances and Yellow Eyes. Breast Present- Breast Mass. Not Present- Breast Pain, Nipple Discharge and Skin Changes. Cardiovascular Not Present- Chest Pain, Difficulty Breathing Lying Down, Leg Cramps, Palpitations, Rapid Heart Rate, Shortness of Breath and Swelling of Extremities. Gastrointestinal Not Present- Abdominal Pain, Bloating, Bloody Stool, Change in Bowel Habits, Chronic diarrhea, Constipation, Difficulty Swallowing, Excessive gas, Gets full quickly at meals, Hemorrhoids, Indigestion, Nausea, Rectal Pain and Vomiting. Female Genitourinary Not Present- Frequency, Nocturia, Painful Urination, Pelvic Pain and Urgency. Musculoskeletal Present- Back Pain and Muscle Weakness. Not Present- Joint Pain, Joint Stiffness, Muscle Pain and Swelling of Extremities. Neurological Present- Tingling and Weakness. Not Present- Decreased Memory, Fainting, Headaches, Numbness, Seizures, Tremor and Trouble walking. Psychiatric Not Present- Anxiety, Bipolar, Change in Sleep Pattern, Depression, Fearful and Frequent crying. Hematology Present- Easy Bruising. Not Present- Blood Thinners, Excessive bleeding, Gland problems, HIV and Persistent Infections. Arm, Standard)   Physical Exam  General Mental Status-Alert. Orientation-Oriented X3. Head and Neck Trachea-midline. Eye Sclera/Conjunctiva - Bilateral-No scleral icterus. Chest and Lung Exam Chest and lung exam reveals -quiet, even and easy respiratory effort with no use of accessory muscles. Breast Nipples-No Discharge. Breast Lump-No Palpable Breast Mass. Note: large right breast upper inner hematoma Abdomen Note: soft nt/no hepatomegaly, well healed incisoin Neurologic Neurologic evaluation reveals -alert and oriented x 3 with no  impairment of recent or remote memory. Lymphatic Head & Neck General Head & Neck Lymphatics: Bilateral - Description - Normal. Axillary General Axillary Region:  Bilateral - Description - Normal. Note: no [place] adenopathy   Assessment & Plan BREAST CANCER OF UPPER-INNER QUADRANT OF RIGHT FEMALE BREAST (C50.211) Elected to undergo right mastectomy mri with other lesions she declined biopsy, left side fine, does not want to do radiotherapy if indicated. Plan for right sm without recon and sn biopsy We discussed the staging and pathophysiology of breast cancer. We discussed all of the different options for treatment for breast cancer including surgery, chemotherapy, radiation therapy, Herceptin, and antiestrogen therapy. We discussed a sentinel lymph node biopsy as she does not appear to having lymph node involvement right now. We discussed the performance of that with injection of radioactive tracer. We discussed that there is a chance of having a positive node with a sentinel lymph node biopsy and we will await the permanent pathology to make any other first further decisions in terms of her treatment. We discussed up to a 5% risk lifetime of chronic shoulder pain as well as lymphedema associated with a sentinel lymph node biopsy. We discussed the options for treatment of the breast cancer which included lumpectomy versus a mastectomy. We discussed the performance of the lumpectomy with radioactive seed placement. We discussed a 5-10% chance of a positive margin requiring reexcision in the operating room. We also discussed that she will likely need radiation therapy if she undergoes lumpectomy. The breast cannot undergo more radiation therapy in the same breast after lumpectomy in the future. We discussed mastectomy and the postoperative care for that as well. Mastectomy can be followed by reconstruction. The decision for lumpectomy vs mastectomy has no impact on decision for chemotherapy. Most mastectomy  patients will not need radiation therapy. We discussed that there is no difference in her survival whether she undergoes lumpectomy with radiation therapy or antiestrogen therapy versus a mastectomy. There is also no real difference between her recurrence in the breast.

## 2019-03-19 NOTE — Progress Notes (Signed)
Pre injections given

## 2019-03-19 NOTE — Interval H&P Note (Signed)
History and Physical Interval Note:  03/19/2019 8:42 AM  Amber [name]  has presented today for surgery, with the diagnosis of RIGHT BREAST CANCER.  The various methods of treatment have been discussed with the patient and family. After consideration of risks, benefits and other options for treatment, the patient has consented to  Procedure(s): RIGHT MASTECTOMY WITH RIGHT AXILLARY SENTINEL LYMPH NODE BIOPSY (Right) as a surgical intervention.  The patient's history has been reviewed, patient examined, no change in status, stable for surgery.  I have reviewed the patient's chart and labs.  Questions were answered to the patient's satisfaction.     Rolm Bookbinder

## 2019-03-20 ENCOUNTER — Encounter: Payer: Self-pay | Admitting: *Deleted

## 2019-03-20 NOTE — Discharge Instructions (Signed)
[facility], [place] [phone]  MASTECTOMY: POST OP INSTRUCTIONS Take 400 mg of ibuprofen every 8 hours or 650 mg tylenol every 6 hours for next 72 hours then as needed. Use ice several times daily also. Always review your discharge instruction sheet given to you by the facility where your surgery was performed. IF YOU HAVE DISABILITY OR FAMILY LEAVE FORMS, YOU MUST BRING THEM TO THE OFFICE FOR PROCESSING.   DO NOT GIVE THEM TO YOUR DOCTOR. A prescription for pain medication may be given to you upon discharge.  Take your pain medication as prescribed, if needed.  If narcotic pain medicine is not needed, then you may take acetaminophen (Tylenol), naprosyn (Alleve) or ibuprofen (Advil) as needed. 1. Take your usually prescribed medications unless otherwise directed. 2. If you need a refill on your pain medication, please contact your pharmacy.  They will contact our office to request authorization.  Prescriptions will not be filled after 5pm or on week-ends. 3. You should follow a light diet the first few days after arrival home, such as soup and crackers, etc.  Resume your normal diet the day after surgery. 4. Most patients will experience some swelling and bruising on the chest and underarm.  Ice packs will help.  Swelling and bruising can take several days to resolve. Wear the binder day and night until you return to the office.  5. It is common to experience some constipation if taking pain medication after surgery.  Increasing fluid intake and taking a stool softener (such as Colace) will usually help or prevent this problem from occurring.  A mild laxative (Milk of Magnesia or Miralax) should be taken according to package instructions if there are no bowel movements after 48 hours. 6. Unless discharge instructions indicate otherwise, leave your bandage dry and in place until your next appointment in 3-5 days.  You may take a limited sponge bath.  No tube baths or showers until the  drains are removed.  You may have steri-strips (small skin tapes) in place directly over the incision.  These strips should be left on the skin for 7-10 days. If you have glue it will come off in next couple week.  Any sutures will be removed at an office visit 7. DRAINS:  If you have drains in place, it is important to keep a list of the amount of drainage produced each day in your drains.  Before leaving the hospital, you should be instructed on drain care.  Call our office if you have any questions about your drains. I will remove your drains when they put out less than 30 cc or ml for 2 consecutive days. 8. ACTIVITIES:  You may resume regular (light) daily activities beginning the next day--such as daily self-care, walking, climbing stairs--gradually increasing activities as tolerated.  You may have sexual intercourse when it is comfortable.  Refrain from any heavy lifting or straining until approved by your doctor. a. You may drive when you are no longer taking prescription pain medication, you can comfortably wear a seatbelt, and you can safely maneuver your car and apply brakes. b. RETURN TO WORK:  __________________________________________________________ 9. You should see your doctor in the office for a follow-up appointment approximately 3-5 days after your surgery.  Your doctor's nurse will typically make your follow-up appointment when she calls you with your pathology report.  Expect your pathology report 3-4business days after surgery. 10. OTHER INSTRUCTIONS: ______________________________________________________________________________________________ ____________________________________________________________________________________________ WHEN TO CALL YOUR DR [place]: 1. Fever over 101.0 2.  Nausea and/or vomiting 3. Extreme swelling or bruising 4. Continued bleeding from incision. 5. Increased pain, redness, or drainage from the incision. The clinic staff is available to answer your  questions during regular business hours.  Please don't hesitate to call and ask to speak to one of the nurses for clinical concerns.  If you have a medical emergency, go to the nearest emergency room or call 911.  A surgeon from [facility] is always on call at the hospital. [address] [place], [place], [place]  [address] ? [address], [place], [place]   [address] [phone] ? [phone] ? FAX [phone]  Web site: [email or web address]    Post Anesthesia Home Care Instructions  Activity: Get plenty of rest for the remainder of the day. A responsible individual must stay with you for 24 hours following the procedure.  For the next 24 hours, DO NOT: -Drive a car -Paediatric nurse -Drink alcoholic beverages -Take any medication unless instructed by your physician -Make any legal decisions or sign important papers.  Meals: Start with liquid foods such as gelatin or soup. Progress to regular foods as tolerated. Avoid greasy, spicy, heavy foods. If nausea and/or vomiting occur, drink only clear liquids until the nausea and/or vomiting subsides. Call your physician if vomiting continues.  Special Instructions/Symptoms: Your throat may feel dry or sore from the anesthesia or the breathing tube placed in your throat during surgery. If this causes discomfort, gargle with warm salt water. The discomfort should disappear within 24 hours.  If you had a scopolamine patch placed behind your ear for the management of post- operative nausea and/or vomiting:  1. The medication in the patch is effective for 72 hours, after which it should be removed.  Wrap patch in a tissue and discard in the trash. Wash hands thoroughly with soap and water. 2. You may remove the patch earlier than 72 hours if you experience unpleasant side effects which may include dry mouth, dizziness or visual disturbances. 3. Avoid touching the patch. Wash your hands with soap and water  after contact with the patch.      About my Jackson-Pratt Bulb Drain  What is a Jackson-Pratt bulb? A Jackson-Pratt is a soft, round device used to collect drainage. It is connected to a long, thin drainage catheter, which is held in place by one or two small stiches near your surgical incision site. When the bulb is squeezed, it forms a vacuum, forcing the drainage to empty into the bulb.  Emptying the Jackson-Pratt bulb- To empty the bulb: 1. Release the plug on the top of the bulb. 2. Pour the bulb's contents into a measuring container which your nurse will provide. 3. Record the time emptied and amount of drainage. Empty the drain(s) as often as your     doctor or nurse recommends.  Date                  Time                    Amount (Drain 1)                 Amount (Drain 2)  _____________________________________________________________________  _____________________________________________________________________  _____________________________________________________________________  _____________________________________________________________________  _____________________________________________________________________  _____________________________________________________________________  _____________________________________________________________________  _____________________________________________________________________  Squeezing the Jackson-Pratt Bulb- To squeeze the bulb: 1. Make sure the plug at the top of the bulb is open. 2. Squeeze the bulb tightly in your fist. You will hear air squeezing from  the bulb. 3. Replace the plug while the bulb is squeezed. 4. Use a safety pin to attach the bulb to your clothing. This will keep the catheter from     pulling at the bulb insertion site.  When to call your doctor- Call your doctor if:  Drain site becomes red, swollen or hot.  You have a fever greater than 101 degrees F.  There is oozing at the drain  site.  Drain falls out (apply a guaze bandage over the drain hole and secure it with tape).  Drainage increases daily not related to activity patterns. (You will usually have more drainage when you are active than when you are resting.)  Drainage has a bad odor.

## 2019-03-20 NOTE — Discharge Summary (Signed)
Physician Discharge Summary  Patient ID: KALAN RANG MRN: [id number] DOB/AGE: Jun 02, 1933 86 y.o.  Admit date: 03/19/2019 Discharge date: 03/20/2019  Admission Diagnoses: History colon cancer Right breast cancer Hypertension Hyperlipidemia  Discharge Diagnoses:  Active Problems:   Breast cancer, right [facility])   Discharged Condition: good  Hospital Course: 76 yof with screening detected right breast cancer. She chose to undergo mastectomy.  I did total mastectomy with axillary sn biopsy which she tolerated well. Following morning she has some minor drainage around drain but otherwise doing well and ready for dc  Consults: None  Significant Diagnostic Studies: none  Treatments: surgery: right mastectomy, right ax sn biopsy  Discharge Exam: Blood pressure 100/62, pulse 77, temperature 97.9 F (36.6 C), resp. rate 18, height 5\' 3"  (1.6 m), weight 61 kg, SpO2 95 %. Incision/Wound:small amt ecchymosis on flaps, no hematoma, incision clean, drain with thin fluid present  Disposition: Discharge disposition: 01-Home or Self Care        Allergies as of 03/20/2019      Reactions   Ace Inhibitors Cough   Escitalopram Oxalate Other (See Comments)   constipation   Lipitor [atorvastatin Calcium] Other (See Comments)   constipation   Zetia [ezetimibe] Other (See Comments)   constipation   Zocor [simvastatin - High Dose] Other (See Comments)   constipation      Medication List    TAKE these medications   amLODipine 10 MG tablet Commonly known as: NORVASC Take 1 tablet (10 mg total) by mouth daily.   aspirin 325 MG tablet Take 325 mg by mouth daily.   Cholecalciferol 25 MCG (1000 UT) tablet Take 1 tablet (1,000 Units total) by mouth daily.   methocarbamol 500 MG tablet Commonly known as: ROBAXIN Take 1 tablet (500 mg total) by mouth every 8 (eight) hours as needed (use for muscle cramps/pain).   multivitamin tablet Take 1 tablet by mouth daily.   omega-3 acid  ethyl esters 1 g capsule Commonly known as: LOVAZA Take 1 capsule (1 g total) by mouth daily.   oxyCODONE 5 MG immediate release tablet Commonly known as: Oxy IR/ROXICODONE Take 1 tablet (5 mg total) by mouth every 6 (six) hours as needed for moderate pain, severe pain or breakthrough pain.   rosuvastatin 5 MG tablet Commonly known as: CRESTOR TAKE 1 TABLET BY MOUTH TWICE A WEEK      Follow-up Information    Rolm Bookbinder, MD In 2 weeks.   Specialty: General Surgery Contact information: [address] [place] [address] [phone]           Signed: Rolm Bookbinder 03/20/2019, 5:47 AM

## 2019-03-22 LAB — SURGICAL PATHOLOGY

## 2019-03-25 ENCOUNTER — Encounter: Payer: Self-pay | Admitting: *Deleted

## 2019-03-27 NOTE — Progress Notes (Signed)
Patient Care Team: Tonia Ghent, MD as PCP - General (Family Medicine) Monna Fam, MD as Consulting Physician (Ophthalmology) Laurence Spates, MD as Consulting Physician (Gastroenterology) Henreitta Leber, DDS as Referring Physician (Dentistry) Mauro Kaufmann, RN as Oncology Nurse Navigator Rockwell Germany, RN as Oncology Nurse Navigator  DIAGNOSIS:    ICD-10-CM   1. Malignant neoplasm of upper-inner quadrant of right breast in female, estrogen receptor positive ([place])  C50.211    Z17.0     SUMMARY OF ONCOLOGIC HISTORY: Oncology History  Malignant neoplasm of upper-inner quadrant of right breast in female, estrogen receptor positive ([place])  02/13/2019 Initial Diagnosis   Routine screening mammogram detected 1.5cm right breast mass, 2.3cm on diagnostic mammogram, at the 1 o'clock position. Biopsy showed IDC, grade 2, HER-2 + by FISH, ER+ 100%, PR+ 90%, Ki67 15%.    03/19/2019 Surgery   Right mastectomy [name]): IDC, 2.6cm, grade 2, clear margins, 6 lymph nodes negative for carcinoma.    03/21/2019 -  Chemotherapy   The patient had trastuzumab-hyaluronidase-oysk (HERCEPTIN HYLECTA) 600-10000 MG-UNT/5ML chemo SQ injection 600 mg, 600 mg, Subcutaneous,  Once, 0 of 12 cycles  for chemotherapy treatment.     Genetic Testing   Negative genetic testing. No pathogenic variants identified on the [facility] STAT Breast cancer panel + Common Hereditary Cancers Panel. VUS in BRCA2 called c.5378A>G and a VUS in MUTYH called c.925C>T identified. The report date is 02/28/2019.   The STAT Breast cancer panel offered by [facility] includes sequencing and rearrangement analysis for the following 9 genes:  ATM, BRCA1, BRCA2, CDH1, CHEK2, PALB2, PTEN, STK11 and TP53.    The Common Hereditary Cancers Panel offered by [facility] includes sequencing and/or deletion duplication testing of the following 48 genes: APC, ATM, AXIN2, BARD1, BMPR1A, BRCA1, BRCA2, BRIP1, CDH1, CDKN2A (p14ARF), CDKN2A (p16INK4a),  CKD4, CHEK2, CTNNA1, DICER1, EPCAM (Deletion/duplication testing only), GREM1 (promoter region deletion/duplication testing only), KIT, MEN1, MLH1, MSH2, MSH3, MSH6, MUTYH, NBN, NF1, NHTL1, PALB2, PDGFRA, PMS2, POLD1, POLE, PTEN, RAD50, RAD51C, RAD51D, RNF43, SDHB, SDHC, SDHD, SMAD4, SMARCA4. STK11, TP53, TSC1, TSC2, and VHL.  The following genes were evaluated for sequence changes only: SDHA and HOXB13 c.251G>A variant only.     CHIEF COMPLIANT: Follow-up s/p mastectomy to review pathology  INTERVAL HISTORY: Amber [name] is a 86 y.o. with above-mentioned history of right breast cancer. She underwent a mastectomy on 03/18/18 with Dr. [name] for which pathology showed invasive ductal carcinoma, 2.6cm, grade 2, clear margins, 6 lymph nodes negative for carcinoma. She presents to the clinic today to discuss the pathology report and further treatment.  She is healing and recovering well from recent surgery.  She is sore under the arm.  ALLERGIES:  is allergic to ace inhibitors; escitalopram oxalate; lipitor [atorvastatin calcium]; zetia [ezetimibe]; and zocor [simvastatin - high dose].  MEDICATIONS:  Current Outpatient Medications  Medication Sig Dispense Refill  . amLODipine (NORVASC) 10 MG tablet Take 1 tablet (10 mg total) by mouth daily. 90 tablet 3  . anastrozole (ARIMIDEX) 1 MG tablet Take 1 tablet (1 mg total) by mouth daily. 90 tablet 3  . aspirin 325 MG tablet Take 325 mg by mouth daily.    . Cholecalciferol 1000 units tablet Take 1 tablet (1,000 Units total) by mouth daily.    . methocarbamol (ROBAXIN) 500 MG tablet Take 1 tablet (500 mg total) by mouth every 8 (eight) hours as needed (use for muscle cramps/pain). 15 tablet 0  . Multiple Vitamin (MULTIVITAMIN) tablet Take 1 tablet by mouth  daily.      . omega-3 acid ethyl esters (LOVAZA) 1 g capsule Take 1 capsule (1 g total) by mouth daily. 90 capsule 3  . oxyCODONE (OXY IR/ROXICODONE) 5 MG immediate release tablet Take 1 tablet (5 mg  total) by mouth every 6 (six) hours as needed for moderate pain, severe pain or breakthrough pain. 12 tablet 0  . rosuvastatin (CRESTOR) 5 MG tablet TAKE 1 TABLET BY MOUTH TWICE A WEEK 45 tablet 3   No current facility-administered medications for this visit.    PHYSICAL EXAMINATION: ECOG PERFORMANCE STATUS: 1 - Symptomatic but completely ambulatory  Vitals:   03/28/19 1456  BP: 136/78  Pulse: 99  Resp: 19  Temp: 97.9 F (36.6 C)  SpO2: 98%   Filed Weights   03/28/19 1456  Weight: 132 lb 14.4 oz (60.3 kg)    LABORATORY DATA:  I have reviewed the data as listed CMP Latest Ref Rng & Units 09/26/2018 09/12/2017 09/06/2016  Glucose 70 - 99 mg/dL 111(H) 105(H) 96  BUN 6 - 23 mg/dL '15 17 18  ' Creatinine 0.40 - 1.20 mg/dL 0.88 0.81 0.89  Sodium 135 - 145 mEq/L 138 140 140  Potassium 3.5 - 5.1 mEq/L 3.8 4.1 3.8  Chloride 96 - 112 mEq/L 103 104 103  CO2 19 - 32 mEq/L '27 29 27  ' Calcium 8.4 - 10.5 mg/dL 9.5 9.3 9.6  Total Protein 6.0 - 8.3 g/dL 7.0 7.2 7.3  Total Bilirubin 0.2 - 1.2 mg/dL 0.4 0.6 0.6  Alkaline Phos 39 - 117 U/L 75 77 71  AST 0 - 37 U/L '16 17 15  ' ALT 0 - 35 U/L '10 15 12    ' Lab Results  Component Value Date   WBC 8.3 09/28/2017   HGB 14.0 09/28/2017   HCT 40.3 09/28/2017   MCV 88.6 09/28/2017   PLT 298.0 09/28/2017   NEUTROABS 4.6 09/28/2017    ASSESSMENT & PLAN:  Malignant neoplasm of upper-inner quadrant of right breast in female, estrogen receptor positive ([place]) 03/09/2019: Right mastectomy [name]): IDC, 2.6cm, grade 2, clear margins, 6 lymph nodes negative for carcinoma.,  ER 100%, PR 90%, HER-2 equivocal by IHC, FISH positive ratio 2.76, copy #6.2, Ki-67 15%  Pathology counseling: I discussed the final pathology report of the patient provided  a copy of this report. I discussed the margins as well as lymph node surgeries. We also discussed the final staging along with previously performed ER/PR and HER-2/neu testing.  Treatment plan: 1. Adjuvant  Herceptin every 3 weeks.    Subcutaneously 2. Adjuvant antiestrogen therapy with anastrozole 1 mg daily. We discussed the pros and cons of antiestrogen therapy as well as Herceptin. Echocardiogram done January 2021: EF 60 to 65%  Return to clinic in 3 weeks to start Herceptin treatments.    No orders of the defined types were placed in this encounter.  The patient has a good understanding of the overall plan. she agrees with it. she will call with any problems that may develop before the next visit here.  Total time spent: 30 mins including face to face time and time spent for planning, charting and coordination of care  Nicholas Lose, MD 03/28/2019  I, Cloyde Reams Dorshimer, am acting as scribe for Dr. Nicholas Lose.  I have reviewed the above documentation for accuracy and completeness, and I agree with the above.

## 2019-03-28 ENCOUNTER — Inpatient Hospital Stay: Payer: Medicare HMO | Attending: Hematology and Oncology | Admitting: Hematology and Oncology

## 2019-03-28 ENCOUNTER — Encounter: Payer: Self-pay | Admitting: *Deleted

## 2019-03-28 ENCOUNTER — Other Ambulatory Visit: Payer: Self-pay

## 2019-03-28 DIAGNOSIS — Z9221 Personal history of antineoplastic chemotherapy: Secondary | ICD-10-CM | POA: Insufficient documentation

## 2019-03-28 DIAGNOSIS — Z9011 Acquired absence of right breast and nipple: Secondary | ICD-10-CM | POA: Diagnosis not present

## 2019-03-28 DIAGNOSIS — Z170 Estrogen receptor positive status [ER+]: Secondary | ICD-10-CM | POA: Diagnosis not present

## 2019-03-28 DIAGNOSIS — C50211 Malignant neoplasm of upper-inner quadrant of right female breast: Secondary | ICD-10-CM

## 2019-03-28 DIAGNOSIS — Z7982 Long term (current) use of aspirin: Secondary | ICD-10-CM | POA: Insufficient documentation

## 2019-03-28 DIAGNOSIS — Z79899 Other long term (current) drug therapy: Secondary | ICD-10-CM | POA: Diagnosis not present

## 2019-03-28 MED ORDER — ANASTROZOLE 1 MG PO TABS: 1.0000 mg | ORAL_TABLET | Freq: Every day | ORAL | 3 refills | Status: DC

## 2019-03-28 NOTE — Assessment & Plan Note (Signed)
03/09/2019: Right mastectomy Amber [name]): IDC, 2.6cm, grade 2, clear margins, 6 lymph nodes negative for carcinoma.,  ER 100%, PR 90%, HER-2 equivocal by IHC, FISH positive ratio 2.76, copy #6.2, Ki-67 15%  Pathology counseling: I discussed the final pathology report of the patient provided  a copy of this report. I discussed the margins as well as lymph node surgeries. We also discussed the final staging along with previously performed ER/PR and HER-2/neu testing.  Treatment plan: 1. Adjuvant Herceptin every 3 weeks.    Subcutaneously 2. Adjuvant antiestrogen therapy with anastrozole 1 mg daily.  Return to clinic in 3 weeks to start Herceptin treatments.

## 2019-03-29 ENCOUNTER — Telehealth: Payer: Self-pay | Admitting: Hematology and Oncology

## 2019-03-29 NOTE — Telephone Encounter (Signed)
I left a message regarding schedule  

## 2019-04-02 ENCOUNTER — Encounter: Payer: Self-pay | Admitting: *Deleted

## 2019-04-03 ENCOUNTER — Ambulatory Visit: Payer: Medicare HMO | Admitting: Physical Therapy

## 2019-04-03 ENCOUNTER — Ambulatory Visit: Payer: Medicare HMO | Attending: General Surgery | Admitting: Physical Therapy

## 2019-04-03 DIAGNOSIS — M25511 Pain in right shoulder: Secondary | ICD-10-CM | POA: Diagnosis not present

## 2019-04-03 DIAGNOSIS — I972 Postmastectomy lymphedema syndrome: Secondary | ICD-10-CM

## 2019-04-03 DIAGNOSIS — M25611 Stiffness of right shoulder, not elsewhere classified: Secondary | ICD-10-CM | POA: Diagnosis not present

## 2019-04-03 DIAGNOSIS — Z483 Aftercare following surgery for neoplasm: Secondary | ICD-10-CM | POA: Insufficient documentation

## 2019-04-03 NOTE — Therapy (Signed)
[place], [place], [address] Phone: [phone]   Fax:  [phone]  Physical Therapy Evaluation  Patient Details  Name: Amber [name] MRN: [id number] Date of Birth: October 08, 1933 Referring Provider (PT): Dr. [name]    Encounter Date: 04/03/2019  PT End of Session - 04/03/19 1410    Visit Number  1    Number of Visits  9    Date for PT Re-Evaluation  05/01/19    PT Start Time  1200    PT Stop Time  1245    PT Time Calculation (min)  45 min    Activity Tolerance  Patient tolerated treatment well    Behavior During Therapy  [facility] for tasks assessed/performed       Past Medical History:  Diagnosis Date  . Anemia   . Blood transfusion without reported diagnosis    during treatment for colon CA  . Breast cancer ([place]) 2020   right breast IDC  . C. difficile colitis    history of  . Cancer [facility])    Colon, s/p partial colectomy, no chemo or radiation  . Diverticulitis   . Family history of bladder cancer   . Family history of breast cancer   . Family history of colon cancer   . Family history of throat cancer   . Hyperlipidemia   . Hypertension   . Urinary incontinence    stress incont.  . Urinary tract infection     Past Surgical History:  Procedure Laterality Date  . ABDOMINAL HYSTERECTOMY    . BLADDER SUSPENSION    . CARDIOVASCULAR STRESS TEST  11/02/2004   EF 76%  . CATARACT EXTRACTION    . COLON SURGERY  2010   colon cancer  . MASTECTOMY W/ SENTINEL NODE BIOPSY Right 03/19/2019   Procedure: RIGHT MASTECTOMY WITH RIGHT AXILLARY SENTINEL LYMPH NODE BIOPSY;  Surgeon: Rolm Bookbinder, MD;  Location: [place];  Service: General;  Laterality: Right;    There were no vitals filed for this visit.   Subjective Assessment - 04/03/19 1205    Subjective  Pt says "It swollen" in her right shoudler.  She wants to get a bra as she is still wearing her binder.  She will start  chemo on 04/16/2019    Pertinent History  right breast cancer with total mastectomy with 6 nodes removed on 03/19/2019. She had her drain removed on Friday. She will have chemo, but no radiation Past history includes colon cancer    Patient Stated Goals  to get the swelling and soreness out of her shoulder    Currently in Pain?  Yes    Pain Score  3     Pain Location  Axilla    Pain Orientation  Right    Pain Descriptors / Indicators  Sore    Pain Type  Surgical pain    Pain Radiating Towards  radiates to shoulder    Pain Onset  1 to 4 weeks ago    Pain Frequency  Constant    Aggravating Factors   can't say    Pain Relieving Factors  can't say         [facility] PT Assessment - 04/03/19 0001      Assessment   Medical Diagnosis  right breast cancer     Referring Provider (PT)  Dr. [name]     Onset Date/Surgical Date  03/19/19    Hand Dominance  Right  Precautions   Precautions  Other (comment)   at risk for lymphedema     Restrictions   Weight Bearing Restrictions  No      Balance Screen   Has the patient fallen in the past 6 months  No    Has the patient had a decrease in activity level because of a fear of falling?   No    Is the patient reluctant to leave their home because of a fear of falling?   No      Home Social worker  Private residence    Living Arrangements  Alone    Available Help at Discharge  Available PRN/intermittently      Prior Function   Level of [place]  Retired    Leisure  she does alot of her house and yard work , not regular exercise, but stays active       Cognition   Overall Cognitive Status  Within Functional Limits for tasks assessed      Observation/Other Assessments   Observations  pt with healing incision in right chest with large anount of  tender fullness at anterior axilla above binder and contraction of incision  at lateral chest and tightness around ribs  bandaid over tube site      Skin Integrity  steri strips on well healing incision     Quick DASH   31.82      Sensation   Light Touch  Not tested      Coordination   Gross Motor Movements are Fluid and Coordinated  No   "pulling" in right axilla when she tries to raise her arm      Posture/Postural Control   Posture/Postural Control  Postural limitations    Postural Limitations  Rounded Shoulders;Forward head      ROM / Strength   AROM / PROM / Strength  AROM;Strength      AROM   Right Shoulder Extension  55 Degrees    Right Shoulder Flexion  132 Degrees    Right Shoulder ABduction  108 Degrees    Right Shoulder External Rotation  55 Degrees    Left Shoulder Flexion  180 Degrees    Left Shoulder ABduction  185 Degrees    Left Shoulder External Rotation  90 Degrees      Strength   Overall Strength  Deficits;Due to pain    Overall Strength Comments  right arm 2+/5 due to pulling in axilla and pain in shoulder       Palpation   Palpation comment  very tender fullness in anterior axilla and tightess and congestion in lateral chest with very little skin excursion         LYMPHEDEMA/ONCOLOGY QUESTIONNAIRE - 04/03/19 1226      Right Upper Extremity Lymphedema   10 cm Proximal to Olecranon Process  26.5 cm    Olecranon Process  24 cm    15 cm Proximal to Ulnar Styloid Process  22.4 cm    10 cm Proximal to Ulnar Styloid Process  20 cm    Just Proximal to Ulnar Styloid Process  15.8 cm    Across Hand at [employer]  18 cm    At [place] of 2nd Digit  6.3 cm      Left Upper Extremity Lymphedema   10 cm Proximal to Olecranon Process  25.5 cm    Olecranon Process  22.4 cm    15 cm Proximal to  Ulnar Styloid Process  22 cm    10 cm Proximal to Ulnar Styloid Process  20 cm    Just Proximal to Ulnar Styloid Process  15.6 cm    Across Hand at [employer]  17.9 cm    At [place] of 2nd Digit  5.8 cm          Quick Dash - 04/03/19 0001    Open a tight or new jar  Moderate difficulty    Do heavy  household chores (wash walls, wash floors)  Moderate difficulty    Carry a shopping bag or briefcase  Mild difficulty    Wash your back  Mild difficulty    Use a knife to cut food  No difficulty    Recreational activities in which you take some force or impact through your arm, shoulder, or hand (golf, hammering, tennis)  Moderate difficulty    During the past week, to what extent has your arm, shoulder or hand problem interfered with your normal social activities with family, friends, neighbors, or groups?  Modererately    During the past week, to what extent has your arm, shoulder or hand problem limited your work or other regular daily activities  Modererately    Arm, shoulder, or hand pain.  Mild    Tingling (pins and needles) in your arm, shoulder, or hand  None    Difficulty Sleeping  Mild difficulty    DASH Score  31.82 %        Objective measurements completed on examination: See above findings.      [place] Adult PT Treatment/Exercise - 04/03/19 0001      Self-Care   Self-Care  Other Self-Care Comments    Other Self-Care Comments   provided small patch of small dotted foam to wear inside binder at anterior axilla.  Also suggested she loosen binder a bit as it may be too snug around her chest Gave pt information about second to nature with suggested styles to try and also information about knitted knockers from [employer]                PT Education - 04/03/19 1409    Education Details  where to get a compression bra and which ones to look at, how to get a knitted knocker from [employer]) Educated  Patient    Methods  Explanation;Handout    Comprehension  Verbalized understanding          PT Long Term Goals - 04/03/19 1419      PT LONG TERM GOAL #1   Title  Pt will report the pain and fullness in right axilla and shoudler is decrased by 50%    Time  4    Period  Weeks    Status  New      PT LONG TERM GOAL #2   Title  Pt will be independent in a HEP for  right shoudler ROM and strength    Time  4    Period  Weeks    Status  New      PT LONG TERM GOAL #3   Title  Pt will know how to manage her fullness at home with self MLD and compression    Time  4    Period  Weeks    Status  New      PT LONG TERM GOAL #4   Title  Pt will decrease her Quick DASH to < 20 indicating an improvment  in her UE function    Baseline  31.82    Time  4    Period  Weeks    Status  New             Plan - 04/03/19 1412    Clinical Impression Statement  Mrs. Eslick presents to PT 2 weeks after right mastectomy with 6 nodes removed.  She is healing well, but has tender, firm fullness above her bandeau and at lateral chest.  Her ROM and strength are  limited by pain and "pulling"  She does not have lymphedema. She will benfit from gentle MLD with proression to exericse and compression bra    Personal Factors and Comorbidities  Age;Comorbidity 2;Other   lives 15 miles away from our clinic   Comorbidities  previous colon cancer and  surgery , lives alone , will have infusions    Examination-Activity Limitations  Reach Overhead;Lift    Examination-Participation Restrictions  Yard Work;Cleaning;Laundry;Meal Prep    Stability/Clinical Decision Making  Evolving/Moderate complexity    Clinical Decision Making  Moderate    Rehab Potential  Excellent    PT Frequency  2x / week    PT Duration  4 weeks    PT Treatment/Interventions  ADLs/Self Care Home Management;Therapeutic activities;Therapeutic exercise;Neuromuscular re-education;Orthotic Fit/Training;Patient/family education;Manual techniques;Manual lymph drainage;Compression bandaging;Scar mobilization;Taping;Passive range of motion    PT Next Visit Plan  manual lymph drainage and gentle ROM progress exercises.  See if she got compression bra    Recommended Other Services  script request sent to Bary Castilla to send to second to nature    Consulted and Agree with Plan of Care  Patient       Patient will benefit  from skilled therapeutic intervention in order to improve the following deficits and impairments:  Decreased knowledge of use of DME, Decreased range of motion, Decreased strength, Decreased scar mobility, Decreased skin integrity, Increased edema, Increased fascial restricitons, Impaired UE functional use, Postural dysfunction, Pain, Impaired perceived functional ability, Increased muscle spasms  Visit Diagnosis: Aftercare following surgery for neoplasm - Plan: PT plan of care cert/re-cert  Stiffness of right shoulder joint - Plan: PT plan of care cert/re-cert  Acute pain of right shoulder - Plan: PT plan of care cert/re-cert  Postmastectomy lymphedema - Plan: PT plan of care cert/re-cert     Problem List Patient Active Problem List   Diagnosis Date Noted  . Breast cancer, right ([place]) 03/19/2019  . Genetic testing 02/28/2019  . Family history of breast cancer   . Family history of colon cancer   . Family history of throat cancer   . Family history of bladder cancer   . Malignant neoplasm of upper-inner quadrant of right breast in female, estrogen receptor positive ([place]) 02/13/2019  . Medicare annual wellness visit, subsequent 10/08/2018  . Osteopenia 09/02/2015  . Advance care planning 09/02/2015  . TMJ syndrome 02/07/2012  . Fatigue 06/06/2011  . Benign hypertensive heart disease without heart failure 10/18/2010  . Pure hypercholesterolemia 06/22/2010  . Back pain 06/22/2010  . History of colon cancer 06/22/2010  . Stress incontinence, female 06/22/2010  . History of Clostridium difficile infection 06/22/2010   Donato Heinz. Owens Shark PT  Norwood Levo 04/03/2019, 2:23 PM  [place], [place], [address] Phone: [phone]   Fax:  [phone]  Name: LISSY WERNING MRN: [id number] Date of Birth: 1933-10-02

## 2019-04-03 NOTE — Patient Instructions (Signed)
First of all, check with your insurance company to see if provider is in [place] (for wigs and compression sleeves / gloves/gauntlets )  [place], [place] [address] [phone]  Will file some insurances --- call for appointment   Second to [facility] (for mastectomy prosthetics and garments) [place], [place] [address] [phone] Will file some insurances --- call for appointment  Look at [place], [place], [place] and get the one the style that is most comfortable   [facility]  [address]  [place], [place] [address] [phone] Lower extremity garments  [facility] [place] [address] [place], [place]  [address] [place] ( Medicaid certified lymphedema fitter) [phone] [email or web address]  [place]  [address] [place], [place] [address] [phone]  Other Resources: National Lymphedema Network:  www.lymphnet.org www.Klosetraining.com for patient articles and self manual lymph drainage information www.lymphedemablog.com has informative articles.  DishTag.es.com www.lymphedemaproducts.com www.brightlifedirect.com [email or web address]

## 2019-04-04 ENCOUNTER — Ambulatory Visit: Payer: Medicare HMO | Admitting: Physical Therapy

## 2019-04-04 ENCOUNTER — Ambulatory Visit: Payer: Medicare HMO

## 2019-04-04 ENCOUNTER — Other Ambulatory Visit: Payer: Self-pay

## 2019-04-04 DIAGNOSIS — M25611 Stiffness of right shoulder, not elsewhere classified: Secondary | ICD-10-CM

## 2019-04-04 DIAGNOSIS — M25511 Pain in right shoulder: Secondary | ICD-10-CM

## 2019-04-04 DIAGNOSIS — Z483 Aftercare following surgery for neoplasm: Secondary | ICD-10-CM | POA: Diagnosis not present

## 2019-04-04 DIAGNOSIS — I972 Postmastectomy lymphedema syndrome: Secondary | ICD-10-CM

## 2019-04-04 NOTE — Patient Instructions (Signed)
   Deep Effective Breath   Standing, sitting, or laying down, place both hands on the belly. Take a deep breath IN, expanding the belly; then breath OUT, contracting the belly. Repeat __5__ times. Do __2-3__ sessions per day and before your self massage.  http://gt2.exer.us/866   Copyright  VHI. All rights reserved.  Axilla to Axilla - Sweep   On both sides make 5 circles in the armpit, then pump _5__ times from involved armpit across chest to uninvolved armpit, making a pathway. You can spend extra time here to move fluid.  Do _1__ time per day.  Copyright  VHI. All rights reserved.  Axilla to Inguinal Nodes - Sweep   On involved side, make 5 circles at groin at panty line, then pump _5__ times from armpit along side of trunk to outer hip, making your other pathway. You can spend extra time here to move fluid.  Do __1_ time per day.  Copyright  VHI. All rights reserved.  Arm Posterior: Elbow to Shoulder - Sweep   Pump _5__ times from back of elbow to top of shoulder. Then inner to outer upper arm _5_ times, then outer arm again _5_ times. Then back to the pathways _2-3_ times. Do _1__ time per day.    Then retrace all your steps working back up the forearm, doing both sides; upper outer arm and back to your pathways _2-3_ times each. Then do 5 circles again at armpits and involved groin where you started! Good job!! Do __1_ time per day.  Copyright  VHI. All rights reserved.

## 2019-04-04 NOTE — Therapy (Signed)
[place], [place], [address] Phone: [phone]   Fax:  [phone]  Physical Therapy Treatment  Patient Details  Name: Amber [name] MRN: [id number] Date of Birth: December 14, 1933 Referring Provider (PT): Dr. [name]    Encounter Date: 04/04/2019  PT End of Session - 04/04/19 1258    Visit Number  2    Number of Visits  9    Date for PT Re-Evaluation  05/01/19    PT Start Time  1300    PT Stop Time  1353    PT Time Calculation (min)  53 min    Activity Tolerance  Patient tolerated treatment well    Behavior During Therapy  [facility] for tasks assessed/performed       Past Medical History:  Diagnosis Date  . Anemia   . Blood transfusion without reported diagnosis    during treatment for colon CA  . Breast cancer ([place]) 2020   right breast IDC  . C. difficile colitis    history of  . Cancer [facility])    Colon, s/p partial colectomy, no chemo or radiation  . Diverticulitis   . Family history of bladder cancer   . Family history of breast cancer   . Family history of colon cancer   . Family history of throat cancer   . Hyperlipidemia   . Hypertension   . Urinary incontinence    stress incont.  . Urinary tract infection     Past Surgical History:  Procedure Laterality Date  . ABDOMINAL HYSTERECTOMY    . BLADDER SUSPENSION    . CARDIOVASCULAR STRESS TEST  11/02/2004   EF 76%  . CATARACT EXTRACTION    . COLON SURGERY  2010   colon cancer  . MASTECTOMY W/ SENTINEL NODE BIOPSY Right 03/19/2019   Procedure: RIGHT MASTECTOMY WITH RIGHT AXILLARY SENTINEL LYMPH NODE BIOPSY;  Surgeon: Rolm Bookbinder, MD;  Location: [place];  Service: General;  Laterality: Right;    There were no vitals filed for this visit.  Subjective Assessment - 04/04/19 1258    Subjective  Pt reports that [facility]. She states that she is still sore in her R  shoulder/breast area.    Pertinent History  right breast cancer with total mastectomy with 6 nodes removed on 03/19/2019. She had her drain removed on Friday. She will have chemo, but no radiation Past history includes colon cancer    Patient Stated Goals  to get the swelling and soreness out of her shoulder    Currently in Pain?  Yes    Pain Score  3     Pain Location  Axilla    Pain Orientation  Right    Pain Descriptors / Indicators  Sore    Pain Type  Surgical pain    Pain Radiating Towards  radiates to shoulder    Pain Onset  1 to 4 weeks ago    Pain Frequency  Constant                       [facility] Adult PT Treatment/Exercise - 04/04/19 0001      Manual Therapy   Manual Therapy  Manual Lymphatic Drainage (MLD);Soft tissue mobilization    Soft tissue mobilization  very light STM over the superior portion of the pectoralis major at the portion at insertion that is very tight; minimal relief following light STM.  Manual Lymphatic Drainage (MLD)  in supine: short neck, swimming in the terminus, Bil shoulders, Bil axillary nodes, R inguinal nodes, 5 diaphragmatic breathes, anterior inter-axillary anastomosis, R axillo-inguinal anastomosis, lateral R brachium, medial to lateral R brachium, lateral R brachium, re-worked all surfaces discussed spending extratime at the anastomosis in order to move fluid. Pt then repeated MLD in supine on herself with tactile cueing, VC and demonstration from the physical therapist with an emphasis on direction, skin stretch and pressure.              PT Education - 04/04/19 1332    Education Details  Pt was educated on self MLD this session. Discussed avoiding pulling on her scar at this time due to it has not quite healed.    Person(s) Educated  Patient    Methods  Explanation;Tactile cues;Verbal cues;Handout;Demonstration    Comprehension  Verbalized understanding;Returned demonstration          PT Long Term Goals - 04/03/19  1419      PT LONG TERM GOAL #1   Title  Pt will report the pain and fullness in right axilla and shoudler is decrased by 50%    Time  4    Period  Weeks    Status  New      PT LONG TERM GOAL #2   Title  Pt will be independent in a HEP for right shoudler ROM and strength    Time  4    Period  Weeks    Status  New      PT LONG TERM GOAL #3   Title  Pt will know how to manage her fullness at home with self MLD and compression    Time  4    Period  Weeks    Status  New      PT LONG TERM GOAL #4   Title  Pt will decrease her Quick DASH to < 20 indicating an improvment in her UE function    Baseline  31.82    Time  4    Period  Weeks    Status  New            Plan - 04/04/19 1257    Clinical Impression Statement  Pt presents to physical therapy with continued edema in her R axillary area and lower lateral R chest wall below the incision. She was wearing her binder and small nubbed foam. Palpable tightness/tenderness noted throughout the R pectoralis major; minimal release following very light STM at the superior portion below the collar bone and at the insertion. MLD was performed for the anterior trunk and the R brachium, followed by pt return demonstration with physical therapist VC, tactile cueing and demonstration throughout. 1/2 inch gray foam was provided to put under small nubbed foam this session and was provided with a second piece for the area under the incision. Pt will benefit from continued POC at this time.    Personal Factors and Comorbidities  Age;Comorbidity 2;Other    Comorbidities  previous colon cancer and  surgery , lives alone , will have infusions    Examination-Activity Limitations  Reach Overhead;Lift    Examination-Participation Restrictions  Yard Work;Cleaning;Laundry;Meal Prep    Rehab Potential  Excellent    PT Frequency  2x / week    PT Duration  4 weeks    PT Treatment/Interventions  ADLs/Self Care Home Management;Therapeutic activities;Therapeutic  exercise;Neuromuscular re-education;Orthotic Fit/Training;Patient/family education;Manual techniques;Manual lymph drainage;Compression bandaging;Scar mobilization;Taping;Passive range of motion  PT Next Visit Plan  manual lymph drainage and gentle ROM progress exercises.  [facility]    PT Home Exercise Plan  provide with handout for self MLD next session.    Consulted and Agree with Plan of Care  Patient       Patient will benefit from skilled therapeutic intervention in order to improve the following deficits and impairments:  Decreased knowledge of use of DME, Decreased range of motion, Decreased strength, Decreased scar mobility, Decreased skin integrity, Increased edema, Increased fascial restricitons, Impaired UE functional use, Postural dysfunction, Pain, Impaired perceived functional ability, Increased muscle spasms  Visit Diagnosis: Aftercare following surgery for neoplasm  Stiffness of right shoulder joint  Acute pain of right shoulder  Postmastectomy lymphedema     Problem List Patient Active Problem List   Diagnosis Date Noted  . Breast cancer, right ([place]) 03/19/2019  . Genetic testing 02/28/2019  . Family history of breast cancer   . Family history of colon cancer   . Family history of throat cancer   . Family history of bladder cancer   . Malignant neoplasm of upper-inner quadrant of right breast in female, estrogen receptor positive ([place]) 02/13/2019  . Medicare annual wellness visit, subsequent 10/08/2018  . Osteopenia 09/02/2015  . Advance care planning 09/02/2015  . TMJ syndrome 02/07/2012  . Fatigue 06/06/2011  . Benign hypertensive heart disease without heart failure 10/18/2010  . Pure hypercholesterolemia 06/22/2010  . Back pain 06/22/2010  . History of colon cancer 06/22/2010  . Stress incontinence, female 06/22/2010  . History of Clostridium difficile infection 06/22/2010    Ander Purpura , PT 04/04/2019, 1:54  PM  [place], [place], [address] Phone: [phone]   Fax:  [phone]  Name: VAISHNAVI KINDIG MRN: [id number] Date of Birth: 1933-05-01

## 2019-04-10 ENCOUNTER — Ambulatory Visit: Payer: Medicare HMO | Admitting: Physical Therapy

## 2019-04-10 ENCOUNTER — Other Ambulatory Visit: Payer: Self-pay

## 2019-04-10 ENCOUNTER — Encounter: Payer: Self-pay | Admitting: Physical Therapy

## 2019-04-10 DIAGNOSIS — M25611 Stiffness of right shoulder, not elsewhere classified: Secondary | ICD-10-CM | POA: Diagnosis not present

## 2019-04-10 DIAGNOSIS — C50911 Malignant neoplasm of unspecified site of right female breast: Secondary | ICD-10-CM | POA: Diagnosis not present

## 2019-04-10 DIAGNOSIS — I972 Postmastectomy lymphedema syndrome: Secondary | ICD-10-CM | POA: Diagnosis not present

## 2019-04-10 DIAGNOSIS — M25511 Pain in right shoulder: Secondary | ICD-10-CM | POA: Diagnosis not present

## 2019-04-10 DIAGNOSIS — Z483 Aftercare following surgery for neoplasm: Secondary | ICD-10-CM | POA: Diagnosis not present

## 2019-04-10 NOTE — Therapy (Addendum)
[place], [place], [address] Phone: [phone]   Fax:  [phone]  Physical Therapy Treatment  Patient Details  Name: Amber [name] MRN: [id number] Date of Birth: 1933/12/26 Referring Provider (PT): Dr. [name]    Encounter Date: 04/10/2019  PT End of Session - 04/10/19 1314    Visit Number  3    Number of Visits  9    Date for PT Re-Evaluation  05/01/19    PT Start Time  1210    PT Stop Time  1250    PT Time Calculation (min)  40 min    Activity Tolerance  Patient tolerated treatment well    Behavior During Therapy  [facility] for tasks assessed/performed       Past Medical History:  Diagnosis Date  . Anemia   . Blood transfusion without reported diagnosis    during treatment for colon CA  . Breast cancer ([place]) 2020   right breast IDC  . C. difficile colitis    history of  . Cancer [facility])    Colon, s/p partial colectomy, no chemo or radiation  . Diverticulitis   . Family history of bladder cancer   . Family history of breast cancer   . Family history of colon cancer   . Family history of throat cancer   . Hyperlipidemia   . Hypertension   . Urinary incontinence    stress incont.  . Urinary tract infection     Past Surgical History:  Procedure Laterality Date  . ABDOMINAL HYSTERECTOMY    . BLADDER SUSPENSION    . CARDIOVASCULAR STRESS TEST  11/02/2004   EF 76%  . CATARACT EXTRACTION    . COLON SURGERY  2010   colon cancer  . MASTECTOMY W/ SENTINEL NODE BIOPSY Right 03/19/2019   Procedure: RIGHT MASTECTOMY WITH RIGHT AXILLARY SENTINEL LYMPH NODE BIOPSY;  Surgeon: Rolm Bookbinder, MD;  Location: [place];  Service: General;  Laterality: Right;    There were no vitals filed for this visit.  Subjective Assessment - 04/10/19 1307    Subjective  Pt reports she is doing better.  She has her appointment at [facility] to [place] today after this appt.  She is anxious about  her infusion next week as the weather may be bad and she has a long drive to get to cancer center    Pertinent History  right breast cancer with total mastectomy with 6 nodes removed on 03/19/2019. She had her drain removed on Friday. She will have chemo, but no radiation Past history includes colon cancer    Patient Stated Goals  to get the swelling and soreness out of her shoulder    Currently in Pain?  No/denies                       [facility] Adult PT Treatment/Exercise - 04/10/19 0001      Exercises   Exercises  Shoulder      Shoulder Exercises: Supine   Protraction  AROM    Protraction Limitations  with visual inspection of scar to make sure there is not tension on healing tissue     External Rotation  AROM;Right    Other Supine Exercises  small circles with hand pointed to ceiling     Other Supine Exercises  with right arm in 90 degrees abduction and external rotation, lower trunk rotation within availabe range with deep breathing  Manual Therapy   Manual Therapy  Edema management;Soft tissue mobilization;Manual Lymphatic Drainage (MLD);Passive ROM    Manual therapy comments  with contact on both sides of incision so there was not tension on pulling it apart, very gentle movment of skin at lateral portions     Manual Lymphatic Drainage (MLD)  in supine, short neck, superficial and deep abdominals, right inguinal nodes, right axillo-inguinal anastamoisis, left axillary nodes and anteirior interaxillary anastamosis with care not to pull too much on healing incision. then eith right arm overhead, right axilla and rmore work on lateral trunk, then to sidelying for posterior interaxillary anastamosis     Passive ROM  to right arm insupine into felxion and diagonals                   PT Long Term Goals - 04/10/19 1319      PT LONG TERM GOAL #1   Title  Pt will report the pain and fullness in right axilla and shoudler is decrased by 50%    Time  4    Period   Weeks    Status  On-going      PT LONG TERM GOAL #2   Title  Pt will be independent in a HEP for right shoudler ROM and strength    Time  4    Period  Weeks    Status  On-going      PT LONG TERM GOAL #3   Title  Pt will know how to manage her fullness at home with self MLD and compression    Time  4    Period  Weeks    Status  On-going      PT LONG TERM GOAL #4   Title  Pt will decrease her Quick DASH to < 20 indicating an improvment in her UE function    Baseline  31.82    Time  4    Period  Weeks    Status  On-going            Plan - 04/10/19 1314    Clinical Impression Statement  Pt is making improvments in shoulder ROM and use of arm in self care.  Fullness at axilla is much softer and lateral trunk is beginning to decongest aslo.  Pt will get compression bra and that should help also.  Expect she will continue to heal with time . Pt is concerned about the long drive to our clnic in bad weather and is not sure how she will do after her infusion next week. She will come on Monday if weather is good and then cancel Thrusday.  Feel she will be able to continue to improve on her own at home with more time in between her visits here.    Personal Factors and Comorbidities  Age;Comorbidity 2;Other    Comorbidities  previous colon cancer and  surgery , lives alone , will have infusions    Examination-Activity Limitations  Reach Overhead;Lift    Examination-Participation Restrictions  Yard Work;Cleaning;Laundry;Meal Prep    Stability/Clinical Decision Making  Evolving/Moderate complexity    Rehab Potential  Excellent    PT Frequency  2x / week    PT Duration  4 weeks    PT Treatment/Interventions  ADLs/Self Care Home Management;Therapeutic activities;Therapeutic exercise;Neuromuscular re-education;Orthotic Fit/Training;Patient/family education;Manual techniques;Manual lymph drainage;Compression bandaging;Scar mobilization;Taping;Passive range of motion    PT Next Visit Plan  Assess  compression bras and foam patches to help with swelling.  Teach shoulder Stretching for  her her to continue at home and determine if she will be ok to decrease visits here to one time a week. cont. manual lymph drainage and gentle ROM progress exercises.  pt [facility] on 04/10/19    PT Home Exercise Plan  provide with handout for self MLD next session.       Patient will benefit from skilled therapeutic intervention in order to improve the following deficits and impairments:  Decreased knowledge of use of DME, Decreased range of motion, Decreased strength, Decreased scar mobility, Decreased skin integrity, Increased edema, Increased fascial restricitons, Impaired UE functional use, Postural dysfunction, Pain, Impaired perceived functional ability, Increased muscle spasms  Visit Diagnosis: Stiffness of right shoulder joint  Acute pain of right shoulder  Postmastectomy lymphedema  Aftercare following surgery for neoplasm     Problem List Patient Active Problem List   Diagnosis Date Noted  . Breast cancer, right ([place]) 03/19/2019  . Genetic testing 02/28/2019  . Family history of breast cancer   . Family history of colon cancer   . Family history of throat cancer   . Family history of bladder cancer   . Malignant neoplasm of upper-inner quadrant of right breast in female, estrogen receptor positive ([place]) 02/13/2019  . Medicare annual wellness visit, subsequent 10/08/2018  . Osteopenia 09/02/2015  . Advance care planning 09/02/2015  . TMJ syndrome 02/07/2012  . Fatigue 06/06/2011  . Benign hypertensive heart disease without heart failure 10/18/2010  . Pure hypercholesterolemia 06/22/2010  . Back pain 06/22/2010  . History of colon cancer 06/22/2010  . Stress incontinence, female 06/22/2010  . History of Clostridium difficile infection 06/22/2010   Donato Heinz. Owens Shark, PT  [place] 04/10/2019, 1:20 PM  [place], [place], [address] Phone: [phone]   Fax:  [phone]  Name: Amber [name] MRN: [id number] Date of Birth: [birth year]  PHYSICAL THERAPY DISCHARGE SUMMARY  Visits from Start of Care: 3  Current functional level related to goals / functional outcomes: unknown   Remaining deficits: unknown   Education / Equipment: Home exercise, lymphedema management  Plan: Patient agrees to discharge.  Patient goals were partially met. Patient is being discharged due to not returning since the last visit.  ?????    Maudry Diego, PT 08/26/19 10:47 AM

## 2019-04-16 ENCOUNTER — Other Ambulatory Visit: Payer: Self-pay

## 2019-04-16 ENCOUNTER — Encounter: Payer: Self-pay | Admitting: *Deleted

## 2019-04-16 ENCOUNTER — Encounter: Payer: Self-pay | Admitting: Adult Health

## 2019-04-16 ENCOUNTER — Encounter: Payer: Self-pay | Admitting: Hematology and Oncology

## 2019-04-16 ENCOUNTER — Inpatient Hospital Stay: Payer: Medicare HMO | Admitting: Adult Health

## 2019-04-16 ENCOUNTER — Inpatient Hospital Stay: Payer: Medicare HMO | Attending: Hematology and Oncology

## 2019-04-16 VITALS — BP 112/54 | HR 91 | Temp 98.7°F | Resp 18 | Ht 63.0 in | Wt 131.2 lb

## 2019-04-16 DIAGNOSIS — E785 Hyperlipidemia, unspecified: Secondary | ICD-10-CM | POA: Insufficient documentation

## 2019-04-16 DIAGNOSIS — Z8052 Family history of malignant neoplasm of bladder: Secondary | ICD-10-CM | POA: Insufficient documentation

## 2019-04-16 DIAGNOSIS — R5383 Other fatigue: Secondary | ICD-10-CM | POA: Insufficient documentation

## 2019-04-16 DIAGNOSIS — Z5111 Encounter for antineoplastic chemotherapy: Secondary | ICD-10-CM | POA: Diagnosis not present

## 2019-04-16 DIAGNOSIS — Z9011 Acquired absence of right breast and nipple: Secondary | ICD-10-CM | POA: Insufficient documentation

## 2019-04-16 DIAGNOSIS — Z800 Family history of malignant neoplasm of digestive organs: Secondary | ICD-10-CM | POA: Insufficient documentation

## 2019-04-16 DIAGNOSIS — Z803 Family history of malignant neoplasm of breast: Secondary | ICD-10-CM | POA: Insufficient documentation

## 2019-04-16 DIAGNOSIS — Z85038 Personal history of other malignant neoplasm of large intestine: Secondary | ICD-10-CM | POA: Insufficient documentation

## 2019-04-16 DIAGNOSIS — I10 Essential (primary) hypertension: Secondary | ICD-10-CM | POA: Insufficient documentation

## 2019-04-16 DIAGNOSIS — C50211 Malignant neoplasm of upper-inner quadrant of right female breast: Secondary | ICD-10-CM | POA: Diagnosis not present

## 2019-04-16 DIAGNOSIS — Z9049 Acquired absence of other specified parts of digestive tract: Secondary | ICD-10-CM | POA: Diagnosis not present

## 2019-04-16 DIAGNOSIS — Z808 Family history of malignant neoplasm of other organs or systems: Secondary | ICD-10-CM | POA: Insufficient documentation

## 2019-04-16 DIAGNOSIS — Z170 Estrogen receptor positive status [ER+]: Secondary | ICD-10-CM | POA: Diagnosis not present

## 2019-04-16 MED ORDER — ACETAMINOPHEN 325 MG PO TABS
ORAL_TABLET | ORAL | Status: AC
  Filled 2019-04-16: qty 2

## 2019-04-16 MED ORDER — ACETAMINOPHEN 325 MG PO TABS
650.0000 mg | ORAL_TABLET | Freq: Once | ORAL | Status: AC
  Administered 2019-04-16: 650 mg via ORAL

## 2019-04-16 MED ORDER — DIPHENHYDRAMINE HCL 25 MG PO CAPS
ORAL_CAPSULE | ORAL | Status: AC
  Filled 2019-04-16: qty 1

## 2019-04-16 MED ORDER — TRASTUZUMAB-HYALURONIDASE-OYSK 600-10000 MG-UNT/5ML ~~LOC~~ SOLN
600.0000 mg | Freq: Once | SUBCUTANEOUS | Status: AC
  Administered 2019-04-16: 15:00:00 600 mg via SUBCUTANEOUS
  Filled 2019-04-16: qty 5

## 2019-04-16 MED ORDER — DIPHENHYDRAMINE HCL 25 MG PO CAPS
50.0000 mg | ORAL_CAPSULE | Freq: Once | ORAL | Status: AC
  Administered 2019-04-16: 15:00:00 50 mg via ORAL

## 2019-04-16 NOTE — Assessment & Plan Note (Addendum)
03/09/2019: Right mastectomy Amber [name]): IDC, 2.6cm, grade 2, clear margins, 6 lymph nodes negative for carcinoma.,  ER 100%, PR 90%, HER-2 equivocal by IHC, FISH positive ratio 2.76, copy #6.2, Ki-67 15%   Treatment plan: 1. Adjuvant Herceptin every 3 weeks.    Subcutaneously starting 04/16/2019 2. Adjuvant antiestrogen therapy with anastrozole 1 mg daily starting 04/16/2019  Echo on 03/13/19 normal: EF 60-65% DEXA 12/2017 shows T score of -0.8 in left total femur (normal).   We reviewed the above.  She will start the anastrozole today.  She and I discussed bone health and that she will need an echo every 3 months.   Antwanette will return every 3 weeks for subcutaneous Herceptin.  We will see her with every other treatment.  She was recommended to continue with the appropriate pandemic precautions. She knows to call for any questions that may arise between now and her next appointment.  We are happy to see her sooner if needed.

## 2019-04-16 NOTE — Progress Notes (Signed)
[facility] Follow up:    Tonia Ghent, MD [place] [address]   DIAGNOSIS: Cancer Staging Malignant neoplasm of upper-inner quadrant of right breast in female, estrogen receptor positive ([place]) Staging form: Breast, AJCC 8th Edition - Clinical stage from 02/05/2019: Stage IB (cT2, cN0, cM0, G2, ER+, PR+, HER2+) - Signed by Nicholas Lose, MD on 02/18/2019   SUMMARY OF ONCOLOGIC HISTORY: Oncology History  Malignant neoplasm of upper-inner quadrant of right breast in female, estrogen receptor positive ([place])  02/13/2019 Initial Diagnosis   Routine screening mammogram detected 1.5cm right breast mass, 2.3cm on diagnostic mammogram, at the 1 o'clock position. Biopsy showed IDC, grade 2, HER-2 + by FISH, ER+ 100%, PR+ 90%, Ki67 15%.    03/19/2019 Surgery   Right mastectomy [name]): IDC, 2.6cm, grade 2, clear margins, 6 lymph nodes negative for carcinoma.    04/16/2019 -  Chemotherapy   The patient had trastuzumab-hyaluronidase-oysk (HERCEPTIN HYLECTA) 600-10000 MG-UNT/5ML chemo SQ injection 600 mg, 600 mg, Subcutaneous,  Once, 0 of 12 cycles  for chemotherapy treatment.     Genetic Testing   Negative genetic testing. No pathogenic variants identified on the [facility] STAT Breast cancer panel + Common Hereditary Cancers Panel. VUS in BRCA2 called c.5378A>G and a VUS in MUTYH called c.925C>T identified. The report date is 02/28/2019.   The STAT Breast cancer panel offered by [facility] includes sequencing and rearrangement analysis for the following 9 genes:  ATM, BRCA1, BRCA2, CDH1, CHEK2, PALB2, PTEN, STK11 and TP53.    The Common Hereditary Cancers Panel offered by [facility] includes sequencing and/or deletion duplication testing of the following 48 genes: APC, ATM, AXIN2, BARD1, BMPR1A, BRCA1, BRCA2, BRIP1, CDH1, CDKN2A (p14ARF), CDKN2A (p16INK4a), CKD4, CHEK2, CTNNA1, DICER1, EPCAM (Deletion/duplication testing only), GREM1 (promoter region  deletion/duplication testing only), KIT, MEN1, MLH1, MSH2, MSH3, MSH6, MUTYH, NBN, NF1, NHTL1, PALB2, PDGFRA, PMS2, POLD1, POLE, PTEN, RAD50, RAD51C, RAD51D, RNF43, SDHB, SDHC, SDHD, SMAD4, SMARCA4. STK11, TP53, TSC1, TSC2, and VHL.  The following genes were evaluated for sequence changes only: SDHA and HOXB13 c.251G>A variant only.     CURRENT THERAPY:  Herceptin/Anastrozole  INTERVAL HISTORY: Amber [name] 86 y.o. female returns for evaluation of her breast cancer.  She has not yet started taking Anastrozole.  She is planning on starting this today.  She is also due to receive subcutaneous Herceptin today.  She says her mastectomy site continues to heal well.  She is going to see Dr. [name] again on 04/30/2019.  Her most recent echocardiogram was on 03/13/2019 and showed an EF of 60-65%.     Patient Active Problem List   Diagnosis Date Noted  . Breast cancer, right ([place]) 03/19/2019  . Genetic testing 02/28/2019  . Family history of breast cancer   . Family history of colon cancer   . Family history of throat cancer   . Family history of bladder cancer   . Malignant neoplasm of upper-inner quadrant of right breast in female, estrogen receptor positive ([place]) 02/13/2019  . Medicare annual wellness visit, subsequent 10/08/2018  . Osteopenia 09/02/2015  . Advance care planning 09/02/2015  . TMJ syndrome 02/07/2012  . Fatigue 06/06/2011  . Benign hypertensive heart disease without heart failure 10/18/2010  . Pure hypercholesterolemia 06/22/2010  . Back pain 06/22/2010  . History of colon cancer 06/22/2010  . Stress incontinence, female 06/22/2010  . History of Clostridium difficile infection 06/22/2010    is allergic to ace inhibitors; escitalopram oxalate; lipitor [atorvastatin calcium];  zetia [ezetimibe]; and zocor [simvastatin - high dose].  MEDICAL HISTORY: Past Medical History:  Diagnosis Date  . Anemia   . Blood transfusion without reported diagnosis    during treatment  for colon CA  . Breast cancer ([place]) 2020   right breast IDC  . C. difficile colitis    history of  . Cancer [facility])    Colon, s/p partial colectomy, no chemo or radiation  . Diverticulitis   . Family history of bladder cancer   . Family history of breast cancer   . Family history of colon cancer   . Family history of throat cancer   . Hyperlipidemia   . Hypertension   . Urinary incontinence    stress incont.  . Urinary tract infection     SURGICAL HISTORY: Past Surgical History:  Procedure Laterality Date  . ABDOMINAL HYSTERECTOMY    . BLADDER SUSPENSION    . CARDIOVASCULAR STRESS TEST  11/02/2004   EF 76%  . CATARACT EXTRACTION    . COLON SURGERY  2010   colon cancer  . MASTECTOMY W/ SENTINEL NODE BIOPSY Right 03/19/2019   Procedure: RIGHT MASTECTOMY WITH RIGHT AXILLARY SENTINEL LYMPH NODE BIOPSY;  Surgeon: Rolm Bookbinder, MD;  Location: [facility];  Service: General;  Laterality: Right;    SOCIAL HISTORY: Social History   Socioeconomic History  . Marital status: Widowed    Spouse name: Not on file  . Number of children: Not on file  . Years of education: Not on file  . Highest education level: Not on file  Occupational History  . Not on file  Tobacco Use  . Smoking status: Never Smoker  . Smokeless tobacco: Never Used  Substance and Sexual Activity  . Alcohol use: No  . Drug use: No  . Sexual activity: Not Currently    Birth control/protection: Surgical  Other Topics Concern  . Not on file  Social History Narrative   Married 1956   1 son, local   Social Determinants of Health   Financial Resource Strain:   . Difficulty of Paying Living Expenses: Not on file  Food Insecurity:   . Worried About Charity fundraiser in the Last Year: Not on file  . Ran Out of Food in the Last Year: Not on file  Transportation Needs:   . Lack of Transportation (Medical): Not on file  . Lack of Transportation (Non-Medical): Not on file  Physical Activity:    . Days of Exercise per Week: Not on file  . Minutes of Exercise per Session: Not on file  Stress:   . Feeling of Stress : Not on file  Social Connections:   . Frequency of Communication with Friends and Family: Not on file  . Frequency of Social Gatherings with Friends and Family: Not on file  . Attends Religious Services: Not on file  . Active Member of Clubs or Organizations: Not on file  . Attends Archivist Meetings: Not on file  . Marital Status: Not on file  Intimate Partner Violence:   . Fear of Current or Ex-Partner: Not on file  . Emotionally Abused: Not on file  . Physically Abused: Not on file  . Sexually Abused: Not on file    FAMILY HISTORY: Family History  Problem Relation Age of Onset  . Cancer Mother        breast cancer  . Breast cancer Mother   . Cancer Father        colon cancer  .  Stroke Father   . Colon cancer Father   . Cancer Brother        bladder cancer  . Cancer Brother        throat cancer    Review of Systems  Constitutional: Positive for fatigue. Negative for appetite change, chills and fever.  HENT:   Negative for hearing loss, lump/mass, sore throat and trouble swallowing.   Eyes: Negative for eye problems and icterus.  Respiratory: Negative for chest tightness, cough, shortness of breath and wheezing.   Cardiovascular: Negative for chest pain, leg swelling and palpitations.  Gastrointestinal: Negative for abdominal distention, abdominal pain, constipation, diarrhea, nausea and vomiting.  Endocrine: Negative for hot flashes.  Genitourinary: Negative for difficulty urinating.   Musculoskeletal: Negative for arthralgias.  Skin: Negative for itching and rash.  Neurological: Negative for dizziness, extremity weakness, headaches and numbness.  Hematological: Negative for adenopathy. Does not bruise/bleed easily.  Psychiatric/Behavioral: Negative for depression. The patient is not nervous/anxious.       PHYSICAL  EXAMINATION  ECOG PERFORMANCE STATUS: 1 - Symptomatic but completely ambulatory  Vitals:   04/16/19 1313  BP: (!) 112/54  Pulse: 91  Resp: 18  Temp: 98.7 F (37.1 C)  SpO2: 97%    Physical Exam Constitutional:      General: She is not in acute distress.    Appearance: Normal appearance. She is not toxic-appearing.  HENT:     Head: Normocephalic and atraumatic.  Eyes:     General: No scleral icterus. Cardiovascular:     Rate and Rhythm: Normal rate and regular rhythm.     Pulses: Normal pulses.     Heart sounds: Normal heart sounds.  Pulmonary:     Effort: Pulmonary effort is normal.     Breath sounds: Normal breath sounds.  Abdominal:     General: Abdomen is flat. Bowel sounds are normal. There is no distension.     Palpations: Abdomen is soft.     Tenderness: There is no abdominal tenderness.  Musculoskeletal:        General: No swelling.     Cervical back: Neck supple.  Lymphadenopathy:     Cervical: No cervical adenopathy.  Skin:    General: Skin is warm and dry.     Capillary Refill: Capillary refill takes less than 2 seconds.     Findings: No rash.  Neurological:     General: No focal deficit present.     Mental Status: She is alert.  Psychiatric:        Mood and Affect: Mood normal.        Behavior: Behavior normal.     LABORATORY DATA:  CBC    Component Value Date/Time   WBC 8.3 09/28/2017 1301   RBC 4.55 09/28/2017 1301   HGB 14.0 09/28/2017 1301   HGB 12.7 07/11/2008 1534   HCT 40.3 09/28/2017 1301   HCT 37.7 07/11/2008 1534   PLT 298.0 09/28/2017 1301   PLT 276 07/11/2008 1534   MCV 88.6 09/28/2017 1301   MCV 82.9 07/11/2008 1534   MCH 30.4 12/29/2014 0931   MCHC 34.7 09/28/2017 1301   RDW 13.3 09/28/2017 1301   RDW 22.9 (H) 07/11/2008 1534   LYMPHSABS 2.9 09/28/2017 1301   LYMPHSABS 2.0 07/11/2008 1534   MONOABS 0.6 09/28/2017 1301   MONOABS 0.4 07/11/2008 1534   EOSABS 0.2 09/28/2017 1301   EOSABS 0.1 07/11/2008 1534   BASOSABS  0.0 09/28/2017 1301   BASOSABS 0.0 07/11/2008 1534    CMP  Component Value Date/Time   NA 138 09/26/2018 1139   K 3.8 09/26/2018 1139   CL 103 09/26/2018 1139   CO2 27 09/26/2018 1139   GLUCOSE 111 (H) 09/26/2018 1139   BUN 15 09/26/2018 1139   CREATININE 0.88 09/26/2018 1139   CREATININE 0.73 12/29/2014 0931   CALCIUM 9.5 09/26/2018 1139   PROT 7.0 09/26/2018 1139   ALBUMIN 4.1 09/26/2018 1139   AST 16 09/26/2018 1139   ALT 10 09/26/2018 1139   ALKPHOS 75 09/26/2018 1139   BILITOT 0.4 09/26/2018 1139   GFRNONAA >60 06/23/2008 0535   GFRAA  06/23/2008 0535    >60        The eGFR has been calculated using the MDRD equation. This calculation has not been validated in all clinical situations. eGFR's persistently <60 mL/min signify possible Chronic Kidney Disease.       ASSESSMENT and PLAN:   Malignant neoplasm of upper-inner quadrant of right breast in female, estrogen receptor positive ([place]) 03/09/2019: Right mastectomy [name]): IDC, 2.6cm, grade 2, clear margins, 6 lymph nodes negative for carcinoma.,  ER 100%, PR 90%, HER-2 equivocal by IHC, FISH positive ratio 2.76, copy #6.2, Ki-67 15%   Treatment plan: 1. Adjuvant Herceptin every 3 weeks.    Subcutaneously starting 04/16/2019 2. Adjuvant antiestrogen therapy with anastrozole 1 mg daily starting 04/16/2019  Echo on 03/13/19 normal: EF 60-65% DEXA 12/2017 shows T score of -0.8 in left total femur (normal).   We reviewed the above.  She will start the anastrozole today.  She and I discussed bone health and that she will need an echo every 3 months.   Kaylla will return every 3 weeks for subcutaneous Herceptin.  We will see her with every other treatment.  She was recommended to continue with the appropriate pandemic precautions. She knows to call for any questions that may arise between now and her next appointment.  We are happy to see her sooner if needed.     All questions were answered. The patient  knows to call the clinic with any problems, questions or concerns. We can certainly see the patient much sooner if necessary.  Total encounter time: 30 minutes*  This note was electronically signed.  Wilber Bihari, NP 04/16/19 2:00 PM Medical Oncology and Hematology [facility] [place], [place] [address] Tel. [phone]    Fax. [phone]  *Total Encounter Time as defined by the Centers for Medicare and Medicaid Services includes, in addition to the face-to-face time of a patient visit (documented in the note above) non-face-to-face time: obtaining and reviewing outside history, ordering and reviewing medications, tests or procedures, care coordination (communications with other health care professionals or caregivers) and documentation in the medical record.

## 2019-04-16 NOTE — Patient Instructions (Addendum)
[facility] Discharge Instructions for Patients Receiving Chemotherapy  Today you received the following Immunotherapy agent: Traztuzumab hyaluronidase  To help prevent nausea and vomiting after your treatment, we encourage you to take your nausea medication as directed by your MD.   If you develop nausea and vomiting that is not controlled by your nausea medication, call the clinic.   BELOW ARE SYMPTOMS THAT SHOULD BE REPORTED IMMEDIATELY:  *FEVER GREATER THAN 100.5 F  *CHILLS WITH OR WITHOUT FEVER  NAUSEA AND VOMITING THAT IS NOT CONTROLLED WITH YOUR NAUSEA MEDICATION  *UNUSUAL SHORTNESS OF BREATH  *UNUSUAL BRUISING OR BLEEDING  TENDERNESS IN MOUTH AND THROAT WITH OR WITHOUT PRESENCE OF ULCERS  *URINARY PROBLEMS  *BOWEL PROBLEMS  UNUSUAL RASH Items with * indicate a potential emergency and should be followed up as soon as possible.  Feel free to call the clinic should you have any questions or concerns. The clinic phone number is [phone].  Please show the [facility] at check-in to the Emergency Department and triage nurse.    Trastuzumab; Hyaluronidase injection What is this medicine? TRASTUZUMAB; HYALURONIDASE (tras TOO zoo mab / hye al ur ON i dase) is used to treat breast cancer and stomach cancer. Trastuzumab is a monoclonal antibody. Hyaluronidase is used to improve the effects of trastuzumab. This medicine may be used for other purposes; ask your health care provider or pharmacist if you have questions. COMMON BRAND NAME(S): HERCEPTIN HYLECTA What should I tell my health care provider before I take this medicine? They need to know if you have any of these conditions:  heart disease  heart failure  lung or breathing disease, like asthma  an unusual or allergic reaction to trastuzumab, or other medications, foods, dyes, or preservatives  pregnant or trying to get pregnant  breast-feeding How should I use this medicine? This  medicine is for injection under the skin. It is given by a health care professional in a hospital or clinic setting. Talk to your pediatrician regarding the use of this medicine in children. This medicine is not approved for use in children. Overdosage: If you think you have taken too much of this medicine contact a poison control center or emergency room at once. NOTE: This medicine is only for you. Do not share this medicine with others. What if I miss a dose? It is important not to miss a dose. Call your doctor or health care professional if you are unable to keep an appointment. What may interact with this medicine? This medicine may interact with the following medications:  certain types of chemotherapy, such as daunorubicin, doxorubicin, epirubicin, and idarubicin This list may not describe all possible interactions. Give your health care provider a list of all the medicines, herbs, non-prescription drugs, or dietary supplements you use. Also tell them if you smoke, drink alcohol, or use illegal drugs. Some items may interact with your medicine. What should I watch for while using this medicine? Visit your doctor for checks on your progress. Report any side effects. Continue your course of treatment even though you feel ill unless your doctor tells you to stop. Call your doctor or health care professional for advice if you get a fever, chills or sore throat, or other symptoms of a cold or flu. Do not treat yourself. Try to avoid being around people who are sick. You may experience fever, chills and shaking during your first infusion. These effects are usually mild and can be treated with other medicines. Report any  side effects during the infusion to your health care professional. Fever and chills usually do not happen with later infusions. Do not become pregnant while taking this medicine or for 7 months after stopping it. Women should inform their doctor if they wish to become pregnant or  think they might be pregnant. Women of child-bearing potential will need to have a negative pregnancy test before starting this medicine. There is a potential for serious side effects to an unborn child. Talk to your health care professional or pharmacist for more information. Do not breast-feed an infant while taking this medicine or for 7 months after stopping it. What side effects may I notice from receiving this medicine? Side effects that you should report to your doctor or health care professional as soon as possible:  allergic reactions like skin rash, itching or hives, swelling of the face, lips, or tongue  breathing problems  chest pain or palpitations  cough  fever  general ill feeling or flu-like symptoms  signs of worsening heart failure like breathing problems; swelling in your legs and feet Side effects that usually do not require medical attention (report these to your doctor or health care professional if they continue or are bothersome):  bone pain  changes in taste  diarrhea  joint pain  nausea/vomiting  unusually weak or tired  weight loss This list may not describe all possible side effects. Call your doctor for medical advice about side effects. You may report side effects to FDA at 1-800-FDA-1088. Where should I keep my medicine? This drug is given in a hospital or clinic and will not be stored at home. NOTE: This sheet is a summary. It may not cover all possible information. If you have questions about this medicine, talk to your doctor, pharmacist, or health care provider.  2020 Elsevier/Gold Standard (2017-05-05 21:54:17) Coronavirus (COVID-19) Are you at risk?  Are you at risk for the Coronavirus (COVID-19)?  To be considered HIGH RISK for Coronavirus (COVID-19), you have to meet the following criteria:  . Traveled to [place], [place], [place], [place] or [place]; or in the [place] to [place], [place], [place], or [place]; and have  fever, cough, and shortness of breath within the last 2 weeks of travel OR . Been in close contact with a person diagnosed with COVID-19 within the last 2 weeks and have fever, cough, and shortness of breath . IF YOU DO NOT MEET THESE CRITERIA, YOU ARE CONSIDERED LOW RISK FOR COVID-19.  What to do if you are HIGH RISK for COVID-19?  Marland Kitchen If you are having a medical emergency, call 911. . Seek medical care right away. Before you go to a doctor's office, urgent care or emergency department, call ahead and tell them about your recent travel, contact with someone diagnosed with COVID-19, and your symptoms. You should receive instructions from your physician's office regarding next steps of care.  . When you arrive at healthcare provider, tell the healthcare staff immediately you have returned from visiting [place], [place], [place], [place] or [place]; or traveled in the [place] to [place], [place], [place], or [place]; in the last two weeks or you have been in close contact with a person diagnosed with COVID-19 in the last 2 weeks.   . Tell the health care staff about your symptoms: fever, cough and shortness of breath. . After you have been seen by a medical provider, you will be either: o Tested for (COVID-19) and discharged home on  quarantine except to seek medical care if symptoms worsen, and asked to  - Stay home and avoid contact with others until you get your results (4-5 days)  - Avoid travel on public transportation if possible (such as bus, train, or airplane) or o Sent to the Emergency Department by EMS for evaluation, COVID-19 testing, and possible admission depending on your condition and test results.  What to do if you are LOW RISK for COVID-19?  Reduce your risk of any infection by using the same precautions used for avoiding the common cold or flu:  Marland Kitchen Wash your hands often with soap and warm water for at least 20 seconds.  If soap and water are not readily available, use an  alcohol-based hand sanitizer with at least 60% alcohol.  . If coughing or sneezing, cover your mouth and nose by coughing or sneezing into the elbow areas of your shirt or coat, into a tissue or into your sleeve (not your hands). . Avoid shaking hands with others and consider head nods or verbal greetings only. . Avoid touching your eyes, nose, or mouth with unwashed hands.  . Avoid close contact with people who are sick. . Avoid places or events with large numbers of people in one location, like concerts or sporting events. . Carefully consider travel plans you have or are making. . If you are planning any travel outside or inside the [place], visit the CDC's Travelers' Health webpage for the latest health notices. . If you have some symptoms but not all symptoms, continue to monitor at home and seek medical attention if your symptoms worsen. . If you are having a medical emergency, call 911.   [place] / e-Visit: [email or web address]         [facility]: [facility]: [id number]                   [facility]: [phone]

## 2019-04-18 ENCOUNTER — Encounter: Payer: Medicare HMO | Admitting: Physical Therapy

## 2019-04-19 ENCOUNTER — Other Ambulatory Visit: Payer: Self-pay | Admitting: Family Medicine

## 2019-04-19 DIAGNOSIS — E7800 Pure hypercholesterolemia, unspecified: Secondary | ICD-10-CM

## 2019-05-07 ENCOUNTER — Inpatient Hospital Stay: Payer: Medicare HMO | Attending: Hematology and Oncology

## 2019-05-07 ENCOUNTER — Other Ambulatory Visit: Payer: Self-pay

## 2019-05-07 VITALS — BP 148/78 | HR 84 | Temp 98.9°F | Resp 20

## 2019-05-07 DIAGNOSIS — Z170 Estrogen receptor positive status [ER+]: Secondary | ICD-10-CM | POA: Diagnosis not present

## 2019-05-07 DIAGNOSIS — Z5111 Encounter for antineoplastic chemotherapy: Secondary | ICD-10-CM | POA: Diagnosis not present

## 2019-05-07 DIAGNOSIS — Z9011 Acquired absence of right breast and nipple: Secondary | ICD-10-CM | POA: Insufficient documentation

## 2019-05-07 DIAGNOSIS — Z79811 Long term (current) use of aromatase inhibitors: Secondary | ICD-10-CM | POA: Insufficient documentation

## 2019-05-07 DIAGNOSIS — C50211 Malignant neoplasm of upper-inner quadrant of right female breast: Secondary | ICD-10-CM | POA: Diagnosis not present

## 2019-05-07 MED ORDER — DIPHENHYDRAMINE HCL 25 MG PO CAPS
50.0000 mg | ORAL_CAPSULE | Freq: Once | ORAL | Status: AC
  Administered 2019-05-07: 50 mg via ORAL

## 2019-05-07 MED ORDER — TRASTUZUMAB-HYALURONIDASE-OYSK 600-10000 MG-UNT/5ML ~~LOC~~ SOLN
600.0000 mg | Freq: Once | SUBCUTANEOUS | Status: AC
  Administered 2019-05-07: 600 mg via SUBCUTANEOUS
  Filled 2019-05-07: qty 5

## 2019-05-07 MED ORDER — ACETAMINOPHEN 325 MG PO TABS
650.0000 mg | ORAL_TABLET | Freq: Once | ORAL | Status: AC
  Administered 2019-05-07: 650 mg via ORAL

## 2019-05-07 MED ORDER — DIPHENHYDRAMINE HCL 25 MG PO CAPS
ORAL_CAPSULE | ORAL | Status: AC
  Filled 2019-05-07: qty 2

## 2019-05-07 MED ORDER — ACETAMINOPHEN 325 MG PO TABS
ORAL_TABLET | ORAL | Status: AC
  Filled 2019-05-07: qty 2

## 2019-05-22 NOTE — Progress Notes (Signed)
Pharmacist Chemotherapy Monitoring - Follow Up Assessment    I verify that I have reviewed each item in the below checklist:  . Regimen for the patient is scheduled for the appropriate day and plan matches scheduled date. Marland Kitchen Appropriate non-routine labs are ordered dependent on drug ordered. . If applicable, additional medications reviewed and ordered per protocol based on lifetime cumulative doses and/or treatment regimen.   Plan for follow-up and/or issues identified: No . I-vent associated with next due treatment: No . MD and/or nursing notified: No  Amber [name] D 05/22/2019 9:46 AM

## 2019-05-28 ENCOUNTER — Inpatient Hospital Stay (HOSPITAL_BASED_OUTPATIENT_CLINIC_OR_DEPARTMENT_OTHER): Payer: Medicare HMO | Admitting: Adult Health

## 2019-05-28 ENCOUNTER — Other Ambulatory Visit: Payer: Self-pay

## 2019-05-28 ENCOUNTER — Encounter: Payer: Self-pay | Admitting: Hematology and Oncology

## 2019-05-28 ENCOUNTER — Inpatient Hospital Stay: Payer: Medicare HMO

## 2019-05-28 ENCOUNTER — Encounter: Payer: Self-pay | Admitting: Oncology

## 2019-05-28 ENCOUNTER — Encounter: Payer: Self-pay | Admitting: Adult Health

## 2019-05-28 VITALS — BP 138/82 | HR 94 | Temp 98.7°F | Resp 18 | Ht 63.0 in | Wt 134.4 lb

## 2019-05-28 DIAGNOSIS — C50211 Malignant neoplasm of upper-inner quadrant of right female breast: Secondary | ICD-10-CM

## 2019-05-28 DIAGNOSIS — Z5111 Encounter for antineoplastic chemotherapy: Secondary | ICD-10-CM | POA: Diagnosis not present

## 2019-05-28 DIAGNOSIS — Z170 Estrogen receptor positive status [ER+]: Secondary | ICD-10-CM

## 2019-05-28 MED ORDER — ACETAMINOPHEN 325 MG PO TABS
ORAL_TABLET | ORAL | Status: AC
  Filled 2019-05-28: qty 2

## 2019-05-28 MED ORDER — DIPHENHYDRAMINE HCL 25 MG PO CAPS
25.0000 mg | ORAL_CAPSULE | Freq: Once | ORAL | Status: AC
  Administered 2019-05-28: 25 mg via ORAL

## 2019-05-28 MED ORDER — ACETAMINOPHEN 325 MG PO TABS
650.0000 mg | ORAL_TABLET | Freq: Once | ORAL | Status: AC
  Administered 2019-05-28: 650 mg via ORAL

## 2019-05-28 MED ORDER — DIPHENHYDRAMINE HCL 25 MG PO CAPS
ORAL_CAPSULE | ORAL | Status: AC
  Filled 2019-05-28: qty 2

## 2019-05-28 MED ORDER — TRASTUZUMAB-HYALURONIDASE-OYSK 600-10000 MG-UNT/5ML ~~LOC~~ SOLN
600.0000 mg | Freq: Once | SUBCUTANEOUS | Status: AC
  Administered 2019-05-28: 600 mg via SUBCUTANEOUS
  Filled 2019-05-28: qty 5

## 2019-05-28 NOTE — Assessment & Plan Note (Addendum)
03/09/2019: Right mastectomy [name]): IDC, 2.6cm, grade 2, clear margins, 6 lymph nodes negative for carcinoma.,  ER 100%, PR 90%, HER-2 equivocal by IHC, FISH positive ratio 2.76, copy #6.2, Ki-67 15%   Treatment plan: 1. Adjuvant Herceptin every 3 weeks.    Subcutaneously starting 04/16/2019 2. Adjuvant antiestrogen therapy with anastrozole 1 mg daily starting 04/16/2019  Echo on 03/13/19 normal: EF 60-65% DEXA 12/2017 shows T score of -0.8 in left total femur (normal).   Amber [name] is doing well and is tolerating the Herceptin and Anastrozole well.  She will continue on this treatment.  I sent a message to [facility] our pharmacist to see if there are any other options for treatment that may not have such a high out of pocket cost.  Selene notes that she is happy to receive the treatment IV if needed.    [name] will return every three weeks.  She was recommended to continue with the appropriate pandemic precautions. She knows to call for any questions that may arise between now and her next appointment.  We are happy to see her sooner if needed.  Total encounter time: 20 minutes*

## 2019-05-28 NOTE — Progress Notes (Signed)
[facility] Follow up:    Amber Ghent, MD [place] [address]   DIAGNOSIS: Cancer Staging Malignant neoplasm of upper-inner quadrant of right breast in female, estrogen receptor positive ([place]) Staging form: Breast, AJCC 8th Edition - Clinical stage from 02/05/2019: Stage IB (cT2, cN0, cM0, G2, ER+, PR+, HER2+) - Signed by Nicholas Lose, MD on 02/18/2019   SUMMARY OF ONCOLOGIC HISTORY: Oncology History  Malignant neoplasm of upper-inner quadrant of right breast in female, estrogen receptor positive ([place])  02/13/2019 Initial Diagnosis   Routine screening mammogram detected 1.5cm right breast mass, 2.3cm on diagnostic mammogram, at the 1 o'clock position. Biopsy showed IDC, grade 2, HER-2 + by FISH, ER+ 100%, PR+ 90%, Ki67 15%.    03/19/2019 Surgery   Right mastectomy [name]): IDC, 2.6cm, grade 2, clear margins, 6 lymph nodes negative for carcinoma.    04/16/2019 -  Chemotherapy   The patient had trastuzumab-hyaluronidase-oysk (HERCEPTIN HYLECTA) 600-10000 MG-UNT/5ML chemo SQ injection 600 mg, 600 mg, Subcutaneous,  Once, 2 of 12 cycles Administration: 600 mg (04/16/2019), 600 mg (05/07/2019)  for chemotherapy treatment.     Genetic Testing   Negative genetic testing. No pathogenic variants identified on the [facility] STAT Breast cancer panel + Common Hereditary Cancers Panel. VUS in BRCA2 called c.5378A>G and a VUS in MUTYH called c.925C>T identified. The report date is 02/28/2019.   The STAT Breast cancer panel offered by [facility] includes sequencing and rearrangement analysis for the following 9 genes:  ATM, BRCA1, BRCA2, CDH1, CHEK2, PALB2, PTEN, STK11 and TP53.    The Common Hereditary Cancers Panel offered by [facility] includes sequencing and/or deletion duplication testing of the following 48 genes: APC, ATM, AXIN2, BARD1, BMPR1A, BRCA1, BRCA2, BRIP1, CDH1, CDKN2A (p14ARF), CDKN2A (p16INK4a), CKD4, CHEK2, CTNNA1, DICER1, EPCAM  (Deletion/duplication testing only), GREM1 (promoter region deletion/duplication testing only), KIT, MEN1, MLH1, MSH2, MSH3, MSH6, MUTYH, NBN, NF1, NHTL1, PALB2, PDGFRA, PMS2, POLD1, POLE, PTEN, RAD50, RAD51C, RAD51D, RNF43, SDHB, SDHC, SDHD, SMAD4, SMARCA4. STK11, TP53, TSC1, TSC2, and VHL.  The following genes were evaluated for sequence changes only: SDHA and HOXB13 c.251G>A variant only.     CURRENT THERAPY: Herceptin, Anastrozole  INTERVAL HISTORY: Amber [name] 86 y.o. female returns for Herceptin.  She receives this every three weeks.  She notes that her responsible portion is $900.  She is also taking anastrozole daily.  She has some tolerable hot flashes.  No increase in arthralgias or vaginal dryness.   Patient Active Problem List   Diagnosis Date Noted  . Breast cancer, right ([place]) 03/19/2019  . Genetic testing 02/28/2019  . Family history of breast cancer   . Family history of colon cancer   . Family history of throat cancer   . Family history of bladder cancer   . Malignant neoplasm of upper-inner quadrant of right breast in female, estrogen receptor positive ([place]) 02/13/2019  . Medicare annual wellness visit, subsequent 10/08/2018  . Osteopenia 09/02/2015  . Advance care planning 09/02/2015  . TMJ syndrome 02/07/2012  . Fatigue 06/06/2011  . Benign hypertensive heart disease without heart failure 10/18/2010  . Pure hypercholesterolemia 06/22/2010  . Back pain 06/22/2010  . History of colon cancer 06/22/2010  . Stress incontinence, female 06/22/2010  . History of Clostridium difficile infection 06/22/2010    is allergic to ace inhibitors; escitalopram oxalate; lipitor [atorvastatin calcium]; zetia [ezetimibe]; and zocor [simvastatin - high dose].  MEDICAL HISTORY: Past Medical History:  Diagnosis Date  . Anemia   .  Blood transfusion without reported diagnosis    during treatment for colon CA  . Breast cancer ([place]) 2020   right breast IDC  . C. difficile  colitis    history of  . Cancer [facility])    Colon, s/p partial colectomy, no chemo or radiation  . Diverticulitis   . Family history of bladder cancer   . Family history of breast cancer   . Family history of colon cancer   . Family history of throat cancer   . Hyperlipidemia   . Hypertension   . Urinary incontinence    stress incont.  . Urinary tract infection     SURGICAL HISTORY: Past Surgical History:  Procedure Laterality Date  . ABDOMINAL HYSTERECTOMY    . BLADDER SUSPENSION    . CARDIOVASCULAR STRESS TEST  11/02/2004   EF 76%  . CATARACT EXTRACTION    . COLON SURGERY  2010   colon cancer  . MASTECTOMY W/ SENTINEL NODE BIOPSY Right 03/19/2019   Procedure: RIGHT MASTECTOMY WITH RIGHT AXILLARY SENTINEL LYMPH NODE BIOPSY;  Surgeon: Rolm Bookbinder, MD;  Location: [facility];  Service: General;  Laterality: Right;    SOCIAL HISTORY: Social History   Socioeconomic History  . Marital status: Widowed    Spouse name: Not on file  . Number of children: Not on file  . Years of education: Not on file  . Highest education level: Not on file  Occupational History  . Not on file  Tobacco Use  . Smoking status: Never Smoker  . Smokeless tobacco: Never Used  Substance and Sexual Activity  . Alcohol use: No  . Drug use: No  . Sexual activity: Not Currently    Birth control/protection: Surgical  Other Topics Concern  . Not on file  Social History Narrative   Married 1956   1 son, local   Social Determinants of Health   Financial Resource Strain:   . Difficulty of Paying Living Expenses:   Food Insecurity:   . Worried About Charity fundraiser in the Last Year:   . Arboriculturist in the Last Year:   Transportation Needs:   . Film/video editor (Medical):   Marland Kitchen Lack of Transportation (Non-Medical):   Physical Activity:   . Days of Exercise per Week:   . Minutes of Exercise per Session:   Stress:   . Feeling of Stress :   Social Connections:   .  Frequency of Communication with Friends and Family:   . Frequency of Social Gatherings with Friends and Family:   . Attends Religious Services:   . Active Member of Clubs or Organizations:   . Attends Archivist Meetings:   Marland Kitchen Marital Status:   Intimate Partner Violence:   . Fear of Current or Ex-Partner:   . Emotionally Abused:   Marland Kitchen Physically Abused:   . Sexually Abused:     FAMILY HISTORY: Family History  Problem Relation Age of Onset  . Cancer Mother        breast cancer  . Breast cancer Mother   . Cancer Father        colon cancer  . Stroke Father   . Colon cancer Father   . Cancer Brother        bladder cancer  . Cancer Brother        throat cancer    Review of Systems  Constitutional: Positive for fatigue. Negative for appetite change, chills, fever and unexpected weight change.  HENT:   Negative for hearing loss, lump/mass and trouble swallowing.   Eyes: Negative for eye problems and icterus.  Respiratory: Negative for chest tightness, cough and shortness of breath.   Cardiovascular: Negative for chest pain, leg swelling and palpitations.  Gastrointestinal: Negative for abdominal distention, abdominal pain, constipation, diarrhea, nausea and vomiting.  Endocrine: Positive for hot flashes.  Musculoskeletal: Negative for arthralgias.  Skin: Negative for itching and rash.  Neurological: Negative for dizziness, extremity weakness, headaches and numbness.  Hematological: Negative for adenopathy. Does not bruise/bleed easily.  Psychiatric/Behavioral: Negative for depression. The patient is not nervous/anxious.       PHYSICAL EXAMINATION  ECOG PERFORMANCE STATUS: 1 - Symptomatic but completely ambulatory  Vitals:   05/28/19 1312  BP: 138/82  Pulse: 94  Resp: 18  Temp: 98.7 F (37.1 C)  SpO2: 97%    Physical Exam Constitutional:      General: She is not in acute distress.    Appearance: Normal appearance. She is not toxic-appearing.  HENT:      Head: Normocephalic and atraumatic.  Eyes:     General: No scleral icterus. Cardiovascular:     Rate and Rhythm: Normal rate and regular rhythm.  Pulmonary:     Effort: Pulmonary effort is normal.     Breath sounds: Normal breath sounds.  Abdominal:     General: Abdomen is flat. Bowel sounds are normal.     Palpations: Abdomen is soft.  Musculoskeletal:        General: No swelling.     Cervical back: Neck supple.  Lymphadenopathy:     Cervical: No cervical adenopathy.  Skin:    General: Skin is warm and dry.     Findings: No rash.  Neurological:     General: No focal deficit present.     Mental Status: She is alert.  Psychiatric:        Mood and Affect: Mood normal.        Behavior: Behavior normal.     LABORATORY DATA:  CBC    Component Value Date/Time   WBC 8.3 09/28/2017 1301   RBC 4.55 09/28/2017 1301   HGB 14.0 09/28/2017 1301   HGB 12.7 07/11/2008 1534   HCT 40.3 09/28/2017 1301   HCT 37.7 07/11/2008 1534   PLT 298.0 09/28/2017 1301   PLT 276 07/11/2008 1534   MCV 88.6 09/28/2017 1301   MCV 82.9 07/11/2008 1534   MCH 30.4 12/29/2014 0931   MCHC 34.7 09/28/2017 1301   RDW 13.3 09/28/2017 1301   RDW 22.9 (H) 07/11/2008 1534   LYMPHSABS 2.9 09/28/2017 1301   LYMPHSABS 2.0 07/11/2008 1534   MONOABS 0.6 09/28/2017 1301   MONOABS 0.4 07/11/2008 1534   EOSABS 0.2 09/28/2017 1301   EOSABS 0.1 07/11/2008 1534   BASOSABS 0.0 09/28/2017 1301   BASOSABS 0.0 07/11/2008 1534    CMP     Component Value Date/Time   NA 138 09/26/2018 1139   K 3.8 09/26/2018 1139   CL 103 09/26/2018 1139   CO2 27 09/26/2018 1139   GLUCOSE 111 (H) 09/26/2018 1139   BUN 15 09/26/2018 1139   CREATININE 0.88 09/26/2018 1139   CREATININE 0.73 12/29/2014 0931   CALCIUM 9.5 09/26/2018 1139   PROT 7.0 09/26/2018 1139   ALBUMIN 4.1 09/26/2018 1139   AST 16 09/26/2018 1139   ALT 10 09/26/2018 1139   ALKPHOS 75 09/26/2018 1139   BILITOT 0.4 09/26/2018 1139   GFRNONAA >60 06/23/2008  0535   GFRAA  06/23/2008 0535    >60        The eGFR has been calculated using the MDRD equation. This calculation has not been validated in all clinical situations. eGFR's persistently <60 mL/min signify possible Chronic Kidney Disease.        ASSESSMENT and THERAPY PLAN:   Malignant neoplasm of upper-inner quadrant of right breast in female, estrogen receptor positive ([place]) 03/09/2019: Right mastectomy [name]): IDC, 2.6cm, grade 2, clear margins, 6 lymph nodes negative for carcinoma.,  ER 100%, PR 90%, HER-2 equivocal by IHC, FISH positive ratio 2.76, copy #6.2, Ki-67 15%   Treatment plan: 1. Adjuvant Herceptin every 3 weeks.    Subcutaneously starting 04/16/2019 2. Adjuvant antiestrogen therapy with anastrozole 1 mg daily starting 04/16/2019  Echo on 03/13/19 normal: EF 60-65% DEXA 12/2017 shows T score of -0.8 in left total femur (normal).   Amber [name] is doing well and is tolerating the Herceptin and Anastrozole well.  She will continue on this treatment.  I sent a message to [facility], our pharmacist to see if there are any other options for treatment that may not have such a high out of pocket cost.  Amber [name] notes that she is happy to receive the treatment IV if needed.    Amber [name] will return every three weeks.  She was recommended to continue with the appropriate pandemic precautions. She knows to call for any questions that may arise between now and her next appointment.  We are happy to see her sooner if needed.  Total encounter time: 20 minutes*       Wilber Bihari, NP 05/28/19 2:06 PM Medical Oncology and Hematology [facility] [place],  [address] Tel. [phone]    Fax. [phone]  *Total Encounter Time as defined by the Centers for Medicare and Medicaid Services includes, in addition to the face-to-face time of a patient visit (documented in the note above) non-face-to-face time: obtaining and reviewing outside history,  ordering and reviewing medications, tests or procedures, care coordination (communications with other health care professionals or caregivers) and documentation in the medical record.

## 2019-05-28 NOTE — Progress Notes (Signed)
Met with patient whom had concerns regarding cost of injection.  Reviewed account and printed the EOB and explained the coinsurance which was amount that she was concerned with. Provided her with a copy of the breakdown.   Also advised I reached out to [facility] to ask about assistance due to excessive OOP and if so, pharmacy would reach out to her regarding this concern. Patient does not qualify for copay assistance through company, due to insurance type.  Gave her my card for any additional financial questions or concerns.

## 2019-05-28 NOTE — Patient Instructions (Signed)
[facility] Discharge Instructions for Patients Receiving Chemotherapy  Today you received the following Immunotherapy agent: Traztuzumab hyaluronidase  To help prevent nausea and vomiting after your treatment, we encourage you to take your nausea medication as directed by your MD.   If you develop nausea and vomiting that is not controlled by your nausea medication, call the clinic.   BELOW ARE SYMPTOMS THAT SHOULD BE REPORTED IMMEDIATELY:  *FEVER GREATER THAN 100.5 F  *CHILLS WITH OR WITHOUT FEVER  NAUSEA AND VOMITING THAT IS NOT CONTROLLED WITH YOUR NAUSEA MEDICATION  *UNUSUAL SHORTNESS OF BREATH  *UNUSUAL BRUISING OR BLEEDING  TENDERNESS IN MOUTH AND THROAT WITH OR WITHOUT PRESENCE OF ULCERS  *URINARY PROBLEMS  *BOWEL PROBLEMS  UNUSUAL RASH Items with * indicate a potential emergency and should be followed up as soon as possible.  Feel free to call the clinic should you have any questions or concerns. The clinic phone number is [phone].  Please show the [place] at check-in to the Emergency Department and triage nurse.    Trastuzumab; Hyaluronidase injection What is this medicine? TRASTUZUMAB; HYALURONIDASE (tras TOO zoo mab / hye al ur ON i dase) is used to treat breast cancer and stomach cancer. Trastuzumab is a monoclonal antibody. Hyaluronidase is used to improve the effects of trastuzumab. This medicine may be used for other purposes; ask your health care provider or pharmacist if you have questions. COMMON BRAND NAME(S): HERCEPTIN HYLECTA What should I tell my health care provider before I take this medicine? They need to know if you have any of these conditions:  heart disease  heart failure  lung or breathing disease, like asthma  an unusual or allergic reaction to trastuzumab, or other medications, foods, dyes, or preservatives  pregnant or trying to get pregnant  breast-feeding How should I use this medicine? This  medicine is for injection under the skin. It is given by a health care professional in a hospital or clinic setting. Talk to your pediatrician regarding the use of this medicine in children. This medicine is not approved for use in children. Overdosage: If you think you have taken too much of this medicine contact a poison control center or emergency room at once. NOTE: This medicine is only for you. Do not share this medicine with others. What if I miss a dose? It is important not to miss a dose. Call your doctor or health care professional if you are unable to keep an appointment. What may interact with this medicine? This medicine may interact with the following medications:  certain types of chemotherapy, such as daunorubicin, doxorubicin, epirubicin, and idarubicin This list may not describe all possible interactions. Give your health care provider a list of all the medicines, herbs, non-prescription drugs, or dietary supplements you use. Also tell them if you smoke, drink alcohol, or use illegal drugs. Some items may interact with your medicine. What should I watch for while using this medicine? Visit your doctor for checks on your progress. Report any side effects. Continue your course of treatment even though you feel ill unless your doctor tells you to stop. Call your doctor or health care professional for advice if you get a fever, chills or sore throat, or other symptoms of a cold or flu. Do not treat yourself. Try to avoid being around people who are sick. You may experience fever, chills and shaking during your first infusion. These effects are usually mild and can be treated with other medicines. Report any  side effects during the infusion to your health care professional. Fever and chills usually do not happen with later infusions. Do not become pregnant while taking this medicine or for 7 months after stopping it. Women should inform their doctor if they wish to become pregnant or  think they might be pregnant. Women of child-bearing potential will need to have a negative pregnancy test before starting this medicine. There is a potential for serious side effects to an unborn child. Talk to your health care professional or pharmacist for more information. Do not breast-feed an infant while taking this medicine or for 7 months after stopping it. What side effects may I notice from receiving this medicine? Side effects that you should report to your doctor or health care professional as soon as possible:  allergic reactions like skin rash, itching or hives, swelling of the face, lips, or tongue  breathing problems  chest pain or palpitations  cough  fever  general ill feeling or flu-like symptoms  signs of worsening heart failure like breathing problems; swelling in your legs and feet Side effects that usually do not require medical attention (report these to your doctor or health care professional if they continue or are bothersome):  bone pain  changes in taste  diarrhea  joint pain  nausea/vomiting  unusually weak or tired  weight loss This list may not describe all possible side effects. Call your doctor for medical advice about side effects. You may report side effects to FDA at 1-800-FDA-1088. Where should I keep my medicine? This drug is given in a hospital or clinic and will not be stored at home. NOTE: This sheet is a summary. It may not cover all possible information. If you have questions about this medicine, talk to your doctor, pharmacist, or health care provider.  2020 Elsevier/Gold Standard (2017-05-05 21:54:17) Coronavirus (COVID-19) Are you at risk?  Are you at risk for the Coronavirus (COVID-19)?  To be considered HIGH RISK for Coronavirus (COVID-19), you have to meet the following criteria:  . Traveled to [place], [place], [place], [place] or [place]; or in the [place] to [place], [place], [place], or [place]; and have  fever, cough, and shortness of breath within the last 2 weeks of travel OR . Been in close contact with a person diagnosed with COVID-19 within the last 2 weeks and have fever, cough, and shortness of breath . IF YOU DO NOT MEET THESE CRITERIA, YOU ARE CONSIDERED LOW RISK FOR COVID-19.  What to do if you are HIGH RISK for COVID-19?  Marland Kitchen If you are having a medical emergency, call 911. . Seek medical care right away. Before you go to a doctor's office, urgent care or emergency department, call ahead and tell them about your recent travel, contact with someone diagnosed with COVID-19, and your symptoms. You should receive instructions from your physician's office regarding next steps of care.  . When you arrive at healthcare provider, tell the healthcare staff immediately you have returned from visiting [place], [place], [place], [place] or [place]; or traveled in the [place] to [place], [place], [place], or [place]; in the last two weeks or you have been in close contact with a person diagnosed with COVID-19 in the last 2 weeks.   . Tell the health care staff about your symptoms: fever, cough and shortness of breath. . After you have been seen by a medical provider, you will be either: o Tested for (COVID-19) and discharged home on  quarantine except to seek medical care if symptoms worsen, and asked to  - Stay home and avoid contact with others until you get your results (4-5 days)  - Avoid travel on public transportation if possible (such as bus, train, or airplane) or o Sent to the Emergency Department by EMS for evaluation, COVID-19 testing, and possible admission depending on your condition and test results.  What to do if you are LOW RISK for COVID-19?  Reduce your risk of any infection by using the same precautions used for avoiding the common cold or flu:  Marland Kitchen Wash your hands often with soap and warm water for at least 20 seconds.  If soap and water are not readily available, use an  alcohol-based hand sanitizer with at least 60% alcohol.  . If coughing or sneezing, cover your mouth and nose by coughing or sneezing into the elbow areas of your shirt or coat, into a tissue or into your sleeve (not your hands). . Avoid shaking hands with others and consider head nods or verbal greetings only. . Avoid touching your eyes, nose, or mouth with unwashed hands.  . Avoid close contact with people who are sick. . Avoid places or events with large numbers of people in one location, like concerts or sporting events. . Carefully consider travel plans you have or are making. . If you are planning any travel outside or inside the [place], visit the CDC's Travelers' Health webpage for the latest health notices. . If you have some symptoms but not all symptoms, continue to monitor at home and seek medical attention if your symptoms worsen. . If you are having a medical emergency, call 911.   [place] / e-Visit: [email or web address]         [facility]: [facility]: [id number]                   [facility]: [phone]

## 2019-05-29 ENCOUNTER — Telehealth: Payer: Self-pay | Admitting: Adult Health

## 2019-05-29 NOTE — Telephone Encounter (Signed)
No 3/30 los. No changes made to pt's schedule.

## 2019-06-05 ENCOUNTER — Ambulatory Visit (HOSPITAL_COMMUNITY)
Admission: RE | Admit: 2019-06-05 | Discharge: 2019-06-05 | Disposition: A | Discharge status: Home / Self Care | Payer: Medicare HMO | Source: Ambulatory Visit | Attending: Adult Health | Admitting: Adult Health

## 2019-06-05 ENCOUNTER — Other Ambulatory Visit: Payer: Self-pay

## 2019-06-05 DIAGNOSIS — Z170 Estrogen receptor positive status [ER+]: Secondary | ICD-10-CM

## 2019-06-05 DIAGNOSIS — C50211 Malignant neoplasm of upper-inner quadrant of right female breast: Secondary | ICD-10-CM

## 2019-06-05 DIAGNOSIS — I517 Cardiomegaly: Secondary | ICD-10-CM | POA: Insufficient documentation

## 2019-06-05 DIAGNOSIS — E785 Hyperlipidemia, unspecified: Secondary | ICD-10-CM | POA: Insufficient documentation

## 2019-06-05 NOTE — Progress Notes (Signed)
  Echocardiogram 2D Echocardiogram has been performed.  Amber [name] 06/05/2019, 10:36 AM

## 2019-06-12 NOTE — Progress Notes (Signed)
Pharmacist Chemotherapy Monitoring - Follow Up Assessment    I verify that I have reviewed each item in the below checklist:  . Regimen for the patient is scheduled for the appropriate day and plan matches scheduled date. Marland Kitchen Appropriate non-routine labs are ordered dependent on drug ordered. . If applicable, additional medications reviewed and ordered per protocol based on lifetime cumulative doses and/or treatment regimen.   Plan for follow-up and/or issues identified: Yes . I-vent associated with next due treatment: Yes . MD and/or nursing notified: No   Kennith Center, Pharm.D., CPP 06/12/2019@9 :09 AM

## 2019-06-13 ENCOUNTER — Encounter: Payer: Self-pay | Admitting: Pharmacy Technician

## 2019-06-13 NOTE — Progress Notes (Signed)
Patient has been approved and is aware of  drug assistance by [place] for Herceptin Hylecta. The enrollment period is from 06/12/19- indefinitely based on EOOP. First DOS covered is 06/18/19.

## 2019-06-18 ENCOUNTER — Other Ambulatory Visit: Payer: Self-pay | Admitting: Hematology and Oncology

## 2019-06-18 ENCOUNTER — Inpatient Hospital Stay: Payer: Medicare HMO | Attending: Hematology and Oncology

## 2019-06-18 ENCOUNTER — Other Ambulatory Visit: Payer: Self-pay

## 2019-06-18 ENCOUNTER — Encounter: Payer: Self-pay | Admitting: *Deleted

## 2019-06-18 VITALS — BP 144/81 | HR 88 | Temp 98.9°F | Resp 17

## 2019-06-18 DIAGNOSIS — Z79811 Long term (current) use of aromatase inhibitors: Secondary | ICD-10-CM | POA: Insufficient documentation

## 2019-06-18 DIAGNOSIS — Z170 Estrogen receptor positive status [ER+]: Secondary | ICD-10-CM | POA: Insufficient documentation

## 2019-06-18 DIAGNOSIS — Z5112 Encounter for antineoplastic immunotherapy: Secondary | ICD-10-CM | POA: Diagnosis present

## 2019-06-18 DIAGNOSIS — C50211 Malignant neoplasm of upper-inner quadrant of right female breast: Secondary | ICD-10-CM | POA: Diagnosis not present

## 2019-06-18 MED ORDER — ACETAMINOPHEN 325 MG PO TABS
650.0000 mg | ORAL_TABLET | Freq: Once | ORAL | Status: AC
  Administered 2019-06-18: 650 mg via ORAL

## 2019-06-18 MED ORDER — TRASTUZUMAB-HYALURONIDASE-OYSK 600-10000 MG-UNT/5ML ~~LOC~~ SOLN
600.0000 mg | Freq: Once | SUBCUTANEOUS | Status: AC
  Administered 2019-06-18: 600 mg via SUBCUTANEOUS
  Filled 2019-06-18: qty 5

## 2019-06-18 MED ORDER — ACETAMINOPHEN 325 MG PO TABS
ORAL_TABLET | ORAL | Status: AC
  Filled 2019-06-18: qty 2

## 2019-06-18 MED ORDER — DIPHENHYDRAMINE HCL 25 MG PO CAPS
ORAL_CAPSULE | ORAL | Status: AC
  Filled 2019-06-18: qty 1

## 2019-06-18 MED ORDER — DIPHENHYDRAMINE HCL 25 MG PO CAPS
25.0000 mg | ORAL_CAPSULE | Freq: Once | ORAL | Status: AC
  Administered 2019-06-18: 25 mg via ORAL

## 2019-06-18 NOTE — Patient Instructions (Signed)
[facility] Discharge Instructions for Patients Receiving Chemotherapy  Today you received the following chemotherapy agents Hylecta  To help prevent nausea and vomiting after your treatment, we encourage you to take your nausea medication as directed   If you develop nausea and vomiting that is not controlled by your nausea medication, call the clinic.   BELOW ARE SYMPTOMS THAT SHOULD BE REPORTED IMMEDIATELY:  *FEVER GREATER THAN 100.5 F  *CHILLS WITH OR WITHOUT FEVER  NAUSEA AND VOMITING THAT IS NOT CONTROLLED WITH YOUR NAUSEA MEDICATION  *UNUSUAL SHORTNESS OF BREATH  *UNUSUAL BRUISING OR BLEEDING  TENDERNESS IN MOUTH AND THROAT WITH OR WITHOUT PRESENCE OF ULCERS  *URINARY PROBLEMS  *BOWEL PROBLEMS  UNUSUAL RASH Items with * indicate a potential emergency and should be followed up as soon as possible.  Feel free to call the clinic should you have any questions or concerns. The clinic phone number is [phone].  Please show the CHEMO ALERT CARD at check-in to the Emergency Department and triage nurse.   

## 2019-07-03 NOTE — Progress Notes (Signed)
Pharmacist Chemotherapy Monitoring - Follow Up Assessment    I verify that I have reviewed each item in the below checklist:  . Regimen for the patient is scheduled for the appropriate day and plan matches scheduled date. Marland Kitchen Appropriate non-routine labs are ordered dependent on drug ordered. . If applicable, additional medications reviewed and ordered per protocol based on lifetime cumulative doses and/or treatment regimen.   Plan for follow-up and/or issues identified: No . I-vent associated with next due treatment: No . MD and/or nursing notified: No  Terrel Manalo K 07/03/2019 9:40 AM

## 2019-07-08 NOTE — Progress Notes (Signed)
Patient Care Team: Tonia Ghent, MD as PCP - General (Family Medicine) Monna Fam, MD as Consulting Physician (Ophthalmology) Laurence Spates, MD (Inactive) as Consulting Physician (Gastroenterology) Henreitta Leber, DDS as Referring Physician (Dentistry) Mauro Kaufmann, RN as Oncology Nurse Navigator Rockwell Germany, RN as Oncology Nurse Navigator  DIAGNOSIS:    ICD-10-CM   1. Malignant neoplasm of upper-inner quadrant of right breast in female, estrogen receptor positive ([place])  C50.211    Z17.0     SUMMARY OF ONCOLOGIC HISTORY: Oncology History  Malignant neoplasm of upper-inner quadrant of right breast in female, estrogen receptor positive ([place])  02/13/2019 Initial Diagnosis   Routine screening mammogram detected 1.5cm right breast mass, 2.3cm on diagnostic mammogram, at the 1 o'clock position. Biopsy showed IDC, grade 2, HER-2 + by FISH, ER+ 100%, PR+ 90%, Ki67 15%.    03/19/2019 Surgery   Right mastectomy [name]): IDC, 2.6cm, grade 2, clear margins, 6 lymph nodes negative for carcinoma.    03/28/2019 -  Anti-estrogen oral therapy   Anastrozole    04/16/2019 -  Chemotherapy   The patient had trastuzumab-hyaluronidase-oysk (HERCEPTIN HYLECTA) 600-10000 MG-UNT/5ML chemo SQ injection 600 mg, 600 mg, Subcutaneous,  Once, 4 of 12 cycles Administration: 600 mg (04/16/2019), 600 mg (05/07/2019), 600 mg (05/28/2019), 600 mg (06/18/2019)  for chemotherapy treatment.     Genetic Testing   Negative genetic testing. No pathogenic variants identified on the [facility] STAT Breast cancer panel + Common Hereditary Cancers Panel. VUS in BRCA2 called c.5378A>G and a VUS in MUTYH called c.925C>T identified. The report date is 02/28/2019.   The STAT Breast cancer panel offered by [facility] includes sequencing and rearrangement analysis for the following 9 genes:  ATM, BRCA1, BRCA2, CDH1, CHEK2, PALB2, PTEN, STK11 and TP53.    The Common Hereditary Cancers Panel offered by [facility] includes  sequencing and/or deletion duplication testing of the following 48 genes: APC, ATM, AXIN2, BARD1, BMPR1A, BRCA1, BRCA2, BRIP1, CDH1, CDKN2A (p14ARF), CDKN2A (p16INK4a), CKD4, CHEK2, CTNNA1, DICER1, EPCAM (Deletion/duplication testing only), GREM1 (promoter region deletion/duplication testing only), KIT, MEN1, MLH1, MSH2, MSH3, MSH6, MUTYH, NBN, NF1, NHTL1, PALB2, PDGFRA, PMS2, POLD1, POLE, PTEN, RAD50, RAD51C, RAD51D, RNF43, SDHB, SDHC, SDHD, SMAD4, SMARCA4. STK11, TP53, TSC1, TSC2, and VHL.  The following genes were evaluated for sequence changes only: SDHA and HOXB13 c.251G>A variant only.     CHIEF COMPLIANT: Follow-up of right breast cancer on Herceptin   INTERVAL HISTORY: Amber [name] is a 86 y.o. with above-mentioned history of right breast cancer who underwent a mastectomy, and is currently on adjuvant Herceptin and antiestrogen therapy with anastrozole. Echo on 06/05/19 showed an ejection fraction of 60-65%. She presents to the clinic today for treatment.   ALLERGIES:  is allergic to ace inhibitors; escitalopram oxalate; lipitor [atorvastatin calcium]; zetia [ezetimibe]; and zocor [simvastatin - high dose].  MEDICATIONS:  Current Outpatient Medications  Medication Sig Dispense Refill  . amLODipine (NORVASC) 10 MG tablet Take 1 tablet (10 mg total) by mouth daily. 90 tablet 3  . anastrozole (ARIMIDEX) 1 MG tablet Take 1 tablet (1 mg total) by mouth daily. 90 tablet 3  . aspirin 325 MG tablet Take 325 mg by mouth daily.    . Cholecalciferol 1000 units tablet Take 1 tablet (1,000 Units total) by mouth daily.    . methocarbamol (ROBAXIN) 500 MG tablet Take 1 tablet (500 mg total) by mouth every 8 (eight) hours as needed (use for muscle cramps/pain). 15 tablet 0  . Multiple Vitamin (MULTIVITAMIN) tablet Take 1  tablet by mouth daily.      Marland Kitchen omega-3 acid ethyl esters (LOVAZA) 1 g capsule Take 1 capsule (1 g total) by mouth daily. 90 capsule 3  . oxyCODONE (OXY IR/ROXICODONE) 5 MG immediate  release tablet Take 1 tablet (5 mg total) by mouth every 6 (six) hours as needed for moderate pain, severe pain or breakthrough pain. 12 tablet 0  . rosuvastatin (CRESTOR) 5 MG tablet TAKE 1 TABLET BY MOUTH TWICE WEEKLY 24 tablet 1   No current facility-administered medications for this visit.    PHYSICAL EXAMINATION: ECOG PERFORMANCE STATUS: 1 - Symptomatic but completely ambulatory  Vitals:   07/09/19 1347  BP: 137/76  Pulse: 81  Resp: 20  Temp: 98.3 F (36.8 C)  SpO2: 98%   Filed Weights   07/09/19 1347  Weight: 135 lb 8 oz (61.5 kg)     LABORATORY DATA:  I have reviewed the data as listed CMP Latest Ref Rng & Units 09/26/2018 09/12/2017 09/06/2016  Glucose 70 - 99 mg/dL 111(H) 105(H) 96  BUN 6 - 23 mg/dL '15 17 18  ' Creatinine 0.40 - 1.20 mg/dL 0.88 0.81 0.89  Sodium 135 - 145 mEq/L 138 140 140  Potassium 3.5 - 5.1 mEq/L 3.8 4.1 3.8  Chloride 96 - 112 mEq/L 103 104 103  CO2 19 - 32 mEq/L '27 29 27  ' Calcium 8.4 - 10.5 mg/dL 9.5 9.3 9.6  Total Protein 6.0 - 8.3 g/dL 7.0 7.2 7.3  Total Bilirubin 0.2 - 1.2 mg/dL 0.4 0.6 0.6  Alkaline Phos 39 - 117 U/L 75 77 71  AST 0 - 37 U/L '16 17 15  ' ALT 0 - 35 U/L '10 15 12    ' Lab Results  Component Value Date   WBC 8.3 09/28/2017   HGB 14.0 09/28/2017   HCT 40.3 09/28/2017   MCV 88.6 09/28/2017   PLT 298.0 09/28/2017   NEUTROABS 4.6 09/28/2017    ASSESSMENT & PLAN:  Malignant neoplasm of upper-inner quadrant of right breast in female, estrogen receptor positive ([place]) 03/09/2019: Right mastectomy [name]): IDC, 2.6cm, grade 2, clear margins, 6 lymph nodes negative for carcinoma.,  ER 100%, PR 90%, HER-2 equivocal by IHC, FISH positive ratio 2.76, copy #6.2, Ki-67 15%   Treatment plan: 1. Adjuvant Herceptin every 3 weeks.   Subcutaneously starting 04/16/2019 2. Adjuvant antiestrogen therapywith anastrozole 1 mg daily starting 04/16/2019  Echo on 03/13/19 normal: EF 60-65% DEXA 12/2017 shows T score of -0.8 in left total  femur (normal).  Financial toxicity: It appears that patient had to spend a lot of money after now and she was informed that from now onwards her treatment will be covered.  She is happy to hear that.  Return to clinic every 3 weeks for Herceptin every 6 weeks to follow-up with [place]    No orders of the defined types were placed in this encounter.  The patient has a good understanding of the overall plan. she agrees with it. she will call with any problems that may develop before the next visit here.  Total time spent: 30 mins including face to face time and time spent for planning, charting and coordination of care  Nicholas Lose, MD 07/09/2019  I, Cloyde Reams Dorshimer, am acting as scribe for Dr. Nicholas Lose.  I have reviewed the above documentation for accuracy and completeness, and I agree with the above.

## 2019-07-09 ENCOUNTER — Inpatient Hospital Stay: Payer: Medicare HMO

## 2019-07-09 ENCOUNTER — Encounter: Payer: Self-pay | Admitting: *Deleted

## 2019-07-09 ENCOUNTER — Inpatient Hospital Stay: Payer: Medicare HMO | Attending: Hematology and Oncology | Admitting: Hematology and Oncology

## 2019-07-09 ENCOUNTER — Other Ambulatory Visit: Payer: Self-pay

## 2019-07-09 DIAGNOSIS — Z79811 Long term (current) use of aromatase inhibitors: Secondary | ICD-10-CM | POA: Diagnosis not present

## 2019-07-09 DIAGNOSIS — Z7982 Long term (current) use of aspirin: Secondary | ICD-10-CM | POA: Diagnosis not present

## 2019-07-09 DIAGNOSIS — Z9221 Personal history of antineoplastic chemotherapy: Secondary | ICD-10-CM | POA: Insufficient documentation

## 2019-07-09 DIAGNOSIS — Z9011 Acquired absence of right breast and nipple: Secondary | ICD-10-CM | POA: Insufficient documentation

## 2019-07-09 DIAGNOSIS — Z170 Estrogen receptor positive status [ER+]: Secondary | ICD-10-CM

## 2019-07-09 DIAGNOSIS — C50211 Malignant neoplasm of upper-inner quadrant of right female breast: Secondary | ICD-10-CM | POA: Diagnosis not present

## 2019-07-09 DIAGNOSIS — Z5112 Encounter for antineoplastic immunotherapy: Secondary | ICD-10-CM | POA: Insufficient documentation

## 2019-07-09 DIAGNOSIS — Z79899 Other long term (current) drug therapy: Secondary | ICD-10-CM | POA: Diagnosis not present

## 2019-07-09 MED ORDER — TRASTUZUMAB-HYALURONIDASE-OYSK 600-10000 MG-UNT/5ML ~~LOC~~ SOLN
600.0000 mg | Freq: Once | SUBCUTANEOUS | Status: AC
  Administered 2019-07-09: 600 mg via SUBCUTANEOUS
  Filled 2019-07-09: qty 5

## 2019-07-09 MED ORDER — ACETAMINOPHEN 325 MG PO TABS
ORAL_TABLET | ORAL | Status: AC
  Filled 2019-07-09: qty 2

## 2019-07-09 MED ORDER — ACETAMINOPHEN 325 MG PO TABS
650.0000 mg | ORAL_TABLET | Freq: Once | ORAL | Status: AC
  Administered 2019-07-09: 650 mg via ORAL

## 2019-07-09 MED ORDER — DIPHENHYDRAMINE HCL 25 MG PO CAPS
ORAL_CAPSULE | ORAL | Status: AC
  Filled 2019-07-09: qty 1

## 2019-07-09 MED ORDER — DIPHENHYDRAMINE HCL 25 MG PO CAPS
25.0000 mg | ORAL_CAPSULE | Freq: Once | ORAL | Status: AC
  Administered 2019-07-09: 25 mg via ORAL

## 2019-07-09 NOTE — Assessment & Plan Note (Signed)
03/09/2019: Right mastectomy Amber [name]): IDC, 2.6cm, grade 2, clear margins, 6 lymph nodes negative for carcinoma.,  ER 100%, PR 90%, HER-2 equivocal by IHC, FISH positive ratio 2.76, copy #6.2, Ki-67 15%   Treatment plan: 1. Adjuvant Herceptin every 3 weeks.   Subcutaneously starting 04/16/2019 2. Adjuvant antiestrogen therapywith anastrozole 1 mg daily starting 04/16/2019  Echo on 03/13/19 normal: EF 60-65% DEXA 12/2017 shows T score of -0.8 in left total femur (normal).   Return to clinic every 3 weeks for Herceptin every 6 weeks to follow-up with [place]

## 2019-07-09 NOTE — Patient Instructions (Signed)
[facility] Discharge Instructions for Patients Receiving Chemotherapy  Today you received the following chemotherapy agents: trastuzumab injection (herceptin hylecta).  To help prevent nausea and vomiting after your treatment, we encourage you to take your nausea medication as directed.   If you develop nausea and vomiting that is not controlled by your nausea medication, call the clinic.   BELOW ARE SYMPTOMS THAT SHOULD BE REPORTED IMMEDIATELY:  *FEVER GREATER THAN 100.5 F  *CHILLS WITH OR WITHOUT FEVER  NAUSEA AND VOMITING THAT IS NOT CONTROLLED WITH YOUR NAUSEA MEDICATION  *UNUSUAL SHORTNESS OF BREATH  *UNUSUAL BRUISING OR BLEEDING  TENDERNESS IN MOUTH AND THROAT WITH OR WITHOUT PRESENCE OF ULCERS  *URINARY PROBLEMS  *BOWEL PROBLEMS  UNUSUAL RASH Items with * indicate a potential emergency and should be followed up as soon as possible.  Feel free to call the clinic should you have any questions or concerns. The clinic phone number is [phone].  Please show the [facility] at check-in to the Emergency Department and triage nurse.

## 2019-07-10 DIAGNOSIS — R69 Illness, unspecified: Secondary | ICD-10-CM | POA: Diagnosis not present

## 2019-07-15 ENCOUNTER — Encounter: Payer: Self-pay | Admitting: *Deleted

## 2019-07-17 ENCOUNTER — Telehealth: Payer: Self-pay | Admitting: Hematology and Oncology

## 2019-07-17 DIAGNOSIS — R69 Illness, unspecified: Secondary | ICD-10-CM | POA: Diagnosis not present

## 2019-07-17 NOTE — Telephone Encounter (Signed)
Called patient regarding upcoming scheduled appointments, patient is notified. Patient will receive updated calender on 06/02.

## 2019-07-24 NOTE — Progress Notes (Signed)
Pharmacist Chemotherapy Monitoring - Follow Up Assessment    I verify that I have reviewed each item in the below checklist:  . Regimen for the patient is scheduled for the appropriate day and plan matches scheduled date. Marland Kitchen Appropriate non-routine labs are ordered dependent on drug ordered. . If applicable, additional medications reviewed and ordered per protocol based on lifetime cumulative doses and/or treatment regimen.   Plan for follow-up and/or issues identified: No . I-vent associated with next due treatment: No . MD and/or nursing notified: No  Amber [name] D 07/24/2019 3:18 PM

## 2019-07-30 ENCOUNTER — Ambulatory Visit: Payer: Medicare HMO

## 2019-07-31 ENCOUNTER — Other Ambulatory Visit: Payer: Self-pay

## 2019-07-31 ENCOUNTER — Inpatient Hospital Stay: Payer: Medicare HMO | Attending: Hematology and Oncology

## 2019-07-31 VITALS — BP 123/72 | HR 78 | Temp 98.2°F | Resp 19 | Ht 63.0 in | Wt 134.8 lb

## 2019-07-31 DIAGNOSIS — Z9011 Acquired absence of right breast and nipple: Secondary | ICD-10-CM | POA: Diagnosis not present

## 2019-07-31 DIAGNOSIS — Z5112 Encounter for antineoplastic immunotherapy: Secondary | ICD-10-CM | POA: Diagnosis present

## 2019-07-31 DIAGNOSIS — Z8349 Family history of other endocrine, nutritional and metabolic diseases: Secondary | ICD-10-CM | POA: Insufficient documentation

## 2019-07-31 DIAGNOSIS — Z79811 Long term (current) use of aromatase inhibitors: Secondary | ICD-10-CM | POA: Insufficient documentation

## 2019-07-31 DIAGNOSIS — Z170 Estrogen receptor positive status [ER+]: Secondary | ICD-10-CM

## 2019-07-31 DIAGNOSIS — R232 Flushing: Secondary | ICD-10-CM | POA: Diagnosis not present

## 2019-07-31 DIAGNOSIS — C50211 Malignant neoplasm of upper-inner quadrant of right female breast: Secondary | ICD-10-CM | POA: Insufficient documentation

## 2019-07-31 DIAGNOSIS — Z9221 Personal history of antineoplastic chemotherapy: Secondary | ICD-10-CM | POA: Insufficient documentation

## 2019-07-31 DIAGNOSIS — I10 Essential (primary) hypertension: Secondary | ICD-10-CM | POA: Diagnosis not present

## 2019-07-31 MED ORDER — ACETAMINOPHEN 325 MG PO TABS
650.0000 mg | ORAL_TABLET | Freq: Once | ORAL | Status: AC
  Administered 2019-07-31: 650 mg via ORAL

## 2019-07-31 MED ORDER — TRASTUZUMAB-HYALURONIDASE-OYSK 600-10000 MG-UNT/5ML ~~LOC~~ SOLN
600.0000 mg | Freq: Once | SUBCUTANEOUS | Status: AC
  Administered 2019-07-31: 600 mg via SUBCUTANEOUS
  Filled 2019-07-31: qty 5

## 2019-07-31 MED ORDER — DIPHENHYDRAMINE HCL 25 MG PO CAPS
25.0000 mg | ORAL_CAPSULE | Freq: Once | ORAL | Status: AC
  Administered 2019-07-31: 25 mg via ORAL

## 2019-07-31 MED ORDER — ACETAMINOPHEN 325 MG PO TABS
ORAL_TABLET | ORAL | Status: AC
  Filled 2019-07-31: qty 2

## 2019-07-31 MED ORDER — DIPHENHYDRAMINE HCL 25 MG PO CAPS
ORAL_CAPSULE | ORAL | Status: AC
  Filled 2019-07-31: qty 1

## 2019-07-31 NOTE — Patient Instructions (Addendum)
[facility] Discharge Instructions for Patients Receiving Chemotherapy  Today you received the following chemotherapy agents: trastuzumab  To help prevent nausea and vomiting after your treatment, we encourage you to take your nausea medication  as prescribed.    If you develop nausea and vomiting that is not controlled by your nausea medication, call the clinic.   BELOW ARE SYMPTOMS THAT SHOULD BE REPORTED IMMEDIATELY:  *FEVER GREATER THAN 100.5 F  *CHILLS WITH OR WITHOUT FEVER  NAUSEA AND VOMITING THAT IS NOT CONTROLLED WITH YOUR NAUSEA MEDICATION  *UNUSUAL SHORTNESS OF BREATH  *UNUSUAL BRUISING OR BLEEDING  TENDERNESS IN MOUTH AND THROAT WITH OR WITHOUT PRESENCE OF ULCERS  *URINARY PROBLEMS  *BOWEL PROBLEMS  UNUSUAL RASH Items with * indicate a potential emergency and should be followed up as soon as possible.  Feel free to call the clinic should you have any questions or concerns. The clinic phone number is [phone].  Please show the CHEMO ALERT CARD at check-in to the Emergency Department and triage nurse.     

## 2019-08-20 ENCOUNTER — Inpatient Hospital Stay (HOSPITAL_BASED_OUTPATIENT_CLINIC_OR_DEPARTMENT_OTHER): Payer: Medicare HMO | Admitting: Adult Health

## 2019-08-20 ENCOUNTER — Inpatient Hospital Stay: Payer: Medicare HMO

## 2019-08-20 ENCOUNTER — Other Ambulatory Visit: Payer: Self-pay

## 2019-08-20 ENCOUNTER — Encounter: Payer: Self-pay | Admitting: Adult Health

## 2019-08-20 ENCOUNTER — Encounter: Payer: Self-pay | Admitting: *Deleted

## 2019-08-20 ENCOUNTER — Telehealth: Payer: Self-pay | Admitting: *Deleted

## 2019-08-20 VITALS — BP 121/75 | HR 84 | Temp 98.2°F | Resp 18 | Ht 63.0 in | Wt 137.0 lb

## 2019-08-20 DIAGNOSIS — C50211 Malignant neoplasm of upper-inner quadrant of right female breast: Secondary | ICD-10-CM

## 2019-08-20 DIAGNOSIS — Z170 Estrogen receptor positive status [ER+]: Secondary | ICD-10-CM | POA: Diagnosis not present

## 2019-08-20 DIAGNOSIS — E2839 Other primary ovarian failure: Secondary | ICD-10-CM

## 2019-08-20 DIAGNOSIS — Z5112 Encounter for antineoplastic immunotherapy: Secondary | ICD-10-CM | POA: Diagnosis not present

## 2019-08-20 LAB — CBC WITH DIFFERENTIAL (CANCER CENTER ONLY)
Abs Immature Granulocytes: 0.02 10*3/uL (ref 0.00–0.07)
Basophils Absolute: 0 10*3/uL (ref 0.0–0.1)
Basophils Relative: 0 %
Eosinophils Absolute: 0.2 10*3/uL (ref 0.0–0.5)
Eosinophils Relative: 3 %
HCT: 39.4 % (ref 36.0–46.0)
Hemoglobin: 13.4 g/dL (ref 12.0–15.0)
Immature Granulocytes: 0 %
Lymphocytes Relative: 35 %
Lymphs Abs: 2.7 10*3/uL (ref 0.7–4.0)
MCH: 30.5 pg (ref 26.0–34.0)
MCHC: 34 g/dL (ref 30.0–36.0)
MCV: 89.7 fL (ref 80.0–100.0)
Monocytes Absolute: 0.6 10*3/uL (ref 0.1–1.0)
Monocytes Relative: 8 %
Neutro Abs: 4 10*3/uL (ref 1.7–7.7)
Neutrophils Relative %: 54 %
Platelet Count: 266 10*3/uL (ref 150–400)
RBC: 4.39 MIL/uL (ref 3.87–5.11)
RDW: 12.3 % (ref 11.5–15.5)
WBC Count: 7.5 10*3/uL (ref 4.0–10.5)
nRBC: 0 % (ref 0.0–0.2)

## 2019-08-20 LAB — CMP (CANCER CENTER ONLY)
ALT: 17 U/L (ref 0–44)
AST: 15 U/L (ref 15–41)
Albumin: 3.8 g/dL (ref 3.5–5.0)
Alkaline Phosphatase: 81 U/L (ref 38–126)
Anion gap: 9 (ref 5–15)
BUN: 17 mg/dL (ref 8–23)
CO2: 28 mmol/L (ref 22–32)
Calcium: 9.8 mg/dL (ref 8.9–10.3)
Chloride: 103 mmol/L (ref 98–111)
Creatinine: 1.09 mg/dL — ABNORMAL HIGH (ref 0.44–1.00)
GFR, Est AFR Am: 54 mL/min — ABNORMAL LOW (ref >60)
GFR, Estimated: 47 mL/min — ABNORMAL LOW (ref >60)
Glucose, Bld: 103 mg/dL — ABNORMAL HIGH (ref 70–99)
Potassium: 4.4 mmol/L (ref 3.5–5.1)
Sodium: 140 mmol/L (ref 135–145)
Total Bilirubin: 0.5 mg/dL (ref 0.3–1.2)
Total Protein: 7.2 g/dL (ref 6.5–8.1)

## 2019-08-20 MED ORDER — DIPHENHYDRAMINE HCL 25 MG PO CAPS
25.0000 mg | ORAL_CAPSULE | Freq: Once | ORAL | Status: AC
  Administered 2019-08-20: 25 mg via ORAL

## 2019-08-20 MED ORDER — DIPHENHYDRAMINE HCL 25 MG PO CAPS
ORAL_CAPSULE | ORAL | Status: AC
  Filled 2019-08-20: qty 1

## 2019-08-20 MED ORDER — ACETAMINOPHEN 325 MG PO TABS
650.0000 mg | ORAL_TABLET | Freq: Once | ORAL | Status: AC
  Administered 2019-08-20: 650 mg via ORAL

## 2019-08-20 MED ORDER — TRASTUZUMAB-HYALURONIDASE-OYSK 600-10000 MG-UNT/5ML ~~LOC~~ SOLN
600.0000 mg | Freq: Once | SUBCUTANEOUS | Status: AC
  Administered 2019-08-20: 600 mg via SUBCUTANEOUS
  Filled 2019-08-20: qty 5

## 2019-08-20 MED ORDER — ACETAMINOPHEN 325 MG PO TABS
ORAL_TABLET | ORAL | Status: AC
  Filled 2019-08-20: qty 2

## 2019-08-20 NOTE — Patient Instructions (Signed)
[facility] Discharge Instructions for Patients Receiving Chemotherapy  Today you received the following immunotherapy agent: Herceptin Hylecta  To help prevent nausea and vomiting after your treatment, we encourage you to take your nausea medication as directed by your MD   If you develop nausea and vomiting that is not controlled by your nausea medication, call the clinic.   BELOW ARE SYMPTOMS THAT SHOULD BE REPORTED IMMEDIATELY:  *FEVER GREATER THAN 100.5 F  *CHILLS WITH OR WITHOUT FEVER  NAUSEA AND VOMITING THAT IS NOT CONTROLLED WITH YOUR NAUSEA MEDICATION  *UNUSUAL SHORTNESS OF BREATH  *UNUSUAL BRUISING OR BLEEDING  TENDERNESS IN MOUTH AND THROAT WITH OR WITHOUT PRESENCE OF ULCERS  *URINARY PROBLEMS  *BOWEL PROBLEMS  UNUSUAL RASH Items with * indicate a potential emergency and should be followed up as soon as possible.  Feel free to call the clinic should you have any questions or concerns. The clinic phone number is [phone].  Please show the [facility] at check-in to the Emergency Department and triage nurse.

## 2019-08-20 NOTE — Progress Notes (Signed)
[name]:    Amber Ghent, MD Amber [name] [id number]   DIAGNOSIS: Cancer Staging Malignant neoplasm of upper-inner quadrant of right breast in female, estrogen receptor positive ([place]) Staging form: Breast, AJCC 8th Edition - Clinical stage from 02/05/2019: Stage IB (cT2, cN0, cM0, G2, ER+, PR+, HER2+) - Signed by Amber Lose, MD on 02/18/2019   SUMMARY OF ONCOLOGIC HISTORY: Oncology History  Malignant neoplasm of upper-inner quadrant of right breast in female, estrogen receptor positive ([place])  02/13/2019 Initial Diagnosis   Routine screening mammogram detected 1.5cm right breast mass, 2.3cm on diagnostic mammogram, at the 1 o'clock position. Biopsy showed IDC, grade 2, HER-2 + by FISH, ER+ 100%, PR+ 90%, Ki67 15%.    03/19/2019 Surgery   Right mastectomy Amber [name]): IDC, 2.6cm, grade 2, clear margins, 6 lymph nodes negative for carcinoma.    03/28/2019 -  Anti-estrogen oral therapy   Anastrozole    04/16/2019 -  Chemotherapy   The patient had trastuzumab-hyaluronidase-oysk (HERCEPTIN HYLECTA) 600-10000 MG-UNT/5ML chemo SQ injection 600 mg, 600 mg, Subcutaneous,  Once, 6 of 12 cycles Administration: 600 mg (04/16/2019), 600 mg (05/07/2019), 600 mg (05/28/2019), 600 mg (06/18/2019), 600 mg (07/09/2019), 600 mg (07/31/2019)  for chemotherapy treatment.     Genetic Testing   Negative genetic testing. No pathogenic variants identified on the [facility] STAT Breast cancer panel + Common Hereditary Cancers Panel. VUS in BRCA2 called c.5378A>G and a VUS in MUTYH called c.925C>T identified. The report date is 02/28/2019.   The STAT Breast cancer panel offered by [facility] includes sequencing and rearrangement analysis for the following 9 genes:  ATM, BRCA1, BRCA2, CDH1, CHEK2, PALB2, PTEN, STK11 and TP53.    The Common Hereditary Cancers Panel offered by [facility] includes sequencing and/or deletion duplication testing of the following 48 genes:  APC, ATM, AXIN2, BARD1, BMPR1A, BRCA1, BRCA2, BRIP1, CDH1, CDKN2A (p14ARF), CDKN2A (p16INK4a), CKD4, CHEK2, CTNNA1, DICER1, EPCAM (Deletion/duplication testing only), GREM1 (promoter region deletion/duplication testing only), KIT, MEN1, MLH1, MSH2, MSH3, MSH6, MUTYH, NBN, NF1, NHTL1, PALB2, PDGFRA, PMS2, POLD1, POLE, PTEN, RAD50, RAD51C, RAD51D, RNF43, SDHB, SDHC, SDHD, SMAD4, SMARCA4. STK11, TP53, TSC1, TSC2, and VHL.  The following genes were evaluated for sequence changes only: SDHA and HOXB13 c.251G>A variant only.     CURRENT THERAPY: Herceptin and Anastrozole  INTERVAL HISTORY: Amber [name] 86 y.o. female returns for evaluation prior to receiving Herceptin. She is tolerating this well.  She has a few loose BMs per day.  These are not watery, and respond to imodium.  She denies any recent antibiotics or other symtpotms.  Amber [name] is also taking Anastrozole daily.  She has tolerable hot flashes.  She has some vaginal dryness.  No frequent urinary tract infections.  Her most recent echocardiogram was completed on 06/05/2019 and showed an EF of 60-65%.   Patient Active Problem List   Diagnosis Date Noted  . Breast cancer, right ([place]) 03/19/2019  . Genetic testing 02/28/2019  . Family history of breast cancer   . Family history of colon cancer   . Family history of throat cancer   . Family history of bladder cancer   . Malignant neoplasm of upper-inner quadrant of right breast in female, estrogen receptor positive ([name]) 02/13/2019  . Medicare annual wellness visit, subsequent 10/08/2018  . Osteopenia 09/02/2015  . Advance care planning 09/02/2015  . TMJ syndrome 02/07/2012  . Fatigue 06/06/2011  . Benign hypertensive heart disease without heart failure 10/18/2010  . Pure  hypercholesterolemia 06/22/2010  . Back pain 06/22/2010  . History of colon cancer 06/22/2010  . Stress incontinence, female 06/22/2010  . History of Clostridium difficile infection 06/22/2010    is allergic to  ace inhibitors, escitalopram oxalate, lipitor [atorvastatin calcium], zetia [ezetimibe], and zocor [simvastatin - high dose].  MEDICAL HISTORY: Past Medical History:  Diagnosis Date  . Anemia   . Blood transfusion without reported diagnosis    during treatment for colon CA  . Breast cancer ([place]) 2020   right breast IDC  . C. difficile colitis    history of  . Cancer [facility])    Colon, s/p partial colectomy, no chemo or radiation  . Diverticulitis   . Family history of bladder cancer   . Family history of breast cancer   . Family history of colon cancer   . Family history of throat cancer   . Hyperlipidemia   . Hypertension   . Urinary incontinence    stress incont.  . Urinary tract infection     SURGICAL HISTORY: Past Surgical History:  Procedure Laterality Date  . ABDOMINAL HYSTERECTOMY    . BLADDER SUSPENSION    . CARDIOVASCULAR STRESS TEST  11/02/2004   EF 76%  . CATARACT EXTRACTION    . COLON SURGERY  2010   colon cancer  . MASTECTOMY W/ SENTINEL NODE BIOPSY Right 03/19/2019   Procedure: RIGHT MASTECTOMY WITH RIGHT AXILLARY SENTINEL LYMPH NODE BIOPSY;  Surgeon: Rolm Bookbinder, MD;  Location: [facility];  Service: General;  Laterality: Right;    SOCIAL HISTORY: Social History   Socioeconomic History  . Marital status: Widowed    Spouse name: Not on file  . Number of children: Not on file  . Years of education: Not on file  . Highest education level: Not on file  Occupational History  . Not on file  Tobacco Use  . Smoking status: Never Smoker  . Smokeless tobacco: Never Used  Vaping Use  . Vaping Use: Never used  Substance and Sexual Activity  . Alcohol use: No  . Drug use: No  . Sexual activity: Not Currently    Birth control/protection: Surgical  Other Topics Concern  . Not on file  Social History Narrative   Married 1956   1 son, local   Social Determinants of Health   Financial Resource Strain:   . Difficulty of Paying Living  Expenses:   Food Insecurity:   . Worried About Charity fundraiser in the Last Year:   . Arboriculturist in the Last Year:   Transportation Needs:   . Film/video editor (Medical):   Marland Kitchen Lack of Transportation (Non-Medical):   Physical Activity:   . Days of Exercise per Week:   . Minutes of Exercise per Session:   Stress:   . Feeling of Stress :   Social Connections:   . Frequency of Communication with Friends and Family:   . Frequency of Social Gatherings with Friends and Family:   . Attends Religious Services:   . Active Member of Clubs or Organizations:   . Attends Archivist Meetings:   Marland Kitchen Marital Status:   Intimate Partner Violence:   . Fear of Current or Ex-Partner:   . Emotionally Abused:   Marland Kitchen Physically Abused:   . Sexually Abused:     FAMILY HISTORY: Family History  Problem Relation Age of Onset  . Cancer Mother        breast cancer  . Breast cancer Mother   .  Cancer Father        colon cancer  . Stroke Father   . Colon cancer Father   . Cancer Brother        bladder cancer  . Cancer Brother        throat cancer    Review of Systems  Constitutional: Negative for appetite change, chills, fatigue, fever and unexpected weight change.  HENT:   Negative for hearing loss and lump/mass.   Eyes: Negative for eye problems and icterus.  Respiratory: Negative for chest tightness, cough and shortness of breath.   Cardiovascular: Negative for chest pain, leg swelling and palpitations.  Gastrointestinal: Positive for diarrhea. Negative for abdominal distention, abdominal pain, blood in stool, constipation, nausea and vomiting.  Endocrine: Positive for hot flashes.  Musculoskeletal: Positive for arthralgias.  Skin: Negative for itching and rash.  Neurological: Negative for dizziness, extremity weakness, headaches and numbness.  Hematological: Negative for adenopathy. Does not bruise/bleed easily.  Psychiatric/Behavioral: Negative for depression. The patient  is not nervous/anxious.       PHYSICAL EXAMINATION  ECOG PERFORMANCE STATUS: 1 - Symptomatic but completely ambulatory  Vitals:   08/20/19 1332  BP: 121/75  Pulse: 84  Resp: 18  Temp: 98.2 F (36.8 C)  SpO2: 97%    Physical Exam Constitutional:      General: She is not in acute distress.    Appearance: Normal appearance. She is not toxic-appearing.  Eyes:     General: No scleral icterus. Cardiovascular:     Rate and Rhythm: Normal rate and regular rhythm.     Pulses: Normal pulses.     Heart sounds: Normal heart sounds.  Pulmonary:     Effort: Pulmonary effort is normal.     Breath sounds: Normal breath sounds.  Abdominal:     General: Abdomen is flat. Bowel sounds are normal.     Palpations: Abdomen is soft.  Musculoskeletal:        General: No swelling.  Skin:    General: Skin is warm and dry.     Capillary Refill: Capillary refill takes less than 2 seconds.     Findings: No rash.  Neurological:     General: No focal deficit present.     Mental Status: She is alert.  Psychiatric:        Mood and Affect: Mood normal.        Behavior: Behavior normal.     LABORATORY DATA:  CBC    Component Value Date/Time   WBC 7.5 08/20/2019 1306   WBC 8.3 09/28/2017 1301   RBC 4.39 08/20/2019 1306   HGB 13.4 08/20/2019 1306   HGB 12.7 07/11/2008 1534   HCT 39.4 08/20/2019 1306   HCT 37.7 07/11/2008 1534   PLT 266 08/20/2019 1306   PLT 276 07/11/2008 1534   MCV 89.7 08/20/2019 1306   MCV 82.9 07/11/2008 1534   MCH 30.5 08/20/2019 1306   MCHC 34.0 08/20/2019 1306   RDW 12.3 08/20/2019 1306   RDW 22.9 (H) 07/11/2008 1534   LYMPHSABS 2.7 08/20/2019 1306   LYMPHSABS 2.0 07/11/2008 1534   MONOABS 0.6 08/20/2019 1306   MONOABS 0.4 07/11/2008 1534   EOSABS 0.2 08/20/2019 1306   EOSABS 0.1 07/11/2008 1534   BASOSABS 0.0 08/20/2019 1306   BASOSABS 0.0 07/11/2008 1534    CMP     Component Value Date/Time   NA 140 08/20/2019 1306   K 4.4 08/20/2019 1306   CL  103 08/20/2019 1306   CO2 28 08/20/2019  1306   GLUCOSE 103 (H) 08/20/2019 1306   BUN 17 08/20/2019 1306   CREATININE 1.09 (H) 08/20/2019 1306   CREATININE 0.73 12/29/2014 0931   CALCIUM 9.8 08/20/2019 1306   PROT 7.2 08/20/2019 1306   ALBUMIN 3.8 08/20/2019 1306   AST 15 08/20/2019 1306   ALT 17 08/20/2019 1306   ALKPHOS 81 08/20/2019 1306   BILITOT 0.5 08/20/2019 1306   GFRNONAA 47 (L) 08/20/2019 1306   GFRAA 54 (L) 08/20/2019 1306       ASSESSMENT and PLAN:   Malignant neoplasm of upper-inner quadrant of right breast in female, estrogen receptor positive ([place]) 03/09/2019: Right mastectomy Amber [name]): IDC, 2.6cm, grade 2, clear margins, 6 lymph nodes negative for carcinoma.,  ER 100%, PR 90%, HER-2 equivocal by IHC, FISH positive ratio 2.76, copy #6.2, Ki-67 15%   Treatment plan: 1. Adjuvant Herceptin every 3 weeks.   Subcutaneously starting 04/16/2019 2. Adjuvant antiestrogen therapywith anastrozole 1 mg daily starting 04/16/2019  Echo on 03/13/19 normal: EF 60-65% Echo on 06/05/2019 normal: EF 60-65% DEXA 12/2017 shows T score of -0.8 in left total femur (normal).   Tanish is tolerating her treatment well and will continue on treatment above.  She is due for her echocardiogram and I recommended that she have this completed in July.  She has some loose bms, and I let her know that she can take imodium if needed for the stools.  If they become watery, or if she develops abdominal pain/cramping, she will call me and I will order stool studies.    She is tolerating Anastrozole well.  I placed orders for her next bone density testing and left breast screening mammogram both due in November.    Return to clinic every 3 weeks for Herceptin every 6 weeks to follow-up with [place]   Orders Placed This Encounter  Procedures  . MM 3D SCREEN BREAST UNI LEFT    Standing Status:   Future    Standing Expiration Date:   08/19/2020    Order Specific Question:   Reason for Exam (SYMPTOM  OR  DIAGNOSIS REQUIRED)    Answer:   breast cancer    Order Specific Question:   Preferred imaging location?    Answer:   External    Comments:   [facility]  . DG Bone Density    Standing Status:   Future    Standing Expiration Date:   08/19/2020    Order Specific Question:   Reason for Exam (SYMPTOM  OR DIAGNOSIS REQUIRED)    Answer:   estrogen deficiency    Order Specific Question:   Preferred imaging location?    Answer:   External    Comments:   [facility]  . ECHOCARDIOGRAM COMPLETE    Standing Status:   Future    Standing Expiration Date:   08/19/2020    Order Specific Question:   Where should this test be performed    Answer:   [facility]    Order Specific Question:   Perflutren DEFINITY (image enhancing agent) should be administered unless hypersensitivity or allergy exist    Answer:   Administer Perflutren    Order Specific Question:   Reason for exam-Echo    Answer:   Chemotherapy evaluation  v87.41 / v58.11    All questions were answered. The patient knows to call the clinic with any problems, questions or concerns. We can certainly see the patient much sooner if necessary.  Total encounter time: 20 minutes*  Wilber Bihari, NP 08/20/19 2:45  PM Medical Oncology and Hematology [facility] [place], [place] [address] Tel. [phone]    Fax. [phone]  *Total Encounter Time as defined by the Centers for Medicare and Medicaid Services includes, in addition to the face-to-face time of a patient visit (documented in the note above) non-face-to-face time: obtaining and reviewing outside history, ordering and reviewing medications, tests or procedures, care coordination (communications with other health care professionals or caregivers) and documentation in the medical record.

## 2019-08-20 NOTE — Assessment & Plan Note (Addendum)
03/09/2019: Right mastectomy Amber [name]): IDC, 2.6cm, grade 2, clear margins, 6 lymph nodes negative for carcinoma.,  ER 100%, PR 90%, HER-2 equivocal by IHC, FISH positive ratio 2.76, copy #6.2, Ki-67 15%   Treatment plan: 1. Adjuvant Herceptin every 3 weeks.   Subcutaneously starting 04/16/2019 2. Adjuvant antiestrogen therapywith anastrozole 1 mg daily starting 04/16/2019  Echo on 03/13/19 normal: EF 60-65% Echo on 06/05/2019 normal: EF 60-65% DEXA 12/2017 shows T score of -0.8 in left total femur (normal).   Amber [name] is tolerating her treatment well and will continue on treatment above.  She is due for her echocardiogram and I recommended that she have this completed in July.  She has some loose bms, and I let her know that she can take imodium if needed for the stools.  If they become watery, or if she develops abdominal pain/cramping, she will call me and I will order stool studies.    She is tolerating Anastrozole well.  I placed orders for her next bone density testing and left breast screening mammogram both due in November.    Return to clinic every 3 weeks for Herceptin every 6 weeks to follow-up with [place]

## 2019-08-20 NOTE — Telephone Encounter (Signed)
Faxed orders for mammogram and bone density to [facility]

## 2019-08-21 ENCOUNTER — Telehealth: Payer: Self-pay | Admitting: Adult Health

## 2019-08-21 NOTE — Telephone Encounter (Signed)
No 6/22 los. No changes made to pt's schedule.  

## 2019-08-26 ENCOUNTER — Ambulatory Visit: Payer: Medicare HMO | Admitting: Hematology and Oncology

## 2019-08-26 ENCOUNTER — Ambulatory Visit: Payer: Medicare HMO

## 2019-08-26 ENCOUNTER — Other Ambulatory Visit: Payer: Medicare HMO

## 2019-09-04 ENCOUNTER — Telehealth: Payer: Self-pay | Admitting: Hematology and Oncology

## 2019-09-04 NOTE — Telephone Encounter (Signed)
Called pt per 7/7 sch msg - no answer. Left message for patient to call back if reschedule is needed.

## 2019-09-04 NOTE — Progress Notes (Signed)
Pharmacist Chemotherapy Monitoring - Follow Up Assessment    I verify that I have reviewed each item in the below checklist:  . Regimen for the patient is scheduled for the appropriate day and plan matches scheduled date. Marland Kitchen Appropriate non-routine labs are ordered dependent on drug ordered. . If applicable, additional medications reviewed and ordered per protocol based on lifetime cumulative doses and/or treatment regimen.   Plan for follow-up and/or issues identified: Yes . I-vent associated with next due treatment: Yes . MD and/or nursing notified: No   Kennith Center, Pharm.D., CPP 09/04/2019@3 :03 PM

## 2019-09-09 ENCOUNTER — Other Ambulatory Visit: Payer: Self-pay

## 2019-09-09 ENCOUNTER — Inpatient Hospital Stay: Payer: Medicare HMO | Attending: Hematology and Oncology

## 2019-09-09 VITALS — BP 127/83 | HR 76 | Temp 98.6°F | Resp 18

## 2019-09-09 DIAGNOSIS — Z170 Estrogen receptor positive status [ER+]: Secondary | ICD-10-CM | POA: Insufficient documentation

## 2019-09-09 DIAGNOSIS — C50211 Malignant neoplasm of upper-inner quadrant of right female breast: Secondary | ICD-10-CM | POA: Insufficient documentation

## 2019-09-09 DIAGNOSIS — Z5112 Encounter for antineoplastic immunotherapy: Secondary | ICD-10-CM | POA: Insufficient documentation

## 2019-09-09 MED ORDER — DIPHENHYDRAMINE HCL 25 MG PO CAPS
ORAL_CAPSULE | ORAL | Status: AC
  Filled 2019-09-09: qty 1

## 2019-09-09 MED ORDER — ACETAMINOPHEN 325 MG PO TABS
ORAL_TABLET | ORAL | Status: AC
  Filled 2019-09-09: qty 2

## 2019-09-09 MED ORDER — ACETAMINOPHEN 325 MG PO TABS
650.0000 mg | ORAL_TABLET | Freq: Once | ORAL | Status: AC
  Administered 2019-09-09: 650 mg via ORAL

## 2019-09-09 MED ORDER — TRASTUZUMAB-HYALURONIDASE-OYSK 600-10000 MG-UNT/5ML ~~LOC~~ SOLN
600.0000 mg | Freq: Once | SUBCUTANEOUS | Status: AC
  Administered 2019-09-09: 600 mg via SUBCUTANEOUS
  Filled 2019-09-09: qty 5

## 2019-09-09 MED ORDER — DIPHENHYDRAMINE HCL 25 MG PO CAPS
25.0000 mg | ORAL_CAPSULE | Freq: Once | ORAL | Status: AC
  Administered 2019-09-09: 25 mg via ORAL

## 2019-09-09 NOTE — Patient Instructions (Signed)
[facility] Discharge Instructions for Patients Receiving Chemotherapy  Today you received the following chemotherapy agents: trastuzumab hylecta  To help prevent nausea and vomiting after your treatment, we encourage you to take your nausea medication as directed.    If you develop nausea and vomiting that is not controlled by your nausea medication, call the clinic.   BELOW ARE SYMPTOMS THAT SHOULD BE REPORTED IMMEDIATELY:  *FEVER GREATER THAN 100.5 F  *CHILLS WITH OR WITHOUT FEVER  NAUSEA AND VOMITING THAT IS NOT CONTROLLED WITH YOUR NAUSEA MEDICATION  *UNUSUAL SHORTNESS OF BREATH  *UNUSUAL BRUISING OR BLEEDING  TENDERNESS IN MOUTH AND THROAT WITH OR WITHOUT PRESENCE OF ULCERS  *URINARY PROBLEMS  *BOWEL PROBLEMS  UNUSUAL RASH Items with * indicate a potential emergency and should be followed up as soon as possible.  Feel free to call the clinic should you have any questions or concerns. The clinic phone number is [phone].  Please show the [facility] at check-in to the Emergency Department and triage nurse.

## 2019-09-10 ENCOUNTER — Inpatient Hospital Stay: Payer: Medicare HMO

## 2019-09-17 ENCOUNTER — Ambulatory Visit: Payer: Medicare HMO

## 2019-09-19 ENCOUNTER — Other Ambulatory Visit: Payer: Self-pay

## 2019-09-19 ENCOUNTER — Ambulatory Visit (HOSPITAL_COMMUNITY)
Admission: RE | Admit: 2019-09-19 | Discharge: 2019-09-19 | Disposition: A | Discharge status: Home / Self Care | Payer: Medicare HMO | Source: Ambulatory Visit | Attending: Adult Health | Admitting: Adult Health

## 2019-09-19 DIAGNOSIS — Z01818 Encounter for other preprocedural examination: Secondary | ICD-10-CM | POA: Insufficient documentation

## 2019-09-19 DIAGNOSIS — C50211 Malignant neoplasm of upper-inner quadrant of right female breast: Secondary | ICD-10-CM | POA: Insufficient documentation

## 2019-09-19 DIAGNOSIS — I10 Essential (primary) hypertension: Secondary | ICD-10-CM | POA: Insufficient documentation

## 2019-09-19 DIAGNOSIS — E785 Hyperlipidemia, unspecified: Secondary | ICD-10-CM | POA: Diagnosis not present

## 2019-09-19 DIAGNOSIS — Z170 Estrogen receptor positive status [ER+]: Secondary | ICD-10-CM | POA: Diagnosis not present

## 2019-09-19 LAB — ECHOCARDIOGRAM COMPLETE
Area-P 1/2: 2.79 cm2
S' Lateral: 2.55 cm

## 2019-09-19 NOTE — Progress Notes (Signed)
°  Echocardiogram 2D Echocardiogram has been performed.  Jannett Celestine 09/19/2019, 11:39 AM

## 2019-09-30 ENCOUNTER — Other Ambulatory Visit: Payer: Self-pay

## 2019-09-30 DIAGNOSIS — C50211 Malignant neoplasm of upper-inner quadrant of right female breast: Secondary | ICD-10-CM

## 2019-09-30 DIAGNOSIS — Z170 Estrogen receptor positive status [ER+]: Secondary | ICD-10-CM

## 2019-09-30 NOTE — Progress Notes (Signed)
Patient Care Team: Tonia Ghent, MD as PCP - General (Family Medicine) Monna Fam, MD as Consulting Physician (Ophthalmology) Laurence Spates, MD (Inactive) as Consulting Physician (Gastroenterology) Henreitta Leber, DDS as Referring Physician (Dentistry) Mauro Kaufmann, RN as Oncology Nurse Navigator Rockwell Germany, RN as Oncology Nurse Navigator  DIAGNOSIS:    ICD-10-CM   1. Malignant neoplasm of upper-inner quadrant of right breast in female, estrogen receptor positive ([place])  C50.211    Z17.0     SUMMARY OF ONCOLOGIC HISTORY: Oncology History  Malignant neoplasm of upper-inner quadrant of right breast in female, estrogen receptor positive ([place])  02/13/2019 Initial Diagnosis   Routine screening mammogram detected 1.5cm right breast mass, 2.3cm on diagnostic mammogram, at the 1 o'clock position. Biopsy showed IDC, grade 2, HER-2 + by FISH, ER+ 100%, PR+ 90%, Ki67 15%.    03/19/2019 Surgery   Right mastectomy [name]): IDC, 2.6cm, grade 2, clear margins, 6 lymph nodes negative for carcinoma.    03/28/2019 -  Anti-estrogen oral therapy   Anastrozole    04/16/2019 -  Chemotherapy   The patient had trastuzumab-hyaluronidase-oysk (HERCEPTIN HYLECTA) 600-10000 MG-UNT/5ML chemo SQ injection 600 mg, 600 mg, Subcutaneous,  Once, 8 of 12 cycles Administration: 600 mg (04/16/2019), 600 mg (05/07/2019), 600 mg (05/28/2019), 600 mg (06/18/2019), 600 mg (07/09/2019), 600 mg (07/31/2019), 600 mg (08/20/2019), 600 mg (09/09/2019)  for chemotherapy treatment.     Genetic Testing   Negative genetic testing. No pathogenic variants identified on the [facility] STAT Breast cancer panel + Common Hereditary Cancers Panel. VUS in BRCA2 called c.5378A>G and a VUS in MUTYH called c.925C>T identified. The report date is 02/28/2019.   The STAT Breast cancer panel offered by [facility] includes sequencing and rearrangement analysis for the following 9 genes:  ATM, BRCA1, BRCA2, CDH1, CHEK2, PALB2, PTEN, STK11  and TP53.    The Common Hereditary Cancers Panel offered by [facility] includes sequencing and/or deletion duplication testing of the following 48 genes: APC, ATM, AXIN2, BARD1, BMPR1A, BRCA1, BRCA2, BRIP1, CDH1, CDKN2A (p14ARF), CDKN2A (p16INK4a), CKD4, CHEK2, CTNNA1, DICER1, EPCAM (Deletion/duplication testing only), GREM1 (promoter region deletion/duplication testing only), KIT, MEN1, MLH1, MSH2, MSH3, MSH6, MUTYH, NBN, NF1, NHTL1, PALB2, PDGFRA, PMS2, POLD1, POLE, PTEN, RAD50, RAD51C, RAD51D, RNF43, SDHB, SDHC, SDHD, SMAD4, SMARCA4. STK11, TP53, TSC1, TSC2, and VHL.  The following genes were evaluated for sequence changes only: SDHA and HOXB13 c.251G>A variant only.     CHIEF COMPLIANT: Follow-up of right breast cancer on Herceptin   INTERVAL HISTORY: Amber [name] is a 86 y.o. with above-mentioned history of right breast cancer who underwent a mastectomy, and is currently on adjuvant Herceptin and antiestrogen therapy with anastrozole. Echo on 09/19/19 showed an ejection fraction of 55-60%. She presents to the clinic today for treatment.   ALLERGIES:  is allergic to ace inhibitors, escitalopram oxalate, lipitor [atorvastatin calcium], zetia [ezetimibe], and zocor [simvastatin - high dose].  MEDICATIONS:  Current Outpatient Medications  Medication Sig Dispense Refill  . amLODipine (NORVASC) 10 MG tablet Take 1 tablet (10 mg total) by mouth daily. 90 tablet 3  . anastrozole (ARIMIDEX) 1 MG tablet Take 1 tablet (1 mg total) by mouth daily. 90 tablet 3  . aspirin 325 MG tablet Take 325 mg by mouth daily.    . Cholecalciferol 1000 units tablet Take 1 tablet (1,000 Units total) by mouth daily.    . Multiple Vitamin (MULTIVITAMIN) tablet Take 1 tablet by mouth daily.      Marland Kitchen omega-3 acid ethyl esters (LOVAZA) 1  g capsule Take 1 capsule (1 g total) by mouth daily. 90 capsule 3  . rosuvastatin (CRESTOR) 5 MG tablet TAKE 1 TABLET BY MOUTH TWICE WEEKLY 24 tablet 1   No current facility-administered  medications for this visit.    PHYSICAL EXAMINATION: ECOG PERFORMANCE STATUS: 1 - Symptomatic but completely ambulatory  Vitals:   10/01/19 1332  BP: 120/70  Pulse: 80  Resp: 16  Temp: 98.9 F (37.2 C)  SpO2: 97%   Filed Weights   10/01/19 1332  Weight: 137 lb (62.1 kg)    LABORATORY DATA:  I have reviewed the data as listed CMP Latest Ref Rng & Units 10/01/2019 08/20/2019 09/26/2018  Glucose 70 - 99 mg/dL 109(H) 103(H) 111(H)  BUN 8 - 23 mg/dL '16 17 15  ' Creatinine 0.44 - 1.00 mg/dL 1.23(H) 1.09(H) 0.88  Sodium 135 - 145 mmol/L 141 140 138  Potassium 3.5 - 5.1 mmol/L 4.0 4.4 3.8  Chloride 98 - 111 mmol/L 106 103 103  CO2 22 - 32 mmol/L '26 28 27  ' Calcium 8.9 - 10.3 mg/dL 10.0 9.8 9.5  Total Protein 6.5 - 8.1 g/dL 7.4 7.2 7.0  Total Bilirubin 0.3 - 1.2 mg/dL 0.4 0.5 0.4  Alkaline Phos 38 - 126 U/L 87 81 75  AST 15 - 41 U/L '16 15 16  ' ALT 0 - 44 U/L '12 17 10    ' Lab Results  Component Value Date   WBC 7.5 10/01/2019   HGB 14.3 10/01/2019   HCT 41.9 10/01/2019   MCV 90.1 10/01/2019   PLT 281 10/01/2019   NEUTROABS 4.2 10/01/2019    ASSESSMENT & PLAN:  Malignant neoplasm of upper-inner quadrant of right breast in female, estrogen receptor positive ([place]) 03/09/2019: Right mastectomy [name]): IDC, 2.6cm, grade 2, clear margins, 6 lymph nodes negative for carcinoma., ER 100%, PR 90%, HER-2 equivocal by IHC, FISH positive ratio 2.76, copy #6.2, Ki-67 15%   Treatment plan: 1. Adjuvant Herceptin every 3 weeks. Subcutaneously starting 04/16/2019 2. Adjuvant antiestrogen therapywith anastrozole 1 mg daily starting 04/16/2019  Echo on 03/13/19 normal: EF 60-65% Echo on 06/05/2019 normal: EF 60-65% Echo on 09/19/2019: EF 55 to 60%  DEXA 12/2017 shows T score of -0.8 in left total femur (normal).  Anastrozole toxicities: Denies any major adverse effects to anastrozole therapy. Bone density will be ordered along with her mammogram in November.  Return to clinic every 3  weeks for Herceptin every 6 weeks to follow-up with [place] Even though Herceptin needs to go on until end of January, we decided to finish it on 02/25/2020 based on her personal wishes.   No orders of the defined types were placed in this encounter.  The patient has a good understanding of the overall plan. she agrees with it. she will call with any problems that may develop before the next visit here.  Total time spent: 30 mins including face to face time and time spent for planning, charting and coordination of care  Nicholas Lose, MD 10/01/2019  I, Cloyde Reams Dorshimer, am acting as scribe for Dr. Nicholas Lose.  I have reviewed the above documentation for accuracy and completeness, and I agree with the above.

## 2019-10-01 ENCOUNTER — Inpatient Hospital Stay: Payer: Medicare HMO

## 2019-10-01 ENCOUNTER — Inpatient Hospital Stay: Payer: Medicare HMO | Attending: Hematology and Oncology

## 2019-10-01 ENCOUNTER — Inpatient Hospital Stay (HOSPITAL_BASED_OUTPATIENT_CLINIC_OR_DEPARTMENT_OTHER): Payer: Medicare HMO | Admitting: Hematology and Oncology

## 2019-10-01 ENCOUNTER — Encounter: Payer: Self-pay | Admitting: *Deleted

## 2019-10-01 ENCOUNTER — Other Ambulatory Visit: Payer: Self-pay

## 2019-10-01 DIAGNOSIS — Z170 Estrogen receptor positive status [ER+]: Secondary | ICD-10-CM | POA: Diagnosis not present

## 2019-10-01 DIAGNOSIS — C50211 Malignant neoplasm of upper-inner quadrant of right female breast: Secondary | ICD-10-CM

## 2019-10-01 DIAGNOSIS — Z79811 Long term (current) use of aromatase inhibitors: Secondary | ICD-10-CM | POA: Diagnosis not present

## 2019-10-01 DIAGNOSIS — Z5112 Encounter for antineoplastic immunotherapy: Secondary | ICD-10-CM | POA: Diagnosis not present

## 2019-10-01 DIAGNOSIS — Z9011 Acquired absence of right breast and nipple: Secondary | ICD-10-CM | POA: Insufficient documentation

## 2019-10-01 LAB — CMP (CANCER CENTER ONLY)
ALT: 12 U/L (ref 0–44)
AST: 16 U/L (ref 15–41)
Albumin: 3.8 g/dL (ref 3.5–5.0)
Alkaline Phosphatase: 87 U/L (ref 38–126)
Anion gap: 9 (ref 5–15)
BUN: 16 mg/dL (ref 8–23)
CO2: 26 mmol/L (ref 22–32)
Calcium: 10 mg/dL (ref 8.9–10.3)
Chloride: 106 mmol/L (ref 98–111)
Creatinine: 1.23 mg/dL — ABNORMAL HIGH (ref 0.44–1.00)
GFR, Est AFR Am: 47 mL/min — ABNORMAL LOW (ref >60)
GFR, Estimated: 41 mL/min — ABNORMAL LOW (ref >60)
Glucose, Bld: 109 mg/dL — ABNORMAL HIGH (ref 70–99)
Potassium: 4 mmol/L (ref 3.5–5.1)
Sodium: 141 mmol/L (ref 135–145)
Total Bilirubin: 0.4 mg/dL (ref 0.3–1.2)
Total Protein: 7.4 g/dL (ref 6.5–8.1)

## 2019-10-01 LAB — CBC WITH DIFFERENTIAL (CANCER CENTER ONLY)
Abs Immature Granulocytes: 0.02 10*3/uL (ref 0.00–0.07)
Basophils Absolute: 0 10*3/uL (ref 0.0–0.1)
Basophils Relative: 0 %
Eosinophils Absolute: 0.2 10*3/uL (ref 0.0–0.5)
Eosinophils Relative: 3 %
HCT: 41.9 % (ref 36.0–46.0)
Hemoglobin: 14.3 g/dL (ref 12.0–15.0)
Immature Granulocytes: 0 %
Lymphocytes Relative: 30 %
Lymphs Abs: 2.2 10*3/uL (ref 0.7–4.0)
MCH: 30.8 pg (ref 26.0–34.0)
MCHC: 34.1 g/dL (ref 30.0–36.0)
MCV: 90.1 fL (ref 80.0–100.0)
Monocytes Absolute: 0.8 10*3/uL (ref 0.1–1.0)
Monocytes Relative: 10 %
Neutro Abs: 4.2 10*3/uL (ref 1.7–7.7)
Neutrophils Relative %: 57 %
Platelet Count: 281 10*3/uL (ref 150–400)
RBC: 4.65 MIL/uL (ref 3.87–5.11)
RDW: 12.4 % (ref 11.5–15.5)
WBC Count: 7.5 10*3/uL (ref 4.0–10.5)
nRBC: 0 % (ref 0.0–0.2)

## 2019-10-01 MED ORDER — ACETAMINOPHEN 325 MG PO TABS
650.0000 mg | ORAL_TABLET | Freq: Once | ORAL | Status: AC
  Administered 2019-10-01: 650 mg via ORAL

## 2019-10-01 MED ORDER — TRASTUZUMAB-HYALURONIDASE-OYSK 600-10000 MG-UNT/5ML ~~LOC~~ SOLN
600.0000 mg | Freq: Once | SUBCUTANEOUS | Status: AC
  Administered 2019-10-01: 600 mg via SUBCUTANEOUS
  Filled 2019-10-01: qty 5

## 2019-10-01 MED ORDER — DIPHENHYDRAMINE HCL 25 MG PO CAPS
ORAL_CAPSULE | ORAL | Status: AC
  Filled 2019-10-01: qty 1

## 2019-10-01 MED ORDER — ACETAMINOPHEN 325 MG PO TABS
ORAL_TABLET | ORAL | Status: AC
  Filled 2019-10-01: qty 2

## 2019-10-01 MED ORDER — DIPHENHYDRAMINE HCL 25 MG PO CAPS
25.0000 mg | ORAL_CAPSULE | Freq: Once | ORAL | Status: AC
  Administered 2019-10-01: 25 mg via ORAL

## 2019-10-01 NOTE — Assessment & Plan Note (Signed)
03/09/2019: Right mastectomy Amber [name]): IDC, 2.6cm, grade 2, clear margins, 6 lymph nodes negative for carcinoma., ER 100%, PR 90%, HER-2 equivocal by IHC, FISH positive ratio 2.76, copy #6.2, Ki-67 15%   Treatment plan: 1. Adjuvant Herceptin every 3 weeks. Subcutaneously starting 04/16/2019 2. Adjuvant antiestrogen therapywith anastrozole 1 mg daily starting 04/16/2019  Echo on 03/13/19 normal: EF 60-65% Echo on 06/05/2019 normal: EF 60-65% Echo on 09/19/2019: EF 55 to 60%  DEXA 12/2017 shows T score of -0.8 in left total femur (normal).  Anastrozole toxicities: Denies any major adverse effects to anastrozole therapy. Bone density will be ordered along with her mammogram in November.  Return to clinic every 3 weeks for Herceptin every 6 weeks to follow-up with [place]

## 2019-10-01 NOTE — Patient Instructions (Signed)
[name] Discharge Instructions for Patients Receiving Chemotherapy  Today you received the following chemotherapy agents: Hylecta  To help prevent nausea and vomiting after your treatment, we encourage you to take your nausea medication as prescribed.    If you develop nausea and vomiting that is not controlled by your nausea medication, call the clinic.   BELOW ARE SYMPTOMS THAT SHOULD BE REPORTED IMMEDIATELY:  *FEVER GREATER THAN 100.5 F  *CHILLS WITH OR WITHOUT FEVER  NAUSEA AND VOMITING THAT IS NOT CONTROLLED WITH YOUR NAUSEA MEDICATION  *UNUSUAL SHORTNESS OF BREATH  *UNUSUAL BRUISING OR BLEEDING  TENDERNESS IN MOUTH AND THROAT WITH OR WITHOUT PRESENCE OF ULCERS  *URINARY PROBLEMS  *BOWEL PROBLEMS  UNUSUAL RASH Items with * indicate a potential emergency and should be followed up as soon as possible.  Feel free to call the clinic should you have any questions or concerns. The clinic phone number is [phone].  Please show the [place] at check-in to the Emergency Department and triage nurse.

## 2019-10-03 ENCOUNTER — Telehealth: Payer: Self-pay | Admitting: Hematology and Oncology

## 2019-10-03 NOTE — Telephone Encounter (Signed)
Scheduled per 8/3 los. Pt will be given an updated appt calendar, per appt notes

## 2019-10-08 ENCOUNTER — Ambulatory Visit: Payer: Medicare HMO

## 2019-10-08 ENCOUNTER — Ambulatory Visit: Payer: Medicare HMO | Admitting: Hematology and Oncology

## 2019-10-08 ENCOUNTER — Other Ambulatory Visit: Payer: Medicare HMO

## 2019-10-21 ENCOUNTER — Encounter: Payer: Self-pay | Admitting: *Deleted

## 2019-10-22 ENCOUNTER — Other Ambulatory Visit: Payer: Self-pay

## 2019-10-22 ENCOUNTER — Inpatient Hospital Stay: Payer: Medicare HMO

## 2019-10-22 ENCOUNTER — Other Ambulatory Visit: Payer: Medicare HMO

## 2019-10-22 VITALS — BP 113/75 | HR 78 | Temp 98.4°F | Resp 16

## 2019-10-22 DIAGNOSIS — Z170 Estrogen receptor positive status [ER+]: Secondary | ICD-10-CM

## 2019-10-22 DIAGNOSIS — Z5112 Encounter for antineoplastic immunotherapy: Secondary | ICD-10-CM | POA: Diagnosis not present

## 2019-10-22 MED ORDER — TRASTUZUMAB-HYALURONIDASE-OYSK 600-10000 MG-UNT/5ML ~~LOC~~ SOLN
600.0000 mg | Freq: Once | SUBCUTANEOUS | Status: AC
  Administered 2019-10-22: 600 mg via SUBCUTANEOUS
  Filled 2019-10-22: qty 5

## 2019-10-22 MED ORDER — ACETAMINOPHEN 325 MG PO TABS
ORAL_TABLET | ORAL | Status: AC
  Filled 2019-10-22: qty 2

## 2019-10-22 MED ORDER — DIPHENHYDRAMINE HCL 25 MG PO CAPS
25.0000 mg | ORAL_CAPSULE | Freq: Once | ORAL | Status: AC
  Administered 2019-10-22: 25 mg via ORAL

## 2019-10-22 MED ORDER — ACETAMINOPHEN 325 MG PO TABS
650.0000 mg | ORAL_TABLET | Freq: Once | ORAL | Status: AC
  Administered 2019-10-22: 650 mg via ORAL

## 2019-10-22 MED ORDER — DIPHENHYDRAMINE HCL 25 MG PO CAPS
ORAL_CAPSULE | ORAL | Status: AC
  Filled 2019-10-22: qty 1

## 2019-10-22 NOTE — Patient Instructions (Signed)
[facility] Discharge Instructions for Patients Receiving Chemotherapy  Today you received the following chemotherapy agents: HYLECTA.  To help prevent nausea and vomiting after your treatment, we encourage you to take your nausea medication as directed.   If you develop nausea and vomiting that is not controlled by your nausea medication, call the clinic.   BELOW ARE SYMPTOMS THAT SHOULD BE REPORTED IMMEDIATELY:  *FEVER GREATER THAN 100.5 F  *CHILLS WITH OR WITHOUT FEVER  NAUSEA AND VOMITING THAT IS NOT CONTROLLED WITH YOUR NAUSEA MEDICATION  *UNUSUAL SHORTNESS OF BREATH  *UNUSUAL BRUISING OR BLEEDING  TENDERNESS IN MOUTH AND THROAT WITH OR WITHOUT PRESENCE OF ULCERS  *URINARY PROBLEMS  *BOWEL PROBLEMS  UNUSUAL RASH Items with * indicate a potential emergency and should be followed up as soon as possible.  Feel free to call the clinic should you have any questions or concerns. The clinic phone number is [phone].  Please show the [place] at check-in to the Emergency Department and triage nurse.

## 2019-10-29 ENCOUNTER — Ambulatory Visit: Payer: Medicare HMO

## 2019-11-11 ENCOUNTER — Other Ambulatory Visit: Payer: Self-pay | Admitting: *Deleted

## 2019-11-11 DIAGNOSIS — C50211 Malignant neoplasm of upper-inner quadrant of right female breast: Secondary | ICD-10-CM

## 2019-11-11 DIAGNOSIS — Z170 Estrogen receptor positive status [ER+]: Secondary | ICD-10-CM

## 2019-11-11 NOTE — Progress Notes (Signed)
Patient Care Team: Tonia Ghent, MD as PCP - General (Family Medicine) Monna Fam, MD as Consulting Physician (Ophthalmology) Laurence Spates, MD (Inactive) as Consulting Physician (Gastroenterology) Henreitta Leber, DDS as Referring Physician (Dentistry) Mauro Kaufmann, RN as Oncology Nurse Navigator Rockwell Germany, RN as Oncology Nurse Navigator  DIAGNOSIS:    ICD-10-CM   1. Malignant neoplasm of upper-inner quadrant of right breast in female, estrogen receptor positive ([name])  C50.211    Z17.0     SUMMARY OF ONCOLOGIC HISTORY: Oncology History  Malignant neoplasm of upper-inner quadrant of right breast in female, estrogen receptor positive ([name])  02/13/2019 Initial Diagnosis   Routine screening mammogram detected 1.5cm right breast mass, 2.3cm on diagnostic mammogram, at the 1 o'clock position. Biopsy showed IDC, grade 2, HER-2 + by FISH, ER+ 100%, PR+ 90%, Ki67 15%.    03/19/2019 Surgery   Right mastectomy [name]): IDC, 2.6cm, grade 2, clear margins, 6 lymph nodes negative for carcinoma.    03/28/2019 -  Anti-estrogen oral therapy   Anastrozole    04/16/2019 -  Chemotherapy   The patient had trastuzumab-hyaluronidase-oysk (HERCEPTIN HYLECTA) 600-10000 MG-UNT/5ML chemo SQ injection 600 mg, 600 mg, Subcutaneous,  Once, 10 of 16 cycles Administration: 600 mg (04/16/2019), 600 mg (05/07/2019), 600 mg (05/28/2019), 600 mg (06/18/2019), 600 mg (07/09/2019), 600 mg (07/31/2019), 600 mg (08/20/2019), 600 mg (09/09/2019), 600 mg (10/01/2019), 600 mg (10/22/2019)  for chemotherapy treatment.     Genetic Testing   Negative genetic testing. No pathogenic variants identified on the [facility] STAT Breast cancer panel + Common Hereditary Cancers Panel. VUS in BRCA2 called c.5378A>G and a VUS in MUTYH called c.925C>T identified. The report date is 02/28/2019.   The STAT Breast cancer panel offered by [facility] includes sequencing and rearrangement analysis for the following 9 genes:  ATM, BRCA1,  BRCA2, CDH1, CHEK2, PALB2, PTEN, STK11 and TP53.    The Common Hereditary Cancers Panel offered by [facility] includes sequencing and/or deletion duplication testing of the following 48 genes: APC, ATM, AXIN2, BARD1, BMPR1A, BRCA1, BRCA2, BRIP1, CDH1, CDKN2A (p14ARF), CDKN2A (p16INK4a), CKD4, CHEK2, CTNNA1, DICER1, EPCAM (Deletion/duplication testing only), GREM1 (promoter region deletion/duplication testing only), KIT, MEN1, MLH1, MSH2, MSH3, MSH6, MUTYH, NBN, NF1, NHTL1, PALB2, PDGFRA, PMS2, POLD1, POLE, PTEN, RAD50, RAD51C, RAD51D, RNF43, SDHB, SDHC, SDHD, SMAD4, SMARCA4. STK11, TP53, TSC1, TSC2, and VHL.  The following genes were evaluated for sequence changes only: SDHA and HOXB13 c.251G>A variant only.     CHIEF COMPLIANT: Follow-up of right breast cancer on Herceptin  INTERVAL HISTORY: Amber [name] is a 86 y.o. with above-mentioned history of right breast cancer who underwent a mastectomy, and is currently on adjuvant Herceptin and antiestrogen therapy with anastrozole.She presents to the clinic todayfor treatment. She gets 3-4 loose stools per day and sometimes she takes Imodium.  She has also noticed a more fatigue than prior treatments.  ALLERGIES:  is allergic to ace inhibitors, escitalopram oxalate, lipitor [atorvastatin calcium], zetia [ezetimibe], and zocor [simvastatin - high dose].  MEDICATIONS:  Current Outpatient Medications  Medication Sig Dispense Refill  . amLODipine (NORVASC) 10 MG tablet Take 1 tablet (10 mg total) by mouth daily. 90 tablet 3  . anastrozole (ARIMIDEX) 1 MG tablet Take 1 tablet (1 mg total) by mouth daily. 90 tablet 3  . aspirin 325 MG tablet Take 325 mg by mouth daily.    . Cholecalciferol 1000 units tablet Take 1 tablet (1,000 Units total) by mouth daily.    . Multiple Vitamin (MULTIVITAMIN) tablet Take 1  tablet by mouth daily.      Marland Kitchen omega-3 acid ethyl esters (LOVAZA) 1 g capsule Take 1 capsule (1 g total) by mouth daily. 90 capsule 3  . rosuvastatin  (CRESTOR) 5 MG tablet TAKE 1 TABLET BY MOUTH TWICE WEEKLY 24 tablet 1   No current facility-administered medications for this visit.    PHYSICAL EXAMINATION: ECOG PERFORMANCE STATUS: 1 - Symptomatic but completely ambulatory  Vitals:   11/12/19 0910  BP: 114/70  Pulse: 80  Resp: 17  Temp: (!) 97.1 F (36.2 C)  SpO2: 99%   Filed Weights   11/12/19 0910  Weight: 136 lb 9.6 oz (62 kg)    LABORATORY DATA:  I have reviewed the data as listed CMP Latest Ref Rng & Units 11/12/2019 10/01/2019 08/20/2019  Glucose 70 - 99 mg/dL 119(H) 109(H) 103(H)  BUN 8 - 23 mg/dL '14 16 17  ' Creatinine 0.44 - 1.00 mg/dL 0.93 1.23(H) 1.09(H)  Sodium 135 - 145 mmol/L 141 141 140  Potassium 3.5 - 5.1 mmol/L 3.8 4.0 4.4  Chloride 98 - 111 mmol/L 103 106 103  CO2 22 - 32 mmol/L '29 26 28  ' Calcium 8.9 - 10.3 mg/dL 9.8 10.0 9.8  Total Protein 6.5 - 8.1 g/dL 7.4 7.4 7.2  Total Bilirubin 0.3 - 1.2 mg/dL 0.6 0.4 0.5  Alkaline Phos 38 - 126 U/L 85 87 81  AST 15 - 41 U/L '17 16 15  ' ALT 0 - 44 U/L '10 12 17    ' Lab Results  Component Value Date   WBC 5.7 11/12/2019   HGB 13.8 11/12/2019   HCT 40.8 11/12/2019   MCV 90.1 11/12/2019   PLT 275 11/12/2019   NEUTROABS 3.0 11/12/2019    ASSESSMENT & PLAN:  Malignant neoplasm of upper-inner quadrant of right breast in female, estrogen receptor positive ([name]) 03/09/2019: Right mastectomy [name]): IDC, 2.6cm, grade 2, clear margins, 6 lymph nodes negative for carcinoma., ER 100%, PR 90%, HER-2 equivocal by IHC, FISH positive ratio 2.76, copy #6.2, Ki-67 15%  Treatment plan: 1. Adjuvant Herceptin every 3 weeks. Subcutaneously starting 04/16/2019 2. Adjuvant antiestrogen therapywith anastrozole 1 mg daily starting 04/16/2019  Echo on 03/13/19 normal: EF 60-65% Echo on 06/05/2019 normal: EF 60-65% Echo on 09/19/2019: EF 55 to 60%  DEXA 12/2017 shows T score of -0.8 in left total femur (normal). Herceptin toxicity: 1, 4-5 loose stools per day: I instructed  her to take probiotics and I sent a prescription for Florastor. 2. fatigue: Appears to be getting slowly worse with each treatment.  Anastrozole toxicities: Denies any major adverse effects to anastrozole therapy. Bone density will be ordered along with her mammogram in November.  Return to clinic every 3 weeks for Herceptin every 6 weeks to follow-up with [place] Even though Herceptin needs to go on until end of January, we decided to finish it on 02/25/2020 based on her personal wishes.    No orders of the defined types were placed in this encounter.  The patient has a good understanding of the overall plan. she agrees with it. she will call with any problems that may develop before the next visit here.  Total time spent: 30 mins including face to face time and time spent for planning, charting and coordination of care  Nicholas Lose, MD 11/12/2019  I, Cloyde Reams Dorshimer, am acting as scribe for Dr. Nicholas Lose.  I have reviewed the above documentation for accuracy and completeness, and I agree with the above.

## 2019-11-12 ENCOUNTER — Inpatient Hospital Stay: Payer: Medicare HMO

## 2019-11-12 ENCOUNTER — Other Ambulatory Visit: Payer: Self-pay

## 2019-11-12 ENCOUNTER — Encounter: Payer: Self-pay | Admitting: *Deleted

## 2019-11-12 ENCOUNTER — Inpatient Hospital Stay: Payer: Medicare HMO | Attending: Hematology and Oncology | Admitting: Hematology and Oncology

## 2019-11-12 DIAGNOSIS — Z170 Estrogen receptor positive status [ER+]: Secondary | ICD-10-CM

## 2019-11-12 DIAGNOSIS — Z79899 Other long term (current) drug therapy: Secondary | ICD-10-CM | POA: Diagnosis not present

## 2019-11-12 DIAGNOSIS — Z7982 Long term (current) use of aspirin: Secondary | ICD-10-CM | POA: Diagnosis not present

## 2019-11-12 DIAGNOSIS — C50211 Malignant neoplasm of upper-inner quadrant of right female breast: Secondary | ICD-10-CM | POA: Diagnosis not present

## 2019-11-12 DIAGNOSIS — Z9011 Acquired absence of right breast and nipple: Secondary | ICD-10-CM | POA: Insufficient documentation

## 2019-11-12 DIAGNOSIS — Z79811 Long term (current) use of aromatase inhibitors: Secondary | ICD-10-CM | POA: Insufficient documentation

## 2019-11-12 DIAGNOSIS — Z5112 Encounter for antineoplastic immunotherapy: Secondary | ICD-10-CM | POA: Diagnosis not present

## 2019-11-12 LAB — CMP (CANCER CENTER ONLY)
ALT: 10 U/L (ref 0–44)
AST: 17 U/L (ref 15–41)
Albumin: 3.7 g/dL (ref 3.5–5.0)
Alkaline Phosphatase: 85 U/L (ref 38–126)
Anion gap: 9 (ref 5–15)
BUN: 14 mg/dL (ref 8–23)
CO2: 29 mmol/L (ref 22–32)
Calcium: 9.8 mg/dL (ref 8.9–10.3)
Chloride: 103 mmol/L (ref 98–111)
Creatinine: 0.93 mg/dL (ref 0.44–1.00)
GFR, Est AFR Am: >60 mL/min (ref >60)
GFR, Estimated: 57 mL/min — ABNORMAL LOW (ref >60)
Glucose, Bld: 119 mg/dL — ABNORMAL HIGH (ref 70–99)
Potassium: 3.8 mmol/L (ref 3.5–5.1)
Sodium: 141 mmol/L (ref 135–145)
Total Bilirubin: 0.6 mg/dL (ref 0.3–1.2)
Total Protein: 7.4 g/dL (ref 6.5–8.1)

## 2019-11-12 LAB — CBC WITH DIFFERENTIAL (CANCER CENTER ONLY)
Abs Immature Granulocytes: 0.01 10*3/uL (ref 0.00–0.07)
Basophils Absolute: 0 10*3/uL (ref 0.0–0.1)
Basophils Relative: 1 %
Eosinophils Absolute: 0.2 10*3/uL (ref 0.0–0.5)
Eosinophils Relative: 4 %
HCT: 40.8 % (ref 36.0–46.0)
Hemoglobin: 13.8 g/dL (ref 12.0–15.0)
Immature Granulocytes: 0 %
Lymphocytes Relative: 37 %
Lymphs Abs: 2.1 10*3/uL (ref 0.7–4.0)
MCH: 30.5 pg (ref 26.0–34.0)
MCHC: 33.8 g/dL (ref 30.0–36.0)
MCV: 90.1 fL (ref 80.0–100.0)
Monocytes Absolute: 0.4 10*3/uL (ref 0.1–1.0)
Monocytes Relative: 6 %
Neutro Abs: 3 10*3/uL (ref 1.7–7.7)
Neutrophils Relative %: 52 %
Platelet Count: 275 10*3/uL (ref 150–400)
RBC: 4.53 MIL/uL (ref 3.87–5.11)
RDW: 12.1 % (ref 11.5–15.5)
WBC Count: 5.7 10*3/uL (ref 4.0–10.5)
nRBC: 0 % (ref 0.0–0.2)

## 2019-11-12 MED ORDER — DIPHENHYDRAMINE HCL 25 MG PO CAPS
ORAL_CAPSULE | ORAL | Status: AC
  Filled 2019-11-12: qty 1

## 2019-11-12 MED ORDER — DIPHENHYDRAMINE HCL 25 MG PO CAPS
25.0000 mg | ORAL_CAPSULE | Freq: Once | ORAL | Status: AC
  Administered 2019-11-12: 25 mg via ORAL

## 2019-11-12 MED ORDER — ACETAMINOPHEN 325 MG PO TABS
650.0000 mg | ORAL_TABLET | Freq: Once | ORAL | Status: AC
  Administered 2019-11-12: 650 mg via ORAL

## 2019-11-12 MED ORDER — SACCHAROMYCES BOULARDII 250 MG PO CAPS: 250.0000 mg | ORAL_CAPSULE | Freq: Two times a day (BID) | ORAL | 3 refills | Status: DC

## 2019-11-12 MED ORDER — TRASTUZUMAB-HYALURONIDASE-OYSK 600-10000 MG-UNT/5ML ~~LOC~~ SOLN
600.0000 mg | Freq: Once | SUBCUTANEOUS | Status: AC
  Administered 2019-11-12: 600 mg via SUBCUTANEOUS
  Filled 2019-11-12: qty 5

## 2019-11-12 MED ORDER — ACETAMINOPHEN 325 MG PO TABS
ORAL_TABLET | ORAL | Status: AC
  Filled 2019-11-12: qty 2

## 2019-11-12 NOTE — Patient Instructions (Signed)
[facility] Discharge Instructions for Patients Receiving Chemotherapy  Today you received the following chemotherapy agents: HYLECTA.  To help prevent nausea and vomiting after your treatment, we encourage you to take your nausea medication as directed.   If you develop nausea and vomiting that is not controlled by your nausea medication, call the clinic.   BELOW ARE SYMPTOMS THAT SHOULD BE REPORTED IMMEDIATELY:  *FEVER GREATER THAN 100.5 F  *CHILLS WITH OR WITHOUT FEVER  NAUSEA AND VOMITING THAT IS NOT CONTROLLED WITH YOUR NAUSEA MEDICATION  *UNUSUAL SHORTNESS OF BREATH  *UNUSUAL BRUISING OR BLEEDING  TENDERNESS IN MOUTH AND THROAT WITH OR WITHOUT PRESENCE OF ULCERS  *URINARY PROBLEMS  *BOWEL PROBLEMS  UNUSUAL RASH Items with * indicate a potential emergency and should be followed up as soon as possible.  Feel free to call the clinic should you have any questions or concerns. The clinic phone number is [phone].  Please show the [place] at check-in to the Emergency Department and triage nurse.

## 2019-11-12 NOTE — Assessment & Plan Note (Signed)
03/09/2019: Right mastectomy [name]): IDC, 2.6cm, grade 2, clear margins, 6 lymph nodes negative for carcinoma., ER 100%, PR 90%, HER-2 equivocal by IHC, FISH positive ratio 2.76, copy #6.2, Ki-67 15%  Treatment plan: 1. Adjuvant Herceptin every 3 weeks. Subcutaneously starting 04/16/2019 2. Adjuvant antiestrogen therapywith anastrozole 1 mg daily starting 04/16/2019  Echo on 03/13/19 normal: EF 60-65% Echo on 06/05/2019 normal: EF 60-65% Echo on 09/19/2019: EF 55 to 60%  DEXA 12/2017 shows T score of -0.8 in left total femur (normal).  Anastrozole toxicities: Denies any major adverse effects to anastrozole therapy. Bone density will be ordered along with her mammogram in November.  Return to clinic every 3 weeks for Herceptin every 6 weeks to follow-up with [place] Even though Herceptin needs to go on until end of January, we decided to finish it on 02/25/2020 based on her personal wishes.

## 2019-11-13 ENCOUNTER — Telehealth: Payer: Self-pay | Admitting: Hematology and Oncology

## 2019-11-13 NOTE — Telephone Encounter (Signed)
Scheduled per 9/14 los. Called spoke with pt, pt will receive an updated appt calendar, per next visit appt notes

## 2019-11-28 ENCOUNTER — Other Ambulatory Visit: Payer: Self-pay | Admitting: Family Medicine

## 2019-12-03 ENCOUNTER — Inpatient Hospital Stay: Payer: Medicare HMO | Attending: Hematology and Oncology

## 2019-12-03 ENCOUNTER — Other Ambulatory Visit: Payer: Self-pay

## 2019-12-03 VITALS — BP 122/64 | HR 79 | Temp 97.9°F | Resp 18

## 2019-12-03 DIAGNOSIS — Z7982 Long term (current) use of aspirin: Secondary | ICD-10-CM | POA: Diagnosis not present

## 2019-12-03 DIAGNOSIS — C50211 Malignant neoplasm of upper-inner quadrant of right female breast: Secondary | ICD-10-CM | POA: Diagnosis not present

## 2019-12-03 DIAGNOSIS — Z5112 Encounter for antineoplastic immunotherapy: Secondary | ICD-10-CM | POA: Diagnosis not present

## 2019-12-03 DIAGNOSIS — Z79811 Long term (current) use of aromatase inhibitors: Secondary | ICD-10-CM | POA: Insufficient documentation

## 2019-12-03 DIAGNOSIS — Z79899 Other long term (current) drug therapy: Secondary | ICD-10-CM | POA: Insufficient documentation

## 2019-12-03 DIAGNOSIS — Z9011 Acquired absence of right breast and nipple: Secondary | ICD-10-CM | POA: Insufficient documentation

## 2019-12-03 DIAGNOSIS — Z170 Estrogen receptor positive status [ER+]: Secondary | ICD-10-CM | POA: Insufficient documentation

## 2019-12-03 DIAGNOSIS — R5383 Other fatigue: Secondary | ICD-10-CM | POA: Diagnosis not present

## 2019-12-03 DIAGNOSIS — M2569 Stiffness of other specified joint, not elsewhere classified: Secondary | ICD-10-CM | POA: Diagnosis not present

## 2019-12-03 MED ORDER — DIPHENHYDRAMINE HCL 25 MG PO CAPS
ORAL_CAPSULE | ORAL | Status: AC
  Filled 2019-12-03: qty 1

## 2019-12-03 MED ORDER — ACETAMINOPHEN 325 MG PO TABS
650.0000 mg | ORAL_TABLET | Freq: Once | ORAL | Status: AC
  Administered 2019-12-03: 650 mg via ORAL

## 2019-12-03 MED ORDER — DIPHENHYDRAMINE HCL 25 MG PO CAPS
25.0000 mg | ORAL_CAPSULE | Freq: Once | ORAL | Status: AC
  Administered 2019-12-03: 25 mg via ORAL

## 2019-12-03 MED ORDER — ACETAMINOPHEN 325 MG PO TABS
ORAL_TABLET | ORAL | Status: AC
  Filled 2019-12-03: qty 2

## 2019-12-03 MED ORDER — TRASTUZUMAB-HYALURONIDASE-OYSK 600-10000 MG-UNT/5ML ~~LOC~~ SOLN
600.0000 mg | Freq: Once | SUBCUTANEOUS | Status: AC
  Administered 2019-12-03: 600 mg via SUBCUTANEOUS
  Filled 2019-12-03: qty 5

## 2019-12-10 DIAGNOSIS — R69 Illness, unspecified: Secondary | ICD-10-CM | POA: Diagnosis not present

## 2019-12-12 ENCOUNTER — Ambulatory Visit (HOSPITAL_COMMUNITY)
Admission: RE | Admit: 2019-12-12 | Discharge: 2019-12-12 | Disposition: A | Discharge status: Home / Self Care | Payer: Medicare HMO | Source: Ambulatory Visit | Attending: Hematology and Oncology | Admitting: Hematology and Oncology

## 2019-12-12 ENCOUNTER — Other Ambulatory Visit: Payer: Self-pay

## 2019-12-12 DIAGNOSIS — I10 Essential (primary) hypertension: Secondary | ICD-10-CM | POA: Insufficient documentation

## 2019-12-12 DIAGNOSIS — I351 Nonrheumatic aortic (valve) insufficiency: Secondary | ICD-10-CM | POA: Insufficient documentation

## 2019-12-12 DIAGNOSIS — Z170 Estrogen receptor positive status [ER+]: Secondary | ICD-10-CM | POA: Diagnosis not present

## 2019-12-12 DIAGNOSIS — Z0189 Encounter for other specified special examinations: Secondary | ICD-10-CM | POA: Diagnosis not present

## 2019-12-12 DIAGNOSIS — C50211 Malignant neoplasm of upper-inner quadrant of right female breast: Secondary | ICD-10-CM | POA: Insufficient documentation

## 2019-12-12 DIAGNOSIS — E785 Hyperlipidemia, unspecified: Secondary | ICD-10-CM | POA: Insufficient documentation

## 2019-12-12 LAB — ECHOCARDIOGRAM COMPLETE
Area-P 1/2: 3.72 cm2
MV M vel: 4.53 m/s
MV Peak grad: 82.1 mmHg
P 1/2 time: 483 msec
S' Lateral: 2.5 cm

## 2019-12-12 NOTE — Progress Notes (Signed)
°  Echocardiogram 2D Echocardiogram has been performed.  Jennette Dubin 12/12/2019, 9:50 AM

## 2019-12-23 ENCOUNTER — Encounter: Payer: Self-pay | Admitting: *Deleted

## 2019-12-23 ENCOUNTER — Other Ambulatory Visit: Payer: Self-pay | Admitting: *Deleted

## 2019-12-23 DIAGNOSIS — Z170 Estrogen receptor positive status [ER+]: Secondary | ICD-10-CM

## 2019-12-23 NOTE — Progress Notes (Signed)
Patient Care Team: Tonia Ghent, MD as PCP - General (Family Medicine) Monna Fam, MD as Consulting Physician (Ophthalmology) Laurence Spates, MD (Inactive) as Consulting Physician (Gastroenterology) Henreitta Leber, DDS as Referring Physician (Dentistry) Mauro Kaufmann, RN as Oncology Nurse Navigator Rockwell Germany, RN as Oncology Nurse Navigator  DIAGNOSIS:    ICD-10-CM   1. Malignant neoplasm of upper-inner quadrant of right breast in female, estrogen receptor positive ([place])  C50.211    Z17.0     SUMMARY OF ONCOLOGIC HISTORY: Oncology History  Malignant neoplasm of upper-inner quadrant of right breast in female, estrogen receptor positive ([place])  02/13/2019 Initial Diagnosis   Routine screening mammogram detected 1.5cm right breast mass, 2.3cm on diagnostic mammogram, at the 1 o'clock position. Biopsy showed IDC, grade 2, HER-2 + by FISH, ER+ 100%, PR+ 90%, Ki67 15%.    03/19/2019 Surgery   Right mastectomy [name]): IDC, 2.6cm, grade 2, clear margins, 6 lymph nodes negative for carcinoma.    03/28/2019 -  Anti-estrogen oral therapy   Anastrozole    04/16/2019 -  Chemotherapy   The patient had trastuzumab-hyaluronidase-oysk (HERCEPTIN HYLECTA) 600-10000 MG-UNT/5ML chemo SQ injection 600 mg, 600 mg, Subcutaneous,  Once, 12 of 16 cycles Administration: 600 mg (04/16/2019), 600 mg (05/07/2019), 600 mg (05/28/2019), 600 mg (06/18/2019), 600 mg (07/09/2019), 600 mg (07/31/2019), 600 mg (08/20/2019), 600 mg (09/09/2019), 600 mg (10/01/2019), 600 mg (10/22/2019), 600 mg (11/12/2019), 600 mg (12/03/2019)  for chemotherapy treatment.     Genetic Testing   Negative genetic testing. No pathogenic variants identified on the [facility] STAT Breast cancer panel + Common Hereditary Cancers Panel. VUS in BRCA2 called c.5378A>G and a VUS in MUTYH called c.925C>T identified. The report date is 02/28/2019.   The STAT Breast cancer panel offered by [facility] includes sequencing and rearrangement analysis  for the following 9 genes:  ATM, BRCA1, BRCA2, CDH1, CHEK2, PALB2, PTEN, STK11 and TP53.    The Common Hereditary Cancers Panel offered by [facility] includes sequencing and/or deletion duplication testing of the following 48 genes: APC, ATM, AXIN2, BARD1, BMPR1A, BRCA1, BRCA2, BRIP1, CDH1, CDKN2A (p14ARF), CDKN2A (p16INK4a), CKD4, CHEK2, CTNNA1, DICER1, EPCAM (Deletion/duplication testing only), GREM1 (promoter region deletion/duplication testing only), KIT, MEN1, MLH1, MSH2, MSH3, MSH6, MUTYH, NBN, NF1, NHTL1, PALB2, PDGFRA, PMS2, POLD1, POLE, PTEN, RAD50, RAD51C, RAD51D, RNF43, SDHB, SDHC, SDHD, SMAD4, SMARCA4. STK11, TP53, TSC1, TSC2, and VHL.  The following genes were evaluated for sequence changes only: SDHA and HOXB13 c.251G>A variant only.     CHIEF COMPLIANT: Follow-up of right breast cancer on Herceptin  INTERVAL HISTORY: Amber [name] is a 86 y.o. with above-mentioned history of right breast cancer who underwent a mastectomy, and is currently on adjuvant Herceptin and antiestrogen therapy with anastrozole.Echo on 12/12/19 showed an ejection fraction of 50-55%. She presents to the clinic todayfor treatment.  ALLERGIES:  is allergic to ace inhibitors, escitalopram oxalate, lipitor [atorvastatin calcium], zetia [ezetimibe], and zocor [simvastatin - high dose].  MEDICATIONS:  Current Outpatient Medications  Medication Sig Dispense Refill  . amLODipine (NORVASC) 10 MG tablet Take 1 tablet (10 mg total) by mouth daily. Please make an appointment for physical exam prior to further refills. 90 tablet 0  . anastrozole (ARIMIDEX) 1 MG tablet Take 1 tablet (1 mg total) by mouth daily. 90 tablet 3  . aspirin 325 MG tablet Take 325 mg by mouth daily.    . Cholecalciferol 1000 units tablet Take 1 tablet (1,000 Units total) by mouth daily.    . Multiple Vitamin (MULTIVITAMIN)  tablet Take 1 tablet by mouth daily.      Marland Kitchen omega-3 acid ethyl esters (LOVAZA) 1 g capsule Take 1 capsule (1 g total) by  mouth daily. 90 capsule 3  . rosuvastatin (CRESTOR) 5 MG tablet TAKE 1 TABLET BY MOUTH TWICE WEEKLY 24 tablet 1  . saccharomyces boulardii (FLORASTOR) 250 MG capsule Take 1 capsule (250 mg total) by mouth 2 (two) times daily. 60 capsule 3   No current facility-administered medications for this visit.    PHYSICAL EXAMINATION: ECOG PERFORMANCE STATUS: 1 - Symptomatic but completely ambulatory  Vitals:   12/24/19 1245  BP: 99/66  Pulse: 85  Resp: 18  Temp: 97.7 F (36.5 C)  SpO2: 99%   Filed Weights   12/24/19 1245  Weight: 137 lb 3.2 oz (62.2 kg)     LABORATORY DATA:  I have reviewed the data as listed CMP Latest Ref Rng & Units 11/12/2019 10/01/2019 08/20/2019  Glucose 70 - 99 mg/dL 119(H) 109(H) 103(H)  BUN 8 - 23 mg/dL $Remove'14 16 17  'bXLbSZk$ Creatinine 0.44 - 1.00 mg/dL 0.93 1.23(H) 1.09(H)  Sodium 135 - 145 mmol/L 141 141 140  Potassium 3.5 - 5.1 mmol/L 3.8 4.0 4.4  Chloride 98 - 111 mmol/L 103 106 103  CO2 22 - 32 mmol/L $RemoveB'29 26 28  'pBQFETmS$ Calcium 8.9 - 10.3 mg/dL 9.8 10.0 9.8  Total Protein 6.5 - 8.1 g/dL 7.4 7.4 7.2  Total Bilirubin 0.3 - 1.2 mg/dL 0.6 0.4 0.5  Alkaline Phos 38 - 126 U/L 85 87 81  AST 15 - 41 U/L $Remo'17 16 15  'VCwiB$ ALT 0 - 44 U/L $Remo'10 12 17    'vuYcn$ Lab Results  Component Value Date   WBC 7.4 12/24/2019   HGB 13.6 12/24/2019   HCT 39.9 12/24/2019   MCV 89.5 12/24/2019   PLT 286 12/24/2019   NEUTROABS 4.6 12/24/2019    ASSESSMENT & PLAN:  Malignant neoplasm of upper-inner quadrant of right breast in female, estrogen receptor positive ([place]) 03/09/2019: Right mastectomy [name]): IDC, 2.6cm, grade 2, clear margins, 6 lymph nodes negative for carcinoma., ER 100%, PR 90%, HER-2 equivocal by IHC, FISH positive ratio 2.76, copy #6.2, Ki-67 15%  Treatment plan: 1. Adjuvant Herceptin every 3 weeks. Subcutaneously starting 04/16/2019 2. Adjuvant antiestrogen therapywith anastrozole 1 mg daily starting 04/16/2019  Echo on 03/13/19 normal: EF 60-65% Echo on 06/05/2019 normal: EF  60-65% Echo on 09/19/2019: EF 55 to 60%  DEXA 12/2017 shows T score of -0.8 in left total femur (normal). Herceptin toxicity: 1, 4-5 loose stools per day: Much improved with the probiotics 2. fatigue: Appears to be getting slowly worse with each treatment.  Anastrozole toxicities: Joint stiffness Bone density will be ordered along with her mammogram in November.  Return to clinic every 3 weeks for Herceptin every 6 weeks to follow-up with [place] Even though Herceptin needs to go on until end of January, we decided to finish it on 02/25/2020 based on her personal wishes.    No orders of the defined types were placed in this encounter.  The patient has a good understanding of the overall plan. she agrees with it. she will call with any problems that may develop before the next visit here.  Total time spent: 30 mins including face to face time and time spent for planning, charting and coordination of care  Nicholas Lose, MD 12/24/2019  I, Cloyde Reams Dorshimer, am acting as scribe for Dr. Nicholas Lose.  I have reviewed the above documentation for accuracy and completeness, and  I agree with the above.       

## 2019-12-24 ENCOUNTER — Other Ambulatory Visit: Payer: Self-pay

## 2019-12-24 ENCOUNTER — Inpatient Hospital Stay (HOSPITAL_BASED_OUTPATIENT_CLINIC_OR_DEPARTMENT_OTHER): Payer: Medicare HMO | Admitting: Hematology and Oncology

## 2019-12-24 ENCOUNTER — Inpatient Hospital Stay: Payer: Medicare HMO

## 2019-12-24 DIAGNOSIS — Z170 Estrogen receptor positive status [ER+]: Secondary | ICD-10-CM

## 2019-12-24 DIAGNOSIS — C50211 Malignant neoplasm of upper-inner quadrant of right female breast: Secondary | ICD-10-CM | POA: Diagnosis not present

## 2019-12-24 DIAGNOSIS — Z5112 Encounter for antineoplastic immunotherapy: Secondary | ICD-10-CM | POA: Diagnosis not present

## 2019-12-24 LAB — CBC WITH DIFFERENTIAL (CANCER CENTER ONLY)
Abs Immature Granulocytes: 0.01 10*3/uL (ref 0.00–0.07)
Basophils Absolute: 0 10*3/uL (ref 0.0–0.1)
Basophils Relative: 1 %
Eosinophils Absolute: 0.1 10*3/uL (ref 0.0–0.5)
Eosinophils Relative: 2 %
HCT: 39.9 % (ref 36.0–46.0)
Hemoglobin: 13.6 g/dL (ref 12.0–15.0)
Immature Granulocytes: 0 %
Lymphocytes Relative: 28 %
Lymphs Abs: 2.1 10*3/uL (ref 0.7–4.0)
MCH: 30.5 pg (ref 26.0–34.0)
MCHC: 34.1 g/dL (ref 30.0–36.0)
MCV: 89.5 fL (ref 80.0–100.0)
Monocytes Absolute: 0.6 10*3/uL (ref 0.1–1.0)
Monocytes Relative: 8 %
Neutro Abs: 4.6 10*3/uL (ref 1.7–7.7)
Neutrophils Relative %: 61 %
Platelet Count: 286 10*3/uL (ref 150–400)
RBC: 4.46 MIL/uL (ref 3.87–5.11)
RDW: 12.5 % (ref 11.5–15.5)
WBC Count: 7.4 10*3/uL (ref 4.0–10.5)
nRBC: 0 % (ref 0.0–0.2)

## 2019-12-24 LAB — CMP (CANCER CENTER ONLY)
ALT: 14 U/L (ref 0–44)
AST: 16 U/L (ref 15–41)
Albumin: 3.9 g/dL (ref 3.5–5.0)
Alkaline Phosphatase: 85 U/L (ref 38–126)
Anion gap: 6 (ref 5–15)
BUN: 16 mg/dL (ref 8–23)
CO2: 30 mmol/L (ref 22–32)
Calcium: 9.9 mg/dL (ref 8.9–10.3)
Chloride: 102 mmol/L (ref 98–111)
Creatinine: 1.18 mg/dL — ABNORMAL HIGH (ref 0.44–1.00)
GFR, Estimated: 46 mL/min — ABNORMAL LOW (ref >60)
Glucose, Bld: 89 mg/dL (ref 70–99)
Potassium: 3.7 mmol/L (ref 3.5–5.1)
Sodium: 138 mmol/L (ref 135–145)
Total Bilirubin: 0.5 mg/dL (ref 0.3–1.2)
Total Protein: 7.3 g/dL (ref 6.5–8.1)

## 2019-12-24 MED ORDER — ACETAMINOPHEN 325 MG PO TABS
ORAL_TABLET | ORAL | Status: AC
  Filled 2019-12-24: qty 2

## 2019-12-24 MED ORDER — TRASTUZUMAB-HYALURONIDASE-OYSK 600-10000 MG-UNT/5ML ~~LOC~~ SOLN
600.0000 mg | Freq: Once | SUBCUTANEOUS | Status: AC
  Administered 2019-12-24: 600 mg via SUBCUTANEOUS
  Filled 2019-12-24: qty 5

## 2019-12-24 MED ORDER — DIPHENHYDRAMINE HCL 25 MG PO CAPS
ORAL_CAPSULE | ORAL | Status: AC
  Filled 2019-12-24: qty 1

## 2019-12-24 MED ORDER — DIPHENHYDRAMINE HCL 25 MG PO CAPS
25.0000 mg | ORAL_CAPSULE | Freq: Once | ORAL | Status: AC
  Administered 2019-12-24: 25 mg via ORAL

## 2019-12-24 MED ORDER — ACETAMINOPHEN 325 MG PO TABS
650.0000 mg | ORAL_TABLET | Freq: Once | ORAL | Status: AC
  Administered 2019-12-24: 650 mg via ORAL

## 2019-12-24 NOTE — Patient Instructions (Signed)
[facility] Discharge Instructions for Patients Receiving Chemotherapy  Today you received the following chemotherapy agents herceptin hylecta   To help prevent nausea and vomiting after your treatment, we encourage you to take your nausea medication as directed.    If you develop nausea and vomiting that is not controlled by your nausea medication, call the clinic.   BELOW ARE SYMPTOMS THAT SHOULD BE REPORTED IMMEDIATELY:  *FEVER GREATER THAN 100.5 F  *CHILLS WITH OR WITHOUT FEVER  NAUSEA AND VOMITING THAT IS NOT CONTROLLED WITH YOUR NAUSEA MEDICATION  *UNUSUAL SHORTNESS OF BREATH  *UNUSUAL BRUISING OR BLEEDING  TENDERNESS IN MOUTH AND THROAT WITH OR WITHOUT PRESENCE OF ULCERS  *URINARY PROBLEMS  *BOWEL PROBLEMS  UNUSUAL RASH Items with * indicate a potential emergency and should be followed up as soon as possible.  Feel free to call the clinic should you have any questions or concerns. The clinic phone number is [phone].  Please show the CHEMO ALERT CARD at check-in to the Emergency Department and triage nurse.   

## 2019-12-24 NOTE — Assessment & Plan Note (Signed)
03/09/2019: Right mastectomy [name]): IDC, 2.6cm, grade 2, clear margins, 6 lymph nodes negative for carcinoma., ER 100%, PR 90%, HER-2 equivocal by IHC, FISH positive ratio 2.76, copy #6.2, Ki-67 15%  Treatment plan: 1. Adjuvant Herceptin every 3 weeks. Subcutaneously starting 04/16/2019 2. Adjuvant antiestrogen therapywith anastrozole 1 mg daily starting 04/16/2019  Echo on 03/13/19 normal: EF 60-65% Echo on 06/05/2019 normal: EF 60-65% Echo on 09/19/2019: EF 55 to 60%  DEXA 12/2017 shows T score of -0.8 in left total femur (normal). Herceptin toxicity: 1, 4-5 loose stools per day: I instructed her to take probiotics and I sent a prescription for Florastor. 2. fatigue: Appears to be getting slowly worse with each treatment.  Anastrozole toxicities: Denies any major adverse effects to anastrozole therapy. Bone density will be ordered along with her mammogram in November.  Return to clinic every 3 weeks for Herceptin every 6 weeks to follow-up with [place] Even though Herceptin needs to go on until end of January, we decided to finish it on 02/25/2020 based on her personal wishes.

## 2019-12-27 ENCOUNTER — Telehealth: Payer: Self-pay | Admitting: Hematology and Oncology

## 2019-12-27 NOTE — Telephone Encounter (Signed)
No 10/26 los, no changes made to pt schedule

## 2020-01-13 ENCOUNTER — Encounter: Payer: Self-pay | Admitting: Family Medicine

## 2020-01-13 DIAGNOSIS — M85851 Other specified disorders of bone density and structure, right thigh: Secondary | ICD-10-CM | POA: Diagnosis not present

## 2020-01-13 DIAGNOSIS — R928 Other abnormal and inconclusive findings on diagnostic imaging of breast: Secondary | ICD-10-CM | POA: Diagnosis not present

## 2020-01-13 DIAGNOSIS — Z853 Personal history of malignant neoplasm of breast: Secondary | ICD-10-CM | POA: Diagnosis not present

## 2020-01-13 DIAGNOSIS — M85852 Other specified disorders of bone density and structure, left thigh: Secondary | ICD-10-CM | POA: Diagnosis not present

## 2020-01-14 ENCOUNTER — Other Ambulatory Visit: Payer: Self-pay

## 2020-01-14 ENCOUNTER — Inpatient Hospital Stay: Payer: Medicare HMO | Attending: Hematology and Oncology

## 2020-01-14 VITALS — BP 143/76 | HR 80 | Temp 97.4°F | Resp 18

## 2020-01-14 DIAGNOSIS — Z170 Estrogen receptor positive status [ER+]: Secondary | ICD-10-CM | POA: Diagnosis not present

## 2020-01-14 DIAGNOSIS — C50211 Malignant neoplasm of upper-inner quadrant of right female breast: Secondary | ICD-10-CM | POA: Diagnosis not present

## 2020-01-14 DIAGNOSIS — Z5112 Encounter for antineoplastic immunotherapy: Secondary | ICD-10-CM | POA: Diagnosis present

## 2020-01-14 MED ORDER — DIPHENHYDRAMINE HCL 25 MG PO CAPS
ORAL_CAPSULE | ORAL | Status: AC
  Filled 2020-01-14: qty 1

## 2020-01-14 MED ORDER — TRASTUZUMAB-HYALURONIDASE-OYSK 600-10000 MG-UNT/5ML ~~LOC~~ SOLN
600.0000 mg | Freq: Once | SUBCUTANEOUS | Status: AC
  Administered 2020-01-14: 600 mg via SUBCUTANEOUS
  Filled 2020-01-14: qty 5

## 2020-01-14 MED ORDER — ACETAMINOPHEN 325 MG PO TABS
650.0000 mg | ORAL_TABLET | Freq: Once | ORAL | Status: AC
  Administered 2020-01-14: 650 mg via ORAL

## 2020-01-14 MED ORDER — DIPHENHYDRAMINE HCL 25 MG PO CAPS
25.0000 mg | ORAL_CAPSULE | Freq: Once | ORAL | Status: AC
  Administered 2020-01-14: 25 mg via ORAL

## 2020-01-14 MED ORDER — ACETAMINOPHEN 325 MG PO TABS
ORAL_TABLET | ORAL | Status: AC
  Filled 2020-01-14: qty 2

## 2020-01-14 NOTE — Patient Instructions (Signed)
[facility] Discharge Instructions for Patients Receiving Chemotherapy  Today you received the following chemotherapy agents herceptin hylecta   To help prevent nausea and vomiting after your treatment, we encourage you to take your nausea medication as directed.    If you develop nausea and vomiting that is not controlled by your nausea medication, call the clinic.   BELOW ARE SYMPTOMS THAT SHOULD BE REPORTED IMMEDIATELY:  *FEVER GREATER THAN 100.5 F  *CHILLS WITH OR WITHOUT FEVER  NAUSEA AND VOMITING THAT IS NOT CONTROLLED WITH YOUR NAUSEA MEDICATION  *UNUSUAL SHORTNESS OF BREATH  *UNUSUAL BRUISING OR BLEEDING  TENDERNESS IN MOUTH AND THROAT WITH OR WITHOUT PRESENCE OF ULCERS  *URINARY PROBLEMS  *BOWEL PROBLEMS  UNUSUAL RASH Items with * indicate a potential emergency and should be followed up as soon as possible.  Feel free to call the clinic should you have any questions or concerns. The clinic phone number is [phone].  Please show the CHEMO ALERT CARD at check-in to the Emergency Department and triage nurse.   

## 2020-01-15 DIAGNOSIS — R69 Illness, unspecified: Secondary | ICD-10-CM | POA: Diagnosis not present

## 2020-01-27 ENCOUNTER — Telehealth: Payer: Self-pay

## 2020-01-27 NOTE — Telephone Encounter (Signed)
Called and spoke with patient regarding her Bone Density Scan and Mammogram results per Dr. Damita Dunnings. Damita Dunnings, MD suggested recheck of Bone Density Scan in two years and to continue Vitamin D supplement. Patient verbalized understanding. Mammogram results placed in chart and Bone Density Scan results placed in scan folder. Will route to Dr. Lindi Adie per Dr. Damita Dunnings.

## 2020-02-03 ENCOUNTER — Other Ambulatory Visit: Payer: Self-pay | Admitting: *Deleted

## 2020-02-03 DIAGNOSIS — Z170 Estrogen receptor positive status [ER+]: Secondary | ICD-10-CM

## 2020-02-04 ENCOUNTER — Encounter: Payer: Self-pay | Admitting: *Deleted

## 2020-02-04 ENCOUNTER — Other Ambulatory Visit: Payer: Self-pay

## 2020-02-04 ENCOUNTER — Inpatient Hospital Stay: Payer: Medicare HMO

## 2020-02-04 ENCOUNTER — Inpatient Hospital Stay: Payer: Medicare HMO | Attending: Hematology and Oncology

## 2020-02-04 ENCOUNTER — Inpatient Hospital Stay: Payer: Medicare HMO | Admitting: Hematology and Oncology

## 2020-02-04 VITALS — BP 131/76 | HR 75 | Temp 98.4°F | Resp 18 | Ht 63.0 in | Wt 137.2 lb

## 2020-02-04 DIAGNOSIS — Z5112 Encounter for antineoplastic immunotherapy: Secondary | ICD-10-CM | POA: Insufficient documentation

## 2020-02-04 DIAGNOSIS — Z170 Estrogen receptor positive status [ER+]: Secondary | ICD-10-CM

## 2020-02-04 DIAGNOSIS — C50211 Malignant neoplasm of upper-inner quadrant of right female breast: Secondary | ICD-10-CM | POA: Insufficient documentation

## 2020-02-04 DIAGNOSIS — C170 Malignant neoplasm of duodenum: Secondary | ICD-10-CM | POA: Insufficient documentation

## 2020-02-04 LAB — CBC WITH DIFFERENTIAL (CANCER CENTER ONLY)
Abs Immature Granulocytes: 0.01 10*3/uL (ref 0.00–0.07)
Basophils Absolute: 0 10*3/uL (ref 0.0–0.1)
Basophils Relative: 0 %
Eosinophils Absolute: 0.2 10*3/uL (ref 0.0–0.5)
Eosinophils Relative: 2 %
HCT: 37.9 % (ref 36.0–46.0)
Hemoglobin: 12.9 g/dL (ref 12.0–15.0)
Immature Granulocytes: 0 %
Lymphocytes Relative: 29 %
Lymphs Abs: 2.3 10*3/uL (ref 0.7–4.0)
MCH: 30.6 pg (ref 26.0–34.0)
MCHC: 34 g/dL (ref 30.0–36.0)
MCV: 89.8 fL (ref 80.0–100.0)
Monocytes Absolute: 0.6 10*3/uL (ref 0.1–1.0)
Monocytes Relative: 8 %
Neutro Abs: 4.8 10*3/uL (ref 1.7–7.7)
Neutrophils Relative %: 61 %
Platelet Count: 266 10*3/uL (ref 150–400)
RBC: 4.22 MIL/uL (ref 3.87–5.11)
RDW: 12.4 % (ref 11.5–15.5)
WBC Count: 8 10*3/uL (ref 4.0–10.5)
nRBC: 0 % (ref 0.0–0.2)

## 2020-02-04 LAB — CMP (CANCER CENTER ONLY)
ALT: 13 U/L (ref 0–44)
AST: 15 U/L (ref 15–41)
Albumin: 3.6 g/dL (ref 3.5–5.0)
Alkaline Phosphatase: 85 U/L (ref 38–126)
Anion gap: 7 (ref 5–15)
BUN: 14 mg/dL (ref 8–23)
CO2: 30 mmol/L (ref 22–32)
Calcium: 9.6 mg/dL (ref 8.9–10.3)
Chloride: 104 mmol/L (ref 98–111)
Creatinine: 1.01 mg/dL — ABNORMAL HIGH (ref 0.44–1.00)
GFR, Estimated: 55 mL/min — ABNORMAL LOW (ref >60)
Glucose, Bld: 134 mg/dL — ABNORMAL HIGH (ref 70–99)
Potassium: 3.9 mmol/L (ref 3.5–5.1)
Sodium: 141 mmol/L (ref 135–145)
Total Bilirubin: 0.4 mg/dL (ref 0.3–1.2)
Total Protein: 7.1 g/dL (ref 6.5–8.1)

## 2020-02-04 MED ORDER — ACETAMINOPHEN 325 MG PO TABS
ORAL_TABLET | ORAL | Status: AC
  Filled 2020-02-04: qty 2

## 2020-02-04 MED ORDER — ACETAMINOPHEN 325 MG PO TABS
650.0000 mg | ORAL_TABLET | Freq: Once | ORAL | Status: AC
  Administered 2020-02-04: 650 mg via ORAL

## 2020-02-04 MED ORDER — DIPHENHYDRAMINE HCL 25 MG PO CAPS
25.0000 mg | ORAL_CAPSULE | Freq: Once | ORAL | Status: AC
  Administered 2020-02-04: 25 mg via ORAL

## 2020-02-04 MED ORDER — TRASTUZUMAB-HYALURONIDASE-OYSK 600-10000 MG-UNT/5ML ~~LOC~~ SOLN
600.0000 mg | Freq: Once | SUBCUTANEOUS | Status: AC
  Administered 2020-02-04: 600 mg via SUBCUTANEOUS
  Filled 2020-02-04: qty 5

## 2020-02-04 MED ORDER — DIPHENHYDRAMINE HCL 25 MG PO CAPS
ORAL_CAPSULE | ORAL | Status: AC
  Filled 2020-02-04: qty 2

## 2020-02-04 NOTE — Patient Instructions (Signed)
[facility] Discharge Instructions for Patients Receiving Chemotherapy  Today you received the following chemotherapy agents Trastuzumab-hyaluronidase-osyk  To help prevent nausea and vomiting after your treatment, we encourage you to take your nausea medication as directed.    If you develop nausea and vomiting that is not controlled by your nausea medication, call the clinic.   BELOW ARE SYMPTOMS THAT SHOULD BE REPORTED IMMEDIATELY:  *FEVER GREATER THAN 100.5 F  *CHILLS WITH OR WITHOUT FEVER  NAUSEA AND VOMITING THAT IS NOT CONTROLLED WITH YOUR NAUSEA MEDICATION  *UNUSUAL SHORTNESS OF BREATH  *UNUSUAL BRUISING OR BLEEDING  TENDERNESS IN MOUTH AND THROAT WITH OR WITHOUT PRESENCE OF ULCERS  *URINARY PROBLEMS  *BOWEL PROBLEMS  UNUSUAL RASH Items with * indicate a potential emergency and should be followed up as soon as possible.  Feel free to call the clinic should you have any questions or concerns. The clinic phone number is [phone].  Please show the [place] at check-in to the Emergency Department and triage nurse.

## 2020-02-04 NOTE — Progress Notes (Signed)
Pt stable and ambulatory at discharge

## 2020-02-18 ENCOUNTER — Other Ambulatory Visit: Payer: Self-pay | Admitting: Family Medicine

## 2020-02-19 NOTE — Telephone Encounter (Signed)
Pharmacy requests refill on: Amlodipine 10 mg   LAST REFILL: 11/28/2019 (Q-90, R-0) LAST OV: 10/04/2018 NEXT OV: Not Scheduled  PHARMACY: [id number] [place], [place]   NEEDS TO MAKE A CPE WITH DR. Damita Dunnings FOR FURTHER REFILLS.

## 2020-02-25 ENCOUNTER — Other Ambulatory Visit: Payer: Self-pay

## 2020-02-25 ENCOUNTER — Inpatient Hospital Stay: Payer: Medicare HMO

## 2020-02-25 ENCOUNTER — Encounter: Payer: Self-pay | Admitting: *Deleted

## 2020-02-25 VITALS — BP 114/70 | HR 77 | Temp 98.3°F | Resp 18 | Ht 63.0 in | Wt 134.0 lb

## 2020-02-25 DIAGNOSIS — Z5112 Encounter for antineoplastic immunotherapy: Secondary | ICD-10-CM | POA: Diagnosis not present

## 2020-02-25 DIAGNOSIS — Z170 Estrogen receptor positive status [ER+]: Secondary | ICD-10-CM

## 2020-02-25 LAB — CBC WITH DIFFERENTIAL (CANCER CENTER ONLY)
Abs Immature Granulocytes: 0.02 10*3/uL (ref 0.00–0.07)
Basophils Absolute: 0 10*3/uL (ref 0.0–0.1)
Basophils Relative: 0 %
Eosinophils Absolute: 0.2 10*3/uL (ref 0.0–0.5)
Eosinophils Relative: 3 %
HCT: 40.1 % (ref 36.0–46.0)
Hemoglobin: 13.8 g/dL (ref 12.0–15.0)
Immature Granulocytes: 0 %
Lymphocytes Relative: 26 %
Lymphs Abs: 1.9 10*3/uL (ref 0.7–4.0)
MCH: 30.8 pg (ref 26.0–34.0)
MCHC: 34.4 g/dL (ref 30.0–36.0)
MCV: 89.5 fL (ref 80.0–100.0)
Monocytes Absolute: 0.5 10*3/uL (ref 0.1–1.0)
Monocytes Relative: 7 %
Neutro Abs: 4.7 10*3/uL (ref 1.7–7.7)
Neutrophils Relative %: 64 %
Platelet Count: 314 10*3/uL (ref 150–400)
RBC: 4.48 MIL/uL (ref 3.87–5.11)
RDW: 12.3 % (ref 11.5–15.5)
WBC Count: 7.3 10*3/uL (ref 4.0–10.5)
nRBC: 0 % (ref 0.0–0.2)

## 2020-02-25 LAB — CMP (CANCER CENTER ONLY)
ALT: 12 U/L (ref 0–44)
AST: 16 U/L (ref 15–41)
Albumin: 3.8 g/dL (ref 3.5–5.0)
Alkaline Phosphatase: 84 U/L (ref 38–126)
Anion gap: 6 (ref 5–15)
BUN: 12 mg/dL (ref 8–23)
CO2: 30 mmol/L (ref 22–32)
Calcium: 9.5 mg/dL (ref 8.9–10.3)
Chloride: 105 mmol/L (ref 98–111)
Creatinine: 1.15 mg/dL — ABNORMAL HIGH (ref 0.44–1.00)
GFR, Estimated: 47 mL/min — ABNORMAL LOW (ref >60)
Glucose, Bld: 124 mg/dL — ABNORMAL HIGH (ref 70–99)
Potassium: 3.6 mmol/L (ref 3.5–5.1)
Sodium: 141 mmol/L (ref 135–145)
Total Bilirubin: 0.4 mg/dL (ref 0.3–1.2)
Total Protein: 7.4 g/dL (ref 6.5–8.1)

## 2020-02-25 MED ORDER — TRASTUZUMAB-HYALURONIDASE-OYSK 600-10000 MG-UNT/5ML ~~LOC~~ SOLN
600.0000 mg | Freq: Once | SUBCUTANEOUS | Status: AC
  Administered 2020-02-25: 600 mg via SUBCUTANEOUS
  Filled 2020-02-25: qty 5

## 2020-02-25 MED ORDER — ACETAMINOPHEN 325 MG PO TABS
650.0000 mg | ORAL_TABLET | Freq: Once | ORAL | Status: AC
  Administered 2020-02-25: 650 mg via ORAL

## 2020-02-25 MED ORDER — DIPHENHYDRAMINE HCL 25 MG PO CAPS
25.0000 mg | ORAL_CAPSULE | Freq: Once | ORAL | Status: AC
  Administered 2020-02-25: 25 mg via ORAL

## 2020-02-25 MED ORDER — ACETAMINOPHEN 325 MG PO TABS
ORAL_TABLET | ORAL | Status: AC
  Filled 2020-02-25: qty 2

## 2020-02-25 MED ORDER — DIPHENHYDRAMINE HCL 25 MG PO CAPS
ORAL_CAPSULE | ORAL | Status: AC
  Filled 2020-02-25: qty 1

## 2020-02-25 NOTE — Patient Instructions (Signed)
[facility] Discharge Instructions for Patients Receiving Chemotherapy  Today you received the following chemotherapy agents Trastuzumab-hyaludronidase-oysk (HERCEPTIN HYLECTA).  To help prevent nausea and vomiting after your treatment, we encourage you to take your nausea medication as prescribed.   If you develop nausea and vomiting that is not controlled by your nausea medication, call the clinic.   BELOW ARE SYMPTOMS THAT SHOULD BE REPORTED IMMEDIATELY:  *FEVER GREATER THAN 100.5 F  *CHILLS WITH OR WITHOUT FEVER  NAUSEA AND VOMITING THAT IS NOT CONTROLLED WITH YOUR NAUSEA MEDICATION  *UNUSUAL SHORTNESS OF BREATH  *UNUSUAL BRUISING OR BLEEDING  TENDERNESS IN MOUTH AND THROAT WITH OR WITHOUT PRESENCE OF ULCERS  *URINARY PROBLEMS  *BOWEL PROBLEMS  UNUSUAL RASH Items with * indicate a potential emergency and should be followed up as soon as possible.  Feel free to call the clinic should you have any questions or concerns. The clinic phone number is [phone].  Please show the CHEMO ALERT CARD at check-in to the Emergency Department and triage nurse.

## 2020-03-02 NOTE — Progress Notes (Signed)
Patient Care Team: Tonia Ghent, MD as PCP - General (Family Medicine) Monna Fam, MD as Consulting Physician (Ophthalmology) Laurence Spates, MD (Inactive) as Consulting Physician (Gastroenterology) Henreitta Leber, DDS as Referring Physician (Dentistry) Mauro Kaufmann, RN as Oncology Nurse Navigator Rockwell Germany, RN as Oncology Nurse Navigator  DIAGNOSIS:    ICD-10-CM   1. Malignant neoplasm of upper-inner quadrant of right breast in female, estrogen receptor positive ([place])  C50.211    Z17.0     SUMMARY OF ONCOLOGIC HISTORY: Oncology History  Malignant neoplasm of upper-inner quadrant of right breast in female, estrogen receptor positive ([place])  02/13/2019 Initial Diagnosis   Routine screening mammogram detected 1.5cm right breast mass, 2.3cm on diagnostic mammogram, at the 1 o'clock position. Biopsy showed IDC, grade 2, HER-2 + by FISH, ER+ 100%, PR+ 90%, Ki67 15%.    03/19/2019 Surgery   Right mastectomy [name]): IDC, 2.6cm, grade 2, clear margins, 6 lymph nodes negative for carcinoma.    03/28/2019 -  Anti-estrogen oral therapy   Anastrozole    04/16/2019 -  Chemotherapy   The patient had trastuzumab-hyaluronidase-oysk (HERCEPTIN HYLECTA) 600-10000 MG-UNT/5ML chemo SQ injection 600 mg, 600 mg, Subcutaneous,  Once, 16 of 16 cycles Administration: 600 mg (04/16/2019), 600 mg (05/07/2019), 600 mg (05/28/2019), 600 mg (06/18/2019), 600 mg (07/09/2019), 600 mg (07/31/2019), 600 mg (08/20/2019), 600 mg (09/09/2019), 600 mg (10/01/2019), 600 mg (10/22/2019), 600 mg (11/12/2019), 600 mg (12/03/2019), 600 mg (12/24/2019), 600 mg (01/14/2020), 600 mg (02/04/2020), 600 mg (02/25/2020)  for chemotherapy treatment.     Genetic Testing   Negative genetic testing. No pathogenic variants identified on the [facility] STAT Breast cancer panel + Common Hereditary Cancers Panel. VUS in BRCA2 called c.5378A>G and a VUS in MUTYH called c.925C>T identified. The report date is 02/28/2019.   The STAT  Breast cancer panel offered by [facility] includes sequencing and rearrangement analysis for the following 9 genes:  ATM, BRCA1, BRCA2, CDH1, CHEK2, PALB2, PTEN, STK11 and TP53.    The Common Hereditary Cancers Panel offered by [facility] includes sequencing and/or deletion duplication testing of the following 48 genes: APC, ATM, AXIN2, BARD1, BMPR1A, BRCA1, BRCA2, BRIP1, CDH1, CDKN2A (p14ARF), CDKN2A (p16INK4a), CKD4, CHEK2, CTNNA1, DICER1, EPCAM (Deletion/duplication testing only), GREM1 (promoter region deletion/duplication testing only), KIT, MEN1, MLH1, MSH2, MSH3, MSH6, MUTYH, NBN, NF1, NHTL1, PALB2, PDGFRA, PMS2, POLD1, POLE, PTEN, RAD50, RAD51C, RAD51D, RNF43, SDHB, SDHC, SDHD, SMAD4, SMARCA4. STK11, TP53, TSC1, TSC2, and VHL.  The following genes were evaluated for sequence changes only: SDHA and HOXB13 c.251G>A variant only.     CHIEF COMPLIANT: Follow-up of right breast cancer  INTERVAL HISTORY: Amber [name] is a 87 y.o. with above-mentioned history of right breast cancer who underwent a mastectomy, and is currently on adjuvant Herceptin and antiestrogen therapy with anastrozole.She presents to the clinic todayfor treatment.  She is tolerating anastrozole extremely well without any problems or concerns.  She does have occasional joint stiffness.  She no longer has any side effects from Herceptin.  ALLERGIES:  is allergic to ace inhibitors, escitalopram oxalate, lipitor [atorvastatin calcium], zetia [ezetimibe], and zocor [simvastatin - high dose].  MEDICATIONS:  Current Outpatient Medications  Medication Sig Dispense Refill  . amLODipine (NORVASC) 10 MG tablet TAKE 1 TABLET BY MOUTH EVERY DAY NEEDS APPT FOR REFILLS 30 tablet 0  . anastrozole (ARIMIDEX) 1 MG tablet Take 1 tablet (1 mg total) by mouth daily. 90 tablet 3  . aspirin 325 MG tablet Take 325 mg by mouth daily.    Marland Kitchen  Cholecalciferol 1000 units tablet Take 1 tablet (1,000 Units total) by mouth daily.    . Multiple Vitamin  (MULTIVITAMIN) tablet Take 1 tablet by mouth daily.      Marland Kitchen omega-3 acid ethyl esters (LOVAZA) 1 g capsule Take 1 capsule (1 g total) by mouth daily. 90 capsule 3  . rosuvastatin (CRESTOR) 5 MG tablet TAKE 1 TABLET BY MOUTH TWICE WEEKLY 24 tablet 1  . saccharomyces boulardii (FLORASTOR) 250 MG capsule Take 1 capsule (250 mg total) by mouth 2 (two) times daily. 60 capsule 3   No current facility-administered medications for this visit.    PHYSICAL EXAMINATION: ECOG PERFORMANCE STATUS: 1 - Symptomatic but completely ambulatory  Vitals:   03/03/20 1214  BP: 126/67  Pulse: 81  Resp: 17  Temp: 98.2 F (36.8 C)  SpO2: 97%   Filed Weights   03/03/20 1214  Weight: 134 lb 9.6 oz (61.1 kg)    LABORATORY DATA:  I have reviewed the data as listed CMP Latest Ref Rng & Units 02/25/2020 02/04/2020 12/24/2019  Glucose 70 - 99 mg/dL 124(H) 134(H) 89  BUN 8 - 23 mg/dL _0 Creatinine 0.44 - 1.00 mg/dL 1.15(H) 1.01(H) 1.18(H)  Sodium 135 - 145 mmol/L 141 141 138  Potassium 3.5 - 5.1 mmol/L 3.6 3.9 3.7  Chloride 98 - 111 mmol/L 105 104 102  CO2 22 - 32 mmol/L _1 Calcium 8.9 - 10.3 mg/dL 9.5 9.6 9.9  Total Protein 6.5 - 8.1 g/dL 7.4 7.1 7.3  Total Bilirubin 0.3 - 1.2 mg/dL 0.4 0.4 0.5  Alkaline Phos 38 - 126 U/L 84 85 85  AST 15 - 41 U/L _2 ALT 0 - 44 U/L _3 Lab Results  Component Value Date   WBC 7.3 02/25/2020   HGB 13.8 02/25/2020   HCT 40.1 02/25/2020   MCV 89.5 02/25/2020   PLT 314 02/25/2020   NEUTROABS 4.7 02/25/2020    ASSESSMENT & PLAN:  Malignant neoplasm of upper-inner quadrant of right breast in female, estrogen receptor positive ([place]) 03/09/2019: Right mastectomy [name]): IDC, 2.6cm, grade 2, clear margins, 6 lymph nodes negative for carcinoma., ER 100%, PR 90%, HER-2 equivocal by IHC, FISH positive ratio 2.76, copy #6.2, Ki-67 15%  Treatment plan: 1. Adjuvant Herceptin every 3 weeks. Subcutaneously starting 04/16/2019 completed  02/25/2020 2. Adjuvant antiestrogen therapywith anastrozole 1 mg daily starting 04/16/2019  Anastrozole toxicities: Joint stiffness Breast cancer surveillance: 1.  Mammogram: 01/13/2020: No evidence of malignancy 2. bone density: 01/13/2020: T score -1.5: Osteopenia: Recommend calcium and vitamin D  Return to clinic in 1 year for follow-up       No orders of the defined types were placed in this encounter.  The patient has a good understanding of the overall plan. she agrees with it. she will call with any problems that may develop before the next visit here.  Total time spent: 30 mins including face to face time and time spent for planning, charting and coordination of care  Nicholas Lose, MD 03/03/2020  I, Cloyde Reams Dorshimer, am acting as scribe for Dr. Nicholas Lose.  I have reviewed the above documentation for accuracy and completeness, and I agree with the above.

## 2020-03-03 ENCOUNTER — Inpatient Hospital Stay: Payer: Medicare HMO | Attending: Hematology and Oncology | Admitting: Hematology and Oncology

## 2020-03-03 ENCOUNTER — Encounter: Payer: Self-pay | Admitting: *Deleted

## 2020-03-03 ENCOUNTER — Other Ambulatory Visit: Payer: Self-pay

## 2020-03-03 DIAGNOSIS — Z9011 Acquired absence of right breast and nipple: Secondary | ICD-10-CM | POA: Diagnosis not present

## 2020-03-03 DIAGNOSIS — Z79899 Other long term (current) drug therapy: Secondary | ICD-10-CM | POA: Insufficient documentation

## 2020-03-03 DIAGNOSIS — Z7982 Long term (current) use of aspirin: Secondary | ICD-10-CM | POA: Insufficient documentation

## 2020-03-03 DIAGNOSIS — Z9221 Personal history of antineoplastic chemotherapy: Secondary | ICD-10-CM | POA: Insufficient documentation

## 2020-03-03 DIAGNOSIS — C50211 Malignant neoplasm of upper-inner quadrant of right female breast: Secondary | ICD-10-CM | POA: Diagnosis not present

## 2020-03-03 DIAGNOSIS — Z170 Estrogen receptor positive status [ER+]: Secondary | ICD-10-CM | POA: Insufficient documentation

## 2020-03-03 DIAGNOSIS — Z79811 Long term (current) use of aromatase inhibitors: Secondary | ICD-10-CM | POA: Insufficient documentation

## 2020-03-03 MED ORDER — ANASTROZOLE 1 MG PO TABS: 1.0000 mg | ORAL_TABLET | Freq: Every day | ORAL | 3 refills | Status: DC

## 2020-03-03 NOTE — Assessment & Plan Note (Signed)
03/09/2019: Right mastectomy Amber [name]): IDC, 2.6cm, grade 2, clear margins, 6 lymph nodes negative for carcinoma., ER 100%, PR 90%, HER-2 equivocal by IHC, FISH positive ratio 2.76, copy #6.2, Ki-67 15%  Treatment plan: 1. Adjuvant Herceptin every 3 weeks. Subcutaneously starting 04/16/2019 completed 02/25/2020 2. Adjuvant antiestrogen therapywith anastrozole 1 mg daily starting 04/16/2019  DEXA 12/2017 shows T score of -0.8 in left total femur (normal). Herceptin toxicity: 1,4-5 loose stools per day: Much improved with the probiotics 2.fatigue: Appears to be getting slowly worse with each treatment.  Anastrozole toxicities: Joint stiffness Breast cancer surveillance: 1.  Mammogram: 01/13/2020: No evidence of malignancy 2. bone density: 01/13/2020: T score -1.5: Osteopenia: Recommend calcium and vitamin D  Return to clinic in 1 year for follow-up

## 2020-03-17 ENCOUNTER — Encounter: Payer: Medicare HMO | Admitting: Family Medicine

## 2020-03-17 ENCOUNTER — Other Ambulatory Visit: Payer: Self-pay | Admitting: Family Medicine

## 2020-03-19 ENCOUNTER — Other Ambulatory Visit: Payer: Self-pay

## 2020-03-19 ENCOUNTER — Encounter: Payer: Self-pay | Admitting: Family Medicine

## 2020-03-19 ENCOUNTER — Other Ambulatory Visit: Payer: Self-pay | Admitting: Family Medicine

## 2020-03-19 ENCOUNTER — Ambulatory Visit (INDEPENDENT_AMBULATORY_CARE_PROVIDER_SITE_OTHER): Payer: Medicare HMO | Admitting: Family Medicine

## 2020-03-19 VITALS — BP 120/78 | HR 87 | Temp 98.2°F | Ht 63.0 in | Wt 133.0 lb

## 2020-03-19 DIAGNOSIS — C50911 Malignant neoplasm of unspecified site of right female breast: Secondary | ICD-10-CM

## 2020-03-19 DIAGNOSIS — M899 Disorder of bone, unspecified: Secondary | ICD-10-CM

## 2020-03-19 DIAGNOSIS — I119 Hypertensive heart disease without heart failure: Secondary | ICD-10-CM

## 2020-03-19 DIAGNOSIS — E785 Hyperlipidemia, unspecified: Secondary | ICD-10-CM

## 2020-03-19 DIAGNOSIS — R739 Hyperglycemia, unspecified: Secondary | ICD-10-CM

## 2020-03-19 DIAGNOSIS — E7800 Pure hypercholesterolemia, unspecified: Secondary | ICD-10-CM | POA: Diagnosis not present

## 2020-03-19 DIAGNOSIS — Z85038 Personal history of other malignant neoplasm of large intestine: Secondary | ICD-10-CM

## 2020-03-19 DIAGNOSIS — Z7189 Other specified counseling: Secondary | ICD-10-CM

## 2020-03-19 DIAGNOSIS — Z0000 Encounter for general adult medical examination without abnormal findings: Secondary | ICD-10-CM | POA: Diagnosis not present

## 2020-03-19 LAB — LIPID PANEL
Cholesterol: 236 mg/dL — ABNORMAL HIGH (ref 0–200)
HDL: 57.6 mg/dL (ref >39.00)
LDL Cholesterol: 146 mg/dL — ABNORMAL HIGH (ref 0–99)
NonHDL: 178.8
Total CHOL/HDL Ratio: 4
Triglycerides: 164 mg/dL — ABNORMAL HIGH (ref 0.0–149.0)
VLDL: 32.8 mg/dL (ref 0.0–40.0)

## 2020-03-19 LAB — HEMOGLOBIN A1C: Hgb A1c MFr Bld: 5.4 % (ref 4.6–6.5)

## 2020-03-19 LAB — VITAMIN D 25 HYDROXY (VIT D DEFICIENCY, FRACTURES): VITD: 51.33 ng/mL (ref 30.00–100.00)

## 2020-03-19 MED ORDER — AMLODIPINE BESYLATE 10 MG PO TABS: ORAL_TABLET | ORAL | 3 refills | Status: DC

## 2020-03-19 MED ORDER — OMEGA-3-ACID ETHYL ESTERS 1 G PO CAPS: 1.0000 g | ORAL_CAPSULE | Freq: Every day | ORAL | 3 refills | Status: AC

## 2020-03-19 MED ORDER — ROSUVASTATIN CALCIUM 5 MG PO TABS: ORAL_TABLET | ORAL | 1 refills | Status: DC

## 2020-03-19 MED ORDER — CALCIUM CARBONATE 600 MG PO TABS: 600.0000 mg | ORAL_TABLET | Freq: Every day | ORAL | Status: AC

## 2020-03-19 NOTE — Patient Instructions (Addendum)
Check with your insurance to see if they will cover the shingles shot. Go to the lab on the way out.   If you have mychart we'll likely use that to update you.    Update me as needed. If you have low BP/lightheadedness, then cut back on amlodipine and let me know.   Take care.  Glad to see you.

## 2020-03-19 NOTE — Progress Notes (Signed)
This visit occurred during the SARS-CoV-2 public health emergency.  Safety protocols were in place, including screening questions prior to the visit, additional usage of staff PPE, and extensive cleaning of exam room while observing appropriate contact time as indicated for disinfecting solutions.  I have personally reviewed the Medicare Annual Wellness questionnaire and have noted 1. The patient's medical and social history 2. Their use of alcohol, tobacco or illicit drugs 3. Their current medications and supplements 4. The patient's functional ability including ADL's, fall risks, home safety risks and hearing or visual             impairment. 5. Diet and physical activities 6. Evidence for depression or mood disorders  The patients weight, height, BMI have been recorded in the chart and visual acuity is per eye clinic.  I have made referrals, counseling and provided education to the patient based review of the above and I have provided the pt with a written personalized care plan for preventive services.  Provider list updated- see scanned forms.  Routine anticipatory guidance given to patient.  See health maintenance. The possibility exists that previously documented standard health maintenance information may have been brought forward from a previous encounter into this note.  If needed, that same information has been updated to reflect the current situation based on today's encounter.    Flu 2021 Shingles discussed with patient PNA up-to-date Tetanus 2012 COVID-vaccine 2021 Colon cancer screening not due given age. Breast cancer screening -mammogram 2021 Bone density test 2021 Advance directive- son designated patient were incapacitated. Cognitive function addressed- see scanned forms- and if abnormal then additional documentation follows.   Breast cancer per onc clinic.  Discussed.  She has gotten really good care and she is grateful for the effort of everyone over the oncology  clinic.  As a separate issue, her son had throat cancer when she was going through chemo.  She has had a lot going on.  No new breast mass or lump.  No new concerns.  H/o colon cancer.  No blood in stool.  No abd pain.  She graduated from routine screening given her age.  Discussed.  She agrees.  Hypertension:    Using medication without problems or lightheadedness: yes Chest pain with exertion:no Edema:no Short of breath:no  Elevated Cholesterol: Using medications without problems: yes, on crestor 2x/week.   Muscle aches: likely not from statin.   Diet compliance: yes Exercise:  GI sx better with probiotic. D/w pt. reasonable to continue.  PMH and SH reviewed  Meds, vitals, and allergies reviewed.   ROS: Per HPI.  Unless specifically indicated otherwise in HPI, the patient denies:  General: fever. Eyes: acute vision changes ENT: sore throat Cardiovascular: chest pain Respiratory: SOB GI: vomiting GU: dysuria Musculoskeletal: acute back pain Derm: acute rash Neuro: acute motor dysfunction Psych: worsening mood Endocrine: polydipsia Heme: bleeding Allergy: hayfever  GEN: nad, alert and oriented HEENT: mucous membranes moist NECK: supple w/o LA CV: rrr. PULM: ctab, no inc wob ABD: soft, +bs EXT: no edema SKIN: no acute rash

## 2020-03-22 DIAGNOSIS — E785 Hyperlipidemia, unspecified: Secondary | ICD-10-CM | POA: Insufficient documentation

## 2020-03-22 NOTE — Assessment & Plan Note (Signed)
Son designated patient were incapacitated.

## 2020-03-22 NOTE — Assessment & Plan Note (Signed)
Per oncology.  I appreciate the help of all involved.  No new concerns from patient.

## 2020-03-22 NOTE — Assessment & Plan Note (Signed)
Continue Crestor.  Continue work on diet and exercise.  See notes on labs.

## 2020-03-22 NOTE — Assessment & Plan Note (Signed)
Continue amlodipine.  Continue work on diet and exercise.  See notes on labs. 

## 2020-03-22 NOTE — Assessment & Plan Note (Signed)
H/o colon cancer.  No blood in stool.  No abd pain.  She graduated from routine screening given her age.  Discussed.  She agrees.

## 2020-03-22 NOTE — Assessment & Plan Note (Signed)
Flu 2021 Shingles discussed with patient PNA up-to-date Tetanus 2012 COVID-vaccine 2021 Colon cancer screening not due given age. Breast cancer screening -mammogram 2021 Bone density test 2021 Advance directive- son designated patient were incapacitated. Cognitive function addressed- see scanned forms- and if abnormal then additional documentation follows.

## 2020-04-06 ENCOUNTER — Encounter: Payer: Self-pay | Admitting: Hematology and Oncology

## 2020-05-18 DIAGNOSIS — C50911 Malignant neoplasm of unspecified site of right female breast: Secondary | ICD-10-CM | POA: Diagnosis not present

## 2020-08-13 DIAGNOSIS — H35033 Hypertensive retinopathy, bilateral: Secondary | ICD-10-CM | POA: Diagnosis not present

## 2020-08-13 DIAGNOSIS — H04123 Dry eye syndrome of bilateral lacrimal glands: Secondary | ICD-10-CM | POA: Diagnosis not present

## 2020-08-13 DIAGNOSIS — H524 Presbyopia: Secondary | ICD-10-CM | POA: Diagnosis not present

## 2020-08-13 DIAGNOSIS — H353131 Nonexudative age-related macular degeneration, bilateral, early dry stage: Secondary | ICD-10-CM | POA: Diagnosis not present

## 2020-08-25 DIAGNOSIS — Z0100 Encounter for examination of eyes and vision without abnormal findings: Secondary | ICD-10-CM | POA: Diagnosis not present

## 2020-10-15 DIAGNOSIS — L821 Other seborrheic keratosis: Secondary | ICD-10-CM | POA: Diagnosis not present

## 2020-10-15 DIAGNOSIS — D485 Neoplasm of uncertain behavior of skin: Secondary | ICD-10-CM | POA: Diagnosis not present

## 2020-10-15 DIAGNOSIS — L570 Actinic keratosis: Secondary | ICD-10-CM | POA: Diagnosis not present

## 2020-10-15 DIAGNOSIS — L430 Hypertrophic lichen planus: Secondary | ICD-10-CM | POA: Diagnosis not present

## 2021-01-14 DIAGNOSIS — Z1231 Encounter for screening mammogram for malignant neoplasm of breast: Secondary | ICD-10-CM | POA: Diagnosis not present

## 2021-01-14 LAB — HM MAMMOGRAPHY

## 2021-01-20 ENCOUNTER — Encounter: Payer: Self-pay | Admitting: Family Medicine

## 2021-01-20 ENCOUNTER — Telehealth: Payer: Self-pay

## 2021-01-20 NOTE — Telephone Encounter (Signed)
Per Dr. Damita Dunnings mammogram was negative for evidence of malignancy. Repeat as recommended. Called patient gave information and abstracted in chart. No further action needed.

## 2021-03-03 ENCOUNTER — Ambulatory Visit: Payer: Medicare HMO | Admitting: Hematology and Oncology

## 2021-03-17 NOTE — Progress Notes (Signed)
Patient Care Team: Tonia Ghent, MD as PCP - General (Family Medicine) Monna Fam, MD as Consulting Physician (Ophthalmology) Laurence Spates, MD (Inactive) as Consulting Physician (Gastroenterology) Henreitta Leber, DDS as Referring Physician (Dentistry) Mauro Kaufmann, RN as Oncology Nurse Navigator Rockwell Germany, RN as Oncology Nurse Navigator  DIAGNOSIS:    ICD-10-CM   1. Malignant neoplasm of upper-inner quadrant of right breast in female, estrogen receptor positive ([place])  C50.211    Z17.0       SUMMARY OF ONCOLOGIC HISTORY: Oncology History  Malignant neoplasm of upper-inner quadrant of right breast in female, estrogen receptor positive ([place])  02/13/2019 Initial Diagnosis   Routine screening mammogram detected 1.5cm right breast mass, 2.3cm on diagnostic mammogram, at the 1 o'clock position. Biopsy showed IDC, grade 2, HER-2 + by FISH, ER+ 100%, PR+ 90%, Ki67 15%.    03/19/2019 Surgery   Right mastectomy [name]): IDC, 2.6cm, grade 2, clear margins, 6 lymph nodes negative for carcinoma.    03/28/2019 -  Anti-estrogen oral therapy   Anastrozole    04/16/2019 -  Chemotherapy   The patient had trastuzumab-hyaluronidase-oysk (HERCEPTIN HYLECTA) 600-10000 MG-UNT/5ML chemo SQ injection 600 mg, 600 mg, Subcutaneous,  Once, 16 of 16 cycles Administration: 600 mg (04/16/2019), 600 mg (05/07/2019), 600 mg (05/28/2019), 600 mg (06/18/2019), 600 mg (07/09/2019), 600 mg (07/31/2019), 600 mg (08/20/2019), 600 mg (09/09/2019), 600 mg (10/01/2019), 600 mg (10/22/2019), 600 mg (11/12/2019), 600 mg (12/03/2019), 600 mg (12/24/2019), 600 mg (01/14/2020), 600 mg (02/04/2020), 600 mg (02/25/2020)   for chemotherapy treatment.      Genetic Testing   Negative genetic testing. No pathogenic variants identified on the [facility] STAT Breast cancer panel + Common Hereditary Cancers Panel. VUS in BRCA2 called c.5378A>G and a VUS in MUTYH called c.925C>T identified. The report date is 02/28/2019.   The  STAT Breast cancer panel offered by [facility] includes sequencing and rearrangement analysis for the following 9 genes:  ATM, BRCA1, BRCA2, CDH1, CHEK2, PALB2, PTEN, STK11 and TP53.    The Common Hereditary Cancers Panel offered by [facility] includes sequencing and/or deletion duplication testing of the following 48 genes: APC, ATM, AXIN2, BARD1, BMPR1A, BRCA1, BRCA2, BRIP1, CDH1, CDKN2A (p14ARF), CDKN2A (p16INK4a), CKD4, CHEK2, CTNNA1, DICER1, EPCAM (Deletion/duplication testing only), GREM1 (promoter region deletion/duplication testing only), KIT, MEN1, MLH1, MSH2, MSH3, MSH6, MUTYH, NBN, NF1, NHTL1, PALB2, PDGFRA, PMS2, POLD1, POLE, PTEN, RAD50, RAD51C, RAD51D, RNF43, SDHB, SDHC, SDHD, SMAD4, SMARCA4. STK11, TP53, TSC1, TSC2, and VHL.  The following genes were evaluated for sequence changes only: SDHA and HOXB13 c.251G>A variant only.     CHIEF COMPLIANT: Follow-up of right breast cancer  INTERVAL HISTORY: Amber [name] is a 88 y.o. with above-mentioned history of right breast cancer who underwent a mastectomy, and is currently on adjuvant Herceptin and antiestrogen therapy with anastrozole. She presents to the clinic today for treatment.  Complaining of diarrhea every day after she takes anastrozole.  If she does not take anastrozole there is no diarrhea.  Denies any pain or discomfort in the breast.  Denies any nausea vomiting.  ALLERGIES:  is allergic to ace inhibitors, escitalopram oxalate, lipitor [atorvastatin calcium], zetia [ezetimibe], and zocor [simvastatin - high dose].  MEDICATIONS:  Current Outpatient Medications  Medication Sig Dispense Refill   amLODipine (NORVASC) 10 MG tablet TAKE 1 TABLET BY MOUTH EVERY DAY. 90 tablet 3   anastrozole (ARIMIDEX) 1 MG tablet Take 1 tablet (1 mg total) by mouth daily. 90 tablet 3   aspirin 325 MG tablet Take  325 mg by mouth daily.     calcium carbonate (CALCIUM 600) 600 MG TABS tablet Take 1 tablet (600 mg total) by mouth daily with breakfast.      Cholecalciferol 1000 units tablet Take 1 tablet (1,000 Units total) by mouth daily.     Multiple Vitamin (MULTIVITAMIN) tablet Take 1 tablet by mouth daily.     omega-3 acid ethyl esters (LOVAZA) 1 g capsule Take 1 capsule (1 g total) by mouth daily. 90 capsule 3   rosuvastatin (CRESTOR) 5 MG tablet TAKE 1 TABLET BY MOUTH TWICE WEEKLY 24 tablet 1   saccharomyces boulardii (FLORASTOR) 250 MG capsule Take 1 capsule (250 mg total) by mouth 2 (two) times daily. 60 capsule 3   No current facility-administered medications for this visit.    PHYSICAL EXAMINATION: ECOG PERFORMANCE STATUS: 1 - Symptomatic but completely ambulatory  Vitals:   03/18/21 1504  BP: (!) 144/64  Pulse: 93  Resp: 18  Temp: 97.8 F (36.6 C)  SpO2: 96%   Filed Weights   03/18/21 1504  Weight: 138 lb 14.4 oz (63 kg)    BREAST: No palpable masses or nodules in either right or left breasts. No palpable axillary supraclavicular or infraclavicular adenopathy no breast tenderness or nipple discharge. (exam performed in the presence of a chaperone)  LABORATORY DATA:  I have reviewed the data as listed CMP Latest Ref Rng & Units 02/25/2020 02/04/2020 12/24/2019  Glucose 70 - 99 mg/dL 124(H) 134(H) 89  BUN 8 - 23 mg/dL '12 14 16  ' Creatinine 0.44 - 1.00 mg/dL 1.15(H) 1.01(H) 1.18(H)  Sodium 135 - 145 mmol/L 141 141 138  Potassium 3.5 - 5.1 mmol/L 3.6 3.9 3.7  Chloride 98 - 111 mmol/L 105 104 102  CO2 22 - 32 mmol/L '30 30 30  ' Calcium 8.9 - 10.3 mg/dL 9.5 9.6 9.9  Total Protein 6.5 - 8.1 g/dL 7.4 7.1 7.3  Total Bilirubin 0.3 - 1.2 mg/dL 0.4 0.4 0.5  Alkaline Phos 38 - 126 U/L 84 85 85  AST 15 - 41 U/L '16 15 16  ' ALT 0 - 44 U/L '12 13 14    ' Lab Results  Component Value Date   WBC 7.3 02/25/2020   HGB 13.8 02/25/2020   HCT 40.1 02/25/2020   MCV 89.5 02/25/2020   PLT 314 02/25/2020   NEUTROABS 4.7 02/25/2020    ASSESSMENT & PLAN:  Malignant neoplasm of upper-inner quadrant of right breast in female, estrogen  receptor positive ([place]) 03/09/2019: Right mastectomy [name]): IDC, 2.6cm, grade 2, clear margins, 6 lymph nodes negative for carcinoma.,  ER 100%, PR 90%, HER-2 equivocal by IHC, FISH positive ratio 2.76, copy #6.2, Ki-67 15%   Treatment plan: 1. Adjuvant Herceptin every 3 weeks. Subcutaneously starting 04/16/2019 completed 02/25/2020 2. Adjuvant antiestrogen therapy with anastrozole 1 mg daily starting 04/16/2019, switched to letrozole 03/18/2021 due to diarrhea   Anastrozole toxicities: Diarrhea Therefore we decided to switch her from anastrozole to letrozole.  Breast cancer surveillance: 1.  Mammogram: 01/14/2021: No evidence of malignancy at [facility], breast density category B 2. bone density: 01/13/2020: T score -1.5: Osteopenia: Recommend calcium and vitamin D 3.  Breast exam 03/18/2021: Benign   Return to clinic in 1 year for follow-up      No orders of the defined types were placed in this encounter.  The patient has a good understanding of the overall plan. she agrees with it. she will call with any problems that may develop before the next visit here.  Total  time spent: 30 mins including face to face time and time spent for planning, charting and coordination of care  Rulon Eisenmenger, MD, MPH 03/18/2021  I, Thana Ates, am acting as scribe for Dr. Nicholas Lose.  I have reviewed the above documentation for accuracy and completeness, and I agree with the above.

## 2021-03-18 ENCOUNTER — Inpatient Hospital Stay: Payer: Medicare HMO | Attending: Hematology and Oncology | Admitting: Hematology and Oncology

## 2021-03-18 ENCOUNTER — Other Ambulatory Visit: Payer: Self-pay

## 2021-03-18 DIAGNOSIS — Z170 Estrogen receptor positive status [ER+]: Secondary | ICD-10-CM | POA: Diagnosis not present

## 2021-03-18 DIAGNOSIS — C50211 Malignant neoplasm of upper-inner quadrant of right female breast: Secondary | ICD-10-CM | POA: Diagnosis not present

## 2021-03-18 DIAGNOSIS — R197 Diarrhea, unspecified: Secondary | ICD-10-CM | POA: Diagnosis not present

## 2021-03-18 MED ORDER — SACCHAROMYCES BOULARDII 250 MG PO CAPS: 250.0000 mg | ORAL_CAPSULE | Freq: Every day | ORAL | 3 refills | Status: AC

## 2021-03-18 MED ORDER — LETROZOLE 2.5 MG PO TABS: 2.5000 mg | ORAL_TABLET | Freq: Every day | ORAL | 3 refills | Status: DC

## 2021-03-18 NOTE — Assessment & Plan Note (Signed)
03/09/2019: Right mastectomy [name]): IDC, 2.6cm, grade 2, clear margins, 6 lymph nodes negative for carcinoma., ER 100%, PR 90%, HER-2 equivocal by IHC, FISH positive ratio 2.76, copy #6.2, Ki-67 15%  Treatment plan: 1. Adjuvant Herceptin every 3 weeks. Subcutaneously starting 04/16/2019 completed 02/25/2020 2. Adjuvant antiestrogen therapywith anastrozole 1 mg daily starting 04/16/2019  Anastrozole toxicities:Joint stiffness Breast cancer surveillance: 1.  Mammogram: 01/14/2021: No evidence of malignancy at [facility], breast density category B 2. bone density: 01/13/2020: T score -1.5: Osteopenia: Recommend calcium and vitamin D 3.  Breast exam 03/18/2021: Benign  Return to clinic in 1 year for follow-up

## 2021-03-23 NOTE — Progress Notes (Signed)
..  Patient Assist/Replace for the following has been terminated. Medication: Herceptin Hylecta Reason for Termination: Treatment completed on 02/25/2020, no active treatment since 01/2020. Last DOS: 02/25/2020.  Marland KitchenJuan Quam, CPhT IV Drug Replacement Specialist [place] Phone: [phone]

## 2021-04-10 ENCOUNTER — Telehealth: Payer: Self-pay | Admitting: Family Medicine

## 2021-04-12 NOTE — Telephone Encounter (Signed)
Patient due for AWV; please call to schedule patient

## 2021-04-12 NOTE — Telephone Encounter (Signed)
LOV - 03/19/20 NOV - not scheduled yet RF - 03/19/20 #90/3

## 2021-04-13 NOTE — Telephone Encounter (Signed)
Sent.  Please schedule.  Thanks

## 2021-04-13 NOTE — Telephone Encounter (Signed)
Called mrs. Devoto and got her scheduled for 4/6 @830 

## 2021-06-03 ENCOUNTER — Encounter: Payer: Self-pay | Admitting: Family Medicine

## 2021-06-03 ENCOUNTER — Ambulatory Visit (INDEPENDENT_AMBULATORY_CARE_PROVIDER_SITE_OTHER): Payer: Medicare HMO | Admitting: Family Medicine

## 2021-06-03 VITALS — BP 124/64 | HR 76 | Temp 97.6°F | Ht 63.0 in | Wt 138.0 lb

## 2021-06-03 DIAGNOSIS — E785 Hyperlipidemia, unspecified: Secondary | ICD-10-CM | POA: Diagnosis not present

## 2021-06-03 DIAGNOSIS — E7800 Pure hypercholesterolemia, unspecified: Secondary | ICD-10-CM | POA: Diagnosis not present

## 2021-06-03 DIAGNOSIS — Z7189 Other specified counseling: Secondary | ICD-10-CM

## 2021-06-03 DIAGNOSIS — Z0000 Encounter for general adult medical examination without abnormal findings: Secondary | ICD-10-CM

## 2021-06-03 DIAGNOSIS — R42 Dizziness and giddiness: Secondary | ICD-10-CM | POA: Diagnosis not present

## 2021-06-03 DIAGNOSIS — I119 Hypertensive heart disease without heart failure: Secondary | ICD-10-CM | POA: Diagnosis not present

## 2021-06-03 DIAGNOSIS — M899 Disorder of bone, unspecified: Secondary | ICD-10-CM | POA: Diagnosis not present

## 2021-06-03 LAB — CBC WITH DIFFERENTIAL/PLATELET
Basophils Absolute: 0 10*3/uL (ref 0.0–0.1)
Basophils Relative: 0.5 % (ref 0.0–3.0)
Eosinophils Absolute: 0.2 10*3/uL (ref 0.0–0.7)
Eosinophils Relative: 2.7 % (ref 0.0–5.0)
HCT: 41.4 % (ref 36.0–46.0)
Hemoglobin: 14 g/dL (ref 12.0–15.0)
Lymphocytes Relative: 30.7 % (ref 12.0–46.0)
Lymphs Abs: 2.3 10*3/uL (ref 0.7–4.0)
MCHC: 33.7 g/dL (ref 30.0–36.0)
MCV: 90.1 fl (ref 78.0–100.0)
Monocytes Absolute: 0.6 10*3/uL (ref 0.1–1.0)
Monocytes Relative: 7.6 % (ref 3.0–12.0)
Neutro Abs: 4.3 10*3/uL (ref 1.4–7.7)
Neutrophils Relative %: 58.5 % (ref 43.0–77.0)
Platelets: 322 10*3/uL (ref 150.0–400.0)
RBC: 4.59 Mil/uL (ref 3.87–5.11)
RDW: 13.4 % (ref 11.5–15.5)
WBC: 7.4 10*3/uL (ref 4.0–10.5)

## 2021-06-03 LAB — COMPREHENSIVE METABOLIC PANEL
ALT: 13 U/L (ref 0–35)
AST: 24 U/L (ref 0–37)
Albumin: 4.4 g/dL (ref 3.5–5.2)
Alkaline Phosphatase: 98 U/L (ref 39–117)
BUN: 17 mg/dL (ref 6–23)
CO2: 27 mEq/L (ref 19–32)
Calcium: 9.8 mg/dL (ref 8.4–10.5)
Chloride: 103 mEq/L (ref 96–112)
Creatinine, Ser: 0.85 mg/dL (ref 0.40–1.20)
GFR: 62.45 mL/min (ref >60.00)
Glucose, Bld: 114 mg/dL — ABNORMAL HIGH (ref 70–99)
Potassium: 4.2 mEq/L (ref 3.5–5.1)
Sodium: 141 mEq/L (ref 135–145)
Total Bilirubin: 0.6 mg/dL (ref 0.2–1.2)
Total Protein: 7.1 g/dL (ref 6.0–8.3)

## 2021-06-03 LAB — LIPID PANEL
Cholesterol: 234 mg/dL — ABNORMAL HIGH (ref 0–200)
HDL: 60.4 mg/dL (ref >39.00)
LDL Cholesterol: 152 mg/dL — ABNORMAL HIGH (ref 0–99)
NonHDL: 173.57
Total CHOL/HDL Ratio: 4
Triglycerides: 106 mg/dL (ref 0.0–149.0)
VLDL: 21.2 mg/dL (ref 0.0–40.0)

## 2021-06-03 LAB — TSH: TSH: 2.44 u[IU]/mL (ref 0.35–5.50)

## 2021-06-03 LAB — VITAMIN D 25 HYDROXY (VIT D DEFICIENCY, FRACTURES): VITD: 59.13 ng/mL (ref 30.00–100.00)

## 2021-06-03 MED ORDER — ROSUVASTATIN CALCIUM 5 MG PO TABS: ORAL_TABLET | ORAL | 3 refills | Status: DC

## 2021-06-03 MED ORDER — AMLODIPINE BESYLATE 5 MG PO TABS: 5.0000 mg | ORAL_TABLET | Freq: Every day | ORAL | Status: DC

## 2021-06-03 NOTE — Patient Instructions (Addendum)
Go to the lab on the way out.   If you have mychart we'll likely use that to update you.    ?Skip amlodipine today.  Cut the amlodipine back to '5mg'$  a day.  Update me about your BP and how you feel next week.   ?

## 2021-06-03 NOTE — Progress Notes (Signed)
I have personally reviewed the Medicare Annual Wellness questionnaire and have noted ?1. The patient's medical and social history ?2. Their use of alcohol, tobacco or illicit drugs ?3. Their current medications and supplements ?4. The patient's functional ability including ADL's, fall risks, home safety risks and hearing or visual ?            impairment. ?5. Diet and physical activities ?6. Evidence for depression or mood disorders ? ?The patients weight, height, BMI have been recorded in the chart and visual acuity is per eye clinic.  ?I have made referrals, counseling and provided education to the patient based review of the above and I have provided the pt with a written personalized care plan for preventive services. ? ?Provider list updated- see scanned forms.  Routine anticipatory guidance given to patient.  See health maintenance. The possibility exists that previously documented standard health maintenance information may have been brought forward from a previous encounter into this note.  If needed, that same information has been updated to reflect the current situation based on today's encounter.   ? ?Flu 2022 ?Shingles 2022 ?PNA up-to-date ?Tetanus 2012, discussed with patient ?COVID-vaccine previously done ?Colon cancer screening not due given her age. ?Breast cancer screening 2022 ?Bone density test 2021 ?Advance directive- son designated if patient were incapacitated.   ?Cognitive function addressed- see scanned forms- and if abnormal then additional documentation follows.  ? ?In addition to [facility] Wellness, follow up visit for the below conditions: ? ?Hypertension:    ?Using medication without problems or lightheadedness: she can get lightheaded, episodic, with activity, noted over the last 6 months.   ?Chest pain with exertion:no ?Edema:no ?Short of breath:no ?Labs pending.  See notes on labs. ? ?No known black or bloody stools.  No abd pain.  Routine cautions given to patient. ? ?Elevated  Cholesterol: ?Using medications without problems: yes ?Muscle aches: no ?Diet compliance: d/w pt.   ?Exercise: yes ? ?She is tolerating letrozole.  Per outside clinic.   I will defer.  She agrees. ? ?PMH and SH reviewed ? ?Meds, vitals, and allergies reviewed.  ? ?ROS: Per HPI.  Unless specifically indicated otherwise in HPI, the patient denies: ? ?General: fever. ?Eyes: acute vision changes ?ENT: sore throat ?Cardiovascular: chest pain ?Respiratory: SOB ?GI: vomiting ?GU: dysuria ?Musculoskeletal: acute back pain ?Derm: acute rash ?Neuro: acute motor dysfunction ?Psych: worsening mood ?Endocrine: polydipsia ?Heme: bleeding ?Allergy: hayfever ? ?GEN: nad, alert and oriented ?HEENT: ncat ?NECK: supple w/o LA ?CV: rrr. ?PULM: ctab, no inc wob ?ABD: soft, +bs ?EXT: no edema ?SKIN: Well-perfused. ?

## 2021-06-06 NOTE — Assessment & Plan Note (Signed)
Continue Lovaza and Crestor.  See notes on labs. ?

## 2021-06-06 NOTE — Assessment & Plan Note (Signed)
Flu 2022 ?Shingles 2022 ?PNA up-to-date ?Tetanus 2012, discussed with patient ?COVID-vaccine previously done ?Colon cancer screening not due given her age. ?Breast cancer screening 2022 ?Bone density test 2021 ?Advance directive- son designated if patient were incapacitated.  ?Cognitive function addressed- see scanned forms- and if abnormal then additional documentation follows.  ?

## 2021-06-06 NOTE — Assessment & Plan Note (Addendum)
Occasionally lightheaded.  Discussed that this could be from overtreatment of her blood pressure.  Advised to skip amlodipine today.  Cut the amlodipine back to '5mg'$  a day starting tomorrow.  I asked her to update me about her BP and how she feels next week.   ?

## 2021-06-10 ENCOUNTER — Telehealth: Payer: Self-pay

## 2021-06-16 NOTE — Telephone Encounter (Signed)
error 

## 2021-08-12 DIAGNOSIS — H43393 Other vitreous opacities, bilateral: Secondary | ICD-10-CM | POA: Diagnosis not present

## 2021-08-12 DIAGNOSIS — H353131 Nonexudative age-related macular degeneration, bilateral, early dry stage: Secondary | ICD-10-CM | POA: Diagnosis not present

## 2021-08-12 DIAGNOSIS — H524 Presbyopia: Secondary | ICD-10-CM | POA: Diagnosis not present

## 2021-08-12 DIAGNOSIS — H40013 Open angle with borderline findings, low risk, bilateral: Secondary | ICD-10-CM | POA: Diagnosis not present

## 2021-08-12 DIAGNOSIS — H35033 Hypertensive retinopathy, bilateral: Secondary | ICD-10-CM | POA: Diagnosis not present

## 2021-08-27 ENCOUNTER — Other Ambulatory Visit: Payer: Self-pay | Admitting: Nurse Practitioner

## 2021-10-25 DIAGNOSIS — C50911 Malignant neoplasm of unspecified site of right female breast: Secondary | ICD-10-CM | POA: Diagnosis not present

## 2021-11-01 DIAGNOSIS — M8580 Other specified disorders of bone density and structure, unspecified site: Secondary | ICD-10-CM | POA: Diagnosis not present

## 2021-11-01 DIAGNOSIS — Z888 Allergy status to other drugs, medicaments and biological substances status: Secondary | ICD-10-CM | POA: Diagnosis not present

## 2021-11-01 DIAGNOSIS — Z008 Encounter for other general examination: Secondary | ICD-10-CM | POA: Diagnosis not present

## 2021-11-01 DIAGNOSIS — Z803 Family history of malignant neoplasm of breast: Secondary | ICD-10-CM | POA: Diagnosis not present

## 2021-11-01 DIAGNOSIS — E785 Hyperlipidemia, unspecified: Secondary | ICD-10-CM | POA: Diagnosis not present

## 2021-11-01 DIAGNOSIS — I10 Essential (primary) hypertension: Secondary | ICD-10-CM | POA: Diagnosis not present

## 2021-11-01 DIAGNOSIS — Z79811 Long term (current) use of aromatase inhibitors: Secondary | ICD-10-CM | POA: Diagnosis not present

## 2021-11-01 DIAGNOSIS — Z85038 Personal history of other malignant neoplasm of large intestine: Secondary | ICD-10-CM | POA: Diagnosis not present

## 2021-11-01 DIAGNOSIS — C50919 Malignant neoplasm of unspecified site of unspecified female breast: Secondary | ICD-10-CM | POA: Diagnosis not present

## 2021-11-02 ENCOUNTER — Telehealth: Payer: Self-pay

## 2021-11-02 NOTE — Patient Instructions (Signed)
Visit Information  Thank you for taking time to visit with me today. Please don't hesitate to contact me if I can be of assistance to you.   Following are the goals we discussed today:   Goals Addressed               This Visit's Progress     Regulate BP (pt-stated)        Care Coordination Interventions: Evaluation of current treatment plan related to hypertension self management and patient's adherence to plan as established by provider Reviewed medications with patient and discussed importance of compliance Discussed plans with patient for ongoing care management follow up and provided patient with direct contact information for care management team Advised patient, providing education and rationale, to monitor blood pressure daily and record, calling PCP for findings outside established parameters Advised patient to discuss med regimen with provider Assessed social determinant of health barriers          Our next appointment is by telephone on 12/02/21 at 9am  Please call the care guide team at [phone] if you need to cancel or reschedule your appointment.   If you are experiencing a Mental Health or [place] or need someone to talk to, please call the Suicide and Crisis Lifeline: 988 call 911   The patient has been provided with contact information for the care management team and has been advised to call with any health related questions or concerns.

## 2021-11-02 NOTE — Patient Outreach (Addendum)
  Care Coordination   Initial Visit Note   11/02/2021 Name: Amber [name] MRN: [id number] DOB: [birth year]  Amber [name] is a 88 y.o. year old female who sees Tonia Ghent, MD for primary care. I spoke with  Amber [name] by phone today.  What matters to the patients health and wellness today?  Patient states her BP meds were changed a few months ago due to BP running low and having some dizziness. She has noticed that her BP is now starting to run back high(140-160s). Patient not checking BP daily in the home-encouraged to do so and keep log.She has not contacted PCP-encouraged to do so and will call office.   AWV: completed 06/03/21  Goals Addressed               This Visit's Progress     Regulate BP (pt-stated)        Care Coordination Interventions: Evaluation of current treatment plan related to hypertension self management and patient's adherence to plan as established by provider Reviewed medications with patient and discussed importance of compliance Discussed plans with patient for ongoing care management follow up and provided patient with direct contact information for care management team Advised patient, providing education and rationale, to monitor blood pressure daily and record, calling PCP for findings outside established parameters Advised patient to discuss med regimen with provider Assessed social determinant of health barriers          SDOH assessments and interventions completed:  Yes  SDOH Interventions Today    Flowsheet Row Most Recent Value  SDOH Interventions   Food Insecurity Interventions Intervention Not Indicated  Transportation Interventions Intervention Not Indicated        Care Coordination Interventions Activated:  Yes  Care Coordination Interventions:  Yes, provided patient with BP education and mgmt   Follow up plan: Follow up call scheduled for 12/02/21-9am. Patient prefers AM calls     Encounter Outcome:  Pt. Visit  Completed   Enzo Montgomery, RN,BSN,CCM [facility] [facility] Telephonic Care Management Coordinator Direct Phone: [phone] Toll Free: [phone] Fax: [phone]

## 2021-12-07 ENCOUNTER — Ambulatory Visit: Payer: Self-pay

## 2021-12-07 NOTE — Patient Outreach (Signed)
  Care Coordination   Follow Up Visit Note   12/07/2021 Name: Amber [name] MRN: [id number] DOB: 1933-12-16  Amber [name] is a 88 y.o. year old female who sees Tonia Ghent, MD for primary care. I spoke with  [name] by phone today.  What matters to the patients health and wellness today?  Patient states her blood pressures have been elevated and she is experiencing headaches.  She states she is not taking her blood pressure medication.   Reported blood pressure readings: 135/98, 169/110, 153/108, 168/106, 158/100, 165/100 with today's blood pressure being 146/99.  Patient states she was taking amlodipine 10 mg some months ago and was told by her provider to take 5 mg. She states she cut her 10 mg tabs in 1/2.  Patient states she was called and told to stop taking her blood pressure pill " by someone" but unable to say who called.   Patient informed that RNCM will send message to primary care provider informing him of blood pressure readings/ symptoms.   Goals Addressed               This Visit's Progress     Patient stated:  Regulate BP (pt-stated)        Care Coordination Interventions: Evaluation of current treatment plan related to hypertension self management and patient's adherence to plan as established by provider Reviewed medications with patient and discussed importance of compliance Discussed plans with patient for care management follow up and provided patient with direct contact information for care management team Sent patients primary care provider update on patients elevated blood pressure readings/ headaches.  Advised that patient per her report has not been taking her blood pressure medication due to receiving call advising her not to.  Advised patient, to continue to  monitor blood pressure daily and record, calling PCP for findings outside established parameters Advised patient  if she has questions or concerns regarding her medications to contact her  provider office to discuss. Patient advised to always write down name, contact phone number, and where person is calling from if calling regarding medical concerns/changes [facility] determine if patient had current [facility]lodipine prescription          SDOH assessments and interventions completed:  No     Care Coordination Interventions Activated:  Yes  Care Coordination Interventions:  Yes, provided   Follow up plan: Follow up call scheduled for 01/03/22 at 10 am    Encounter Outcome:  Pt. Visit Completed

## 2021-12-12 ENCOUNTER — Telehealth: Payer: Self-pay | Admitting: Family Medicine

## 2021-12-12 MED ORDER — AMLODIPINE BESYLATE 5 MG PO TABS: 5.0000 mg | ORAL_TABLET | Freq: Every day | ORAL | 1 refills | Status: DC

## 2021-12-12 NOTE — Telephone Encounter (Signed)
Please check with patient.  Message received that patient had elevated blood pressures but was not on amlodipine.  I sent a prescription for amlodipine 5 mg.  Reasonable to start that in the meantime, have her check her blood pressure at home, and then schedule follow-up when possible.  Thanks.

## 2021-12-13 NOTE — Telephone Encounter (Signed)
Spoke with patient about restarting rx and patient is okay with that. Advised that rx was sent in. Scheduled appt for 12/27/21 at 10:30 am.

## 2021-12-27 ENCOUNTER — Ambulatory Visit: Payer: Medicare HMO | Admitting: Family Medicine

## 2022-01-07 ENCOUNTER — Ambulatory Visit: Payer: Self-pay

## 2022-01-07 NOTE — Patient Outreach (Signed)
  Care Coordination   Follow Up Visit Note   01/07/2022 Name: Amber [name] MRN: [id number] DOB: 1933-09-09  Amber [name] is a 88 y.o. year old female who sees Tonia Ghent, MD for primary care. I spoke with  [name] by phone today.  What matters to the patients health and wellness today?  Patient states her son passed away within the past two weeks.  Patient tearful at onset of phone call.  Patient states she is doing ok.  She reports having a strong support system and states family is coming in town tomorrow to stay with her.   Patient states her blood pressures have ranged from 130/80's to 142/104.  She states she took her blood pressure several times today.  Patient reports she is taking her medications as prescribed.  States she is taking the amlodipine as prescribed as well.  Patient declined grief counseling for now.    Goals Addressed               This Visit's Progress     Patient stated:  Regulate BP (pt-stated)        Care Coordination Interventions: Evaluation of current treatment plan related to hypertension self management and patient's adherence to plan as established by provider Reviewed medications with patient and discussed importance of compliance Advised patient, to continue to  monitor blood pressure daily and record, calling PCP for findings outside established parameters Advised patient not to check her blood pressure several times in one day. Advised to check 1 time per day unless having symptoms.  Active listening and support due to patients son recently passing away.  Discussed / offered grief counseling if / when needed.           SDOH assessments and interventions completed:  No     Care Coordination Interventions Activated:  Yes  Care Coordination Interventions:  Yes, provided   Follow up plan: Follow up call scheduled for 01/26/22 at 1:30 pm    Encounter Outcome:  Pt. Visit Completed   Quinn Plowman RN,BSN,CCM [place] [phone] direct line

## 2022-01-26 ENCOUNTER — Ambulatory Visit: Payer: Self-pay

## 2022-01-26 NOTE — Patient Outreach (Signed)
  Care Coordination   Follow Up Visit Note   01/26/2022 Name: Amber [name] MRN: [id number] DOB: 1933/10/01  Amber [name] is a 88 y.o. year old female who sees Tonia Ghent, MD for primary care. I spoke with  Amber [name] by phone today.  What matters to the patients health and wellness today?  Patient states her Amber [name] was a little rough after the death of her son.  She states overall she is doing ok.  Patient reports recorded blood pressure readings:  121/87, 105/70, 144/79, 131/,83, 176/87, 165/93.   Patient states she does not take her blood pressure medication if her blood pressure has a lower reading.  She states she is afraid her blood pressure will go to low.   She reports she takes her blood pressure medication when her blood pressure goes up 140's/70 - 170's/90's.  Patient denies any blood pressure related symptoms.    Goals Addressed               This Visit's Progress     Patient stated:  Regulate BP (pt-stated)        Care Coordination Interventions: Evaluation of current treatment plan related to hypertension self management and patient's adherence to plan as established by provider Patient advised to take her blood pressure medication as prescribed.  Advised to schedule follow up appointment with her primary care provider to discuss elevated blood pressures Message sent to PCP informing him of patients elevated blood pressures and advisement by [facility] to schedule follow up visit with primary provider.  Reviewed medications with patient and discussed importance of compliance Advised patient, to continue to  monitor blood pressure daily and record, calling PCP for findings outside established parameters Active listening and support due to patients son recently passing away.            SDOH assessments and interventions completed:  No     Care Coordination Interventions:  Yes, provided   Follow up plan: Follow up call scheduled for 03/01/22     Encounter Outcome:  Pt. Visit Completed   Quinn Plowman RN,BSN,CCM [place] [phone] direct line

## 2022-01-27 ENCOUNTER — Telehealth: Payer: Self-pay

## 2022-01-27 NOTE — Telephone Encounter (Signed)
Patient scheduled.

## 2022-01-27 NOTE — Telephone Encounter (Signed)
Patient needs a BP f/u appt; please call patient to schedule.

## 2022-02-03 ENCOUNTER — Encounter: Payer: Self-pay | Admitting: Family Medicine

## 2022-02-03 ENCOUNTER — Ambulatory Visit (INDEPENDENT_AMBULATORY_CARE_PROVIDER_SITE_OTHER): Payer: Medicare HMO | Admitting: Family Medicine

## 2022-02-03 VITALS — BP 140/80 | HR 96 | Temp 97.9°F | Ht 63.0 in | Wt 127.0 lb

## 2022-02-03 DIAGNOSIS — R69 Illness, unspecified: Secondary | ICD-10-CM | POA: Diagnosis not present

## 2022-02-03 DIAGNOSIS — Z659 Problem related to unspecified psychosocial circumstances: Secondary | ICD-10-CM

## 2022-02-03 DIAGNOSIS — Z634 Disappearance and death of family member: Secondary | ICD-10-CM

## 2022-02-03 MED ORDER — MIRTAZAPINE 7.5 MG PO TABS: 7.5000 mg | ORAL_TABLET | Freq: Every day | ORAL | 0 refills | Status: DC

## 2022-02-03 NOTE — Progress Notes (Signed)
Her son and granddaughter both recently died, separately, about 18 days.  Her son died of an illness and her granddaughter died in an MVA.  Her great grandson has to handle both estates.  She had one child, her son had one child- both are gone. She is still living at home, alone.  "My nerves are on edge."  Taking benadryl to sleep, prev with trouble getting to sleep and staying asleep.  Appetite is down.  No SI/HI.  Tearful.  More bothered going into a crowd.  Has supportive friends.  Discussed potentially going to grief counseling.  She can consider.  Blood pressure has generally been controlled on home checks.  Meds, vitals, and allergies reviewed.   ROS: Per HPI unless specifically indicated in ROS section   GEN: nad, alert and oriented, tearful but regains composure. HEENT: ncat NECK: supple w/o LA CV: rrr. PULM: ctab, no inc wob ABD: soft, +bs EXT: no edema SKIN: no acute rash

## 2022-02-03 NOTE — Patient Instructions (Signed)
Try mirtazapine at night for sleep. It may help your mood and appetite.   Let me know how this goes with the medicine.  Take care.  Glad to see you.

## 2022-02-06 DIAGNOSIS — Z659 Problem related to unspecified psychosocial circumstances: Secondary | ICD-10-CM | POA: Insufficient documentation

## 2022-02-06 NOTE — Assessment & Plan Note (Signed)
Discussed with patient about grief.  Okay for outpatient follow-up.  Discussed symptom management. Reasonable to try mirtazapine at night for sleep. It may help mood and appetite also.  Discussed with patient.  She agrees.  Okay for outpatient follow-up.

## 2022-02-14 ENCOUNTER — Ambulatory Visit: Payer: Medicare HMO | Admitting: Family Medicine

## 2022-03-01 ENCOUNTER — Ambulatory Visit: Payer: Self-pay

## 2022-03-01 DIAGNOSIS — Z1231 Encounter for screening mammogram for malignant neoplasm of breast: Secondary | ICD-10-CM | POA: Diagnosis not present

## 2022-03-01 LAB — HM MAMMOGRAPHY

## 2022-03-01 NOTE — Patient Outreach (Signed)
  Care Coordination   03/01/2022 Name: Amber [name] MRN: [id number] DOB: December 28, 1933   Care Coordination Outreach Attempts:  An unsuccessful telephone outreach was attempted for a scheduled appointment today.  Follow Up Plan:  Additional outreach attempts will be made to offer the patient care coordination information and services.   Encounter Outcome:  No Answer   Care Coordination Interventions:  No, not indicated    Quinn Plowman [facility] [phone] direct line

## 2022-03-03 ENCOUNTER — Telehealth: Payer: Self-pay

## 2022-03-03 NOTE — Patient Outreach (Signed)
  Care Coordination   Follow Up Visit Note   03/03/2022 Name: Amber [name] MRN: [id number] DOB: 08-24-1933  Amber [name] is a 89 y.o. year old female who sees Tonia Ghent, MD for primary care. I spoke with  [name] by phone today.  What matters to the patients health and wellness today?  Ongoing management of blood pressure.     Goals Addressed               This Visit's Progress     Patient stated:  Regulate BP (pt-stated)        Care Coordination Interventions: Evaluation of current treatment plan related to hypertension self management and patient's adherence to plan as established by provider:  Patient reports recent loss of her granddaughter. She states her granddaughter got killed in a motorcycle accident 18 days after her son died.   Patient reports blood pressures are doing better since she is taking her blood pressure medication consistently.  Reports blood pressures:  111/64, 146/85, 125/79, 138/80, 147/82, 149/83. Patient reports last follow up with her primary care provider was 02/03/22.   Offered patient referral for grief follow up with social worker: Patient declined referral to Education officer, museum stating she is handling things at this time.  Medications reviewed and discussed ongoing compliance Reviewed scheduled / upcoming provider appointments.  Advised patient, to continue to  monitor blood pressure daily and record, calling PCP for findings outside established parameters Active listening and support due to patients son / granddaughter recently passing away.            SDOH assessments and interventions completed:  No     Care Coordination Interventions:  Yes, provided   Follow up plan: Follow up call scheduled for 05/03/22    Encounter Outcome:  Pt. Visit Completed   Quinn Plowman RN,BSN,CCM [place] [phone] direct line

## 2022-03-15 DIAGNOSIS — M85851 Other specified disorders of bone density and structure, right thigh: Secondary | ICD-10-CM | POA: Diagnosis not present

## 2022-03-15 DIAGNOSIS — M85852 Other specified disorders of bone density and structure, left thigh: Secondary | ICD-10-CM | POA: Diagnosis not present

## 2022-03-21 ENCOUNTER — Ambulatory Visit: Payer: Medicare HMO | Admitting: Hematology and Oncology

## 2022-03-21 ENCOUNTER — Other Ambulatory Visit: Payer: Self-pay | Admitting: Hematology and Oncology

## 2022-03-21 NOTE — Telephone Encounter (Signed)
Refilled per MD note 03/18/21 "switched to letrozole 03/18/2021 due to diarrhea"

## 2022-03-31 ENCOUNTER — Ambulatory Visit (INDEPENDENT_AMBULATORY_CARE_PROVIDER_SITE_OTHER): Payer: Medicare HMO | Admitting: Family Medicine

## 2022-03-31 ENCOUNTER — Encounter: Payer: Self-pay | Admitting: Family Medicine

## 2022-03-31 VITALS — BP 122/84 | HR 95 | Temp 97.8°F | Ht 63.0 in | Wt 125.0 lb

## 2022-03-31 DIAGNOSIS — M8580 Other specified disorders of bone density and structure, unspecified site: Secondary | ICD-10-CM

## 2022-03-31 NOTE — Patient Instructions (Signed)
Your bones are thin enough to consider medicine to decrease the chance of a broken hip.    Check labs on the way out.   If your vitamin D is low, we needed to address that first.    If your vitamin D is normal, then I'll check wit Dr. Lindi Adie about options.   Either way, I would still take vitamin D and calcium and keep walking.    Take care.  Glad to see you.

## 2022-03-31 NOTE — Progress Notes (Signed)
D/w pt about mating mirtazapine, it helped with sleep.  Condolences re: her child's death.  She is trying to adjust the changes in her life.  D/w pt about DXA results and osteopenia/osteoporosis path/phys in general, including vit D and calcium use.   Reasonable to consider treatment but we need to check labs first.  Rationale discussed with patient. D/w pt about risk benefit, especially GI sx, jaw and long bone pathology.    Meds, vitals, and allergies reviewed.   ROS: Per HPI unless specifically indicated in ROS section   Nad Ncat Tearful but regains composure. Judgment intact.   30 minutes were devoted to patient care in this encounter (this includes time spent reviewing the patient's file/history, interviewing and examining the patient, counseling/reviewing plan with patient).

## 2022-04-01 LAB — BASIC METABOLIC PANEL
BUN: 15 mg/dL (ref 6–23)
CO2: 28 mEq/L (ref 19–32)
Calcium: 9.3 mg/dL (ref 8.4–10.5)
Chloride: 103 mEq/L (ref 96–112)
Creatinine, Ser: 0.86 mg/dL (ref 0.40–1.20)
GFR: 61.23 mL/min (ref >60.00)
Glucose, Bld: 113 mg/dL — ABNORMAL HIGH (ref 70–99)
Potassium: 3.7 mEq/L (ref 3.5–5.1)
Sodium: 141 mEq/L (ref 135–145)

## 2022-04-01 LAB — VITAMIN D 25 HYDROXY (VIT D DEFICIENCY, FRACTURES): VITD: 54.05 ng/mL (ref 30.00–100.00)

## 2022-04-03 ENCOUNTER — Telehealth: Payer: Self-pay | Admitting: Family Medicine

## 2022-04-03 NOTE — Telephone Encounter (Signed)
Dr. Samson Frederic saw this patient recently and her fracture risk is high enough to consider treatment.  Her vitamin D level is normal, as is her calcium and creatinine.  The patient and I agreed that I would check labs and update you.  If you have specific preference between bisphosphonate versus Prolia, please let me know.  I will update the patient.  Thanks

## 2022-04-03 NOTE — Assessment & Plan Note (Signed)
We agreed to check labs and then for me to update Dr. Lindi Adie.    Her  bones are thin enough to consider medicine to decrease the chance of a broken hip, discussed.  If vitamin D is low, we needed to address that first.    If your vitamin D is normal, then I'll check wit Dr. Lindi Adie about options.   Discussed weightbearing exercise.

## 2022-04-06 NOTE — Telephone Encounter (Signed)
Please update patient.  I checked with Dr. Lindi Adie.  His preference was for Prolia and I think this is a good/reasonable option.  Please offer this to the patient.  If she wants to proceed then we can set it up here.  Either way, please let me know which way she wants to go.  Thanks.

## 2022-04-07 NOTE — Telephone Encounter (Signed)
Spoke with patient and she is okay with doing prolia

## 2022-04-08 NOTE — Telephone Encounter (Signed)
Please start process for prolia here in clinic.  Thanks.

## 2022-04-11 ENCOUNTER — Other Ambulatory Visit (HOSPITAL_COMMUNITY): Payer: Self-pay

## 2022-04-11 NOTE — Telephone Encounter (Signed)
Prolia VOB initiated via [email or web address] 

## 2022-04-11 NOTE — Telephone Encounter (Signed)
Pharmacy Patient Advocate Encounter  Insurance verification completed.    The patient is insured through [employer] test claims for: Prolia 58m.  Pharmacy benefit copay: $100.00

## 2022-04-13 NOTE — Telephone Encounter (Signed)
PA pending on Availity/Novologix

## 2022-04-14 NOTE — Telephone Encounter (Signed)
Patient Advocate Encounter  Prior Authorization for Prolia 15m has been approved.    PA# [id number] dates: 04/13/22 through 04/14/23

## 2022-04-15 ENCOUNTER — Other Ambulatory Visit: Payer: Self-pay

## 2022-04-15 DIAGNOSIS — M810 Age-related osteoporosis without current pathological fracture: Secondary | ICD-10-CM

## 2022-04-15 MED ORDER — DENOSUMAB 60 MG/ML ~~LOC~~ SOSY: 60.0000 mg | PREFILLED_SYRINGE | Freq: Once | SUBCUTANEOUS | 0 refills | Status: AC

## 2022-04-15 NOTE — Telephone Encounter (Signed)
Called patient to sch Prolia inj appt on 04/26/22.  Labs from 03/31/22 ok for Prolia inj.  Patient will bring inj from pharmacy.

## 2022-04-15 NOTE — Telephone Encounter (Signed)
Pt ready for scheduling for Prolia on or after : 04/15/22  Out-of-pocket cost due at time of visit: $327  Primary: [employer] Prolia co-insurance: 20% Admin fee co-insurance: 20%  Secondary: N/A Prolia co-insurance:  Admin fee co-insurance:   Medical Benefit Details: Date Benefits were checked: 04/11/22 Deductible: NO/ Coinsurance: 20%/ Admin Fee: 20%  Prior Auth: APPROVED  PA# [id number] Expiration Date: 04/14/2023   Pharmacy benefit: Copay $100 If patient wants fill through the pharmacy benefit please send prescription to: [employer], and include estimated need by date in rx notes. Pharmacy will ship medication directly to the office.  Patient NOT eligible for Prolia Copay Card. Copay Card can make patient's cost as little as $25. Link to apply: [email or web address]://[email or web address]  ** This summary of benefits is an estimation of the patient's out-of-pocket cost. Exact cost may very based on individual plan coverage.

## 2022-04-18 ENCOUNTER — Telehealth: Payer: Self-pay | Admitting: Family Medicine

## 2022-04-18 NOTE — Telephone Encounter (Signed)
[facility] at [phone]  and spoke to [facility] to verify our address for shipment of Prolia.  This will arrive at our office on 04/26/22.  Patient will come in for appt on 04/28/22 for inj.  Patient will be billed by mail $100.  If we do not get the Rx on 04/26/22, I will [facility] to notify [facility].  Spoke to patient over phone to notify her of all this.

## 2022-04-18 NOTE — Telephone Encounter (Signed)
[facility] called in stated [facility] [place] to [facility] with office address to mail pt RX Prolia injection . Because of insurance # [id number]

## 2022-04-20 ENCOUNTER — Ambulatory Visit: Payer: Medicare HMO

## 2022-04-26 ENCOUNTER — Other Ambulatory Visit: Payer: Self-pay | Admitting: Family Medicine

## 2022-04-26 ENCOUNTER — Ambulatory Visit: Payer: Medicare HMO

## 2022-04-28 ENCOUNTER — Ambulatory Visit (INDEPENDENT_AMBULATORY_CARE_PROVIDER_SITE_OTHER): Payer: Medicare HMO

## 2022-04-28 DIAGNOSIS — M810 Age-related osteoporosis without current pathological fracture: Secondary | ICD-10-CM | POA: Diagnosis not present

## 2022-04-28 MED ORDER — DENOSUMAB 60 MG/ML ~~LOC~~ SOSY
60.0000 mg | PREFILLED_SYRINGE | Freq: Once | SUBCUTANEOUS | Status: AC
  Administered 2022-04-28: 60 mg via SUBCUTANEOUS

## 2022-04-28 NOTE — Progress Notes (Signed)
Per orders of Dr. Elsie Stain, first injection of Prolia 60 mg [place] in left arm given by Ozzie Hoyle. Since first prolia injection pt waited 15 mins after injection and Patient tolerated injection well.

## 2022-05-03 ENCOUNTER — Ambulatory Visit: Payer: Self-pay

## 2022-05-03 NOTE — Patient Outreach (Signed)
  Care Coordination   05/03/2022 Name: OFA ISSA MRN: [id number] DOB: 04-20-1933   Care Coordination Outreach Attempts:  An unsuccessful telephone outreach was attempted for a scheduled appointment today. HIPAA compliant voice message left with call back phone number and return call request.   Follow Up Plan:  Additional outreach attempts will be made to offer the patient care coordination information and services.   Encounter Outcome:  No Answer   Care Coordination Interventions:  No, not indicated    Quinn Plowman [facility] [place] [phone] direct line

## 2022-05-10 ENCOUNTER — Other Ambulatory Visit: Payer: Self-pay | Admitting: Family Medicine

## 2022-05-10 DIAGNOSIS — E7800 Pure hypercholesterolemia, unspecified: Secondary | ICD-10-CM

## 2022-05-25 NOTE — Progress Notes (Signed)
Patient Care Team: Tonia Ghent, MD as PCP - General (Family Medicine) Monna Fam, MD as Consulting Physician (Ophthalmology) Laurence Spates, MD (Inactive) as Consulting Physician (Gastroenterology) Henreitta Leber, DDS as Referring Physician (Dentistry) Mauro Kaufmann, RN as Oncology Nurse Navigator Rockwell Germany, RN as Oncology Nurse Navigator Dannielle Karvonen, RN as [place] [facility]  DIAGNOSIS: No diagnosis found.  SUMMARY OF ONCOLOGIC HISTORY: Oncology History  Malignant neoplasm of upper-inner quadrant of right breast in female, estrogen receptor positive ([place])  02/13/2019 Initial Diagnosis   Routine screening mammogram detected 1.5cm right breast mass, 2.3cm on diagnostic mammogram, at the 1 o'clock position. Biopsy showed IDC, grade 2, HER-2 + by FISH, ER+ 100%, PR+ 90%, Ki67 15%.    03/19/2019 Surgery   Right mastectomy [name]): IDC, 2.6cm, grade 2, clear margins, 6 lymph nodes negative for carcinoma.    03/28/2019 -  Anti-estrogen oral therapy   Anastrozole    04/16/2019 - 02/25/2020 Chemotherapy   Patient is on Treatment Plan : BREAST Trastuzumab q21d      Genetic Testing   Negative genetic testing. No pathogenic variants identified on the [facility] STAT Breast cancer panel + Common Hereditary Cancers Panel. VUS in BRCA2 called c.5378A>G and a VUS in MUTYH called c.925C>T identified. The report date is 02/28/2019.   The STAT Breast cancer panel offered by [facility] includes sequencing and rearrangement analysis for the following 9 genes:  ATM, BRCA1, BRCA2, CDH1, CHEK2, PALB2, PTEN, STK11 and TP53.    The Common Hereditary Cancers Panel offered by [facility] includes sequencing and/or deletion duplication testing of the following 48 genes: APC, ATM, AXIN2, BARD1, BMPR1A, BRCA1, BRCA2, BRIP1, CDH1, CDKN2A (p14ARF), CDKN2A (p16INK4a), CKD4, CHEK2, CTNNA1, DICER1, EPCAM (Deletion/duplication testing only), GREM1 (promoter region  deletion/duplication testing only), KIT, MEN1, MLH1, MSH2, MSH3, MSH6, MUTYH, NBN, NF1, NHTL1, PALB2, PDGFRA, PMS2, POLD1, POLE, PTEN, RAD50, RAD51C, RAD51D, RNF43, SDHB, SDHC, SDHD, SMAD4, SMARCA4. STK11, TP53, TSC1, TSC2, and VHL.  The following genes were evaluated for sequence changes only: SDHA and HOXB13 c.251G>A variant only.     CHIEF COMPLIANT: Follow-up of right breast cancer   INTERVAL HISTORY: Amber [name] is a 89 y.o. with above-mentioned history of right breast cancer who underwent a mastectomy, and is currently on adjuvant Herceptin and antiestrogen therapy with anastrozole. She presents to the clinic today for a follow-up.    ALLERGIES:  is allergic to ace inhibitors, escitalopram oxalate, lipitor [atorvastatin calcium], zetia [ezetimibe], and zocor [simvastatin - high dose].  MEDICATIONS:  Current Outpatient Medications  Medication Sig Dispense Refill   amLODipine (NORVASC) 5 MG tablet Take 1 tablet (5 mg total) by mouth daily. 90 tablet 1   aspirin 325 MG tablet Take 325 mg by mouth daily.     calcium carbonate (CALCIUM 600) 600 MG TABS tablet Take 1 tablet (600 mg total) by mouth daily with breakfast.     Cholecalciferol 1000 units tablet Take 1 tablet (1,000 Units total) by mouth daily.     letrozole (FEMARA) 2.5 MG tablet TAKE 1 TABLET BY MOUTH EVERY DAY 90 tablet 3   mirtazapine (REMERON) 7.5 MG tablet TAKE 1 TABLET BY MOUTH AT BEDTIME. 90 tablet 0   Multiple Vitamin (MULTIVITAMIN) tablet Take 1 tablet by mouth daily.     omega-3 acid ethyl esters (LOVAZA) 1 g capsule Take 1 capsule (1 g total) by mouth daily. 90 capsule 3   rosuvastatin (CRESTOR) 5 MG tablet TAKE 1 TABLET BY MOUTH TWICE WEEKLY 24 tablet 3  saccharomyces boulardii (FLORASTOR) 250 MG capsule Take 1 capsule (250 mg total) by mouth daily. 60 capsule 3   No current facility-administered medications for this visit.    PHYSICAL EXAMINATION: ECOG PERFORMANCE STATUS: {CHL ONC ECOG  PS:[phone]}  There were no vitals filed for this visit. There were no vitals filed for this visit.  BREAST:*** No palpable masses or nodules in either right or left breasts. No palpable axillary supraclavicular or infraclavicular adenopathy no breast tenderness or nipple discharge. (exam performed in the presence of a chaperone)  LABORATORY DATA:  I have reviewed the data as listed    Latest Ref Rng & Units 03/31/2022    3:16 PM 06/03/2021    9:18 AM 02/25/2020   12:14 PM  CMP  Glucose 70 - 99 mg/dL 113  114  124   BUN 6 - 23 mg/dL 15  17  12    Creatinine 0.40 - 1.20 mg/dL 0.86  0.85  1.15   Sodium 135 - 145 mEq/L 141  141  141   Potassium 3.5 - 5.1 mEq/L 3.7  4.2  3.6   Chloride 96 - 112 mEq/L 103  103  105   CO2 19 - 32 mEq/L 28  27  30    Calcium 8.4 - 10.5 mg/dL 9.3  9.8  9.5   Total Protein 6.0 - 8.3 g/dL  7.1  7.4   Total Bilirubin 0.2 - 1.2 mg/dL  0.6  0.4   Alkaline Phos 39 - 117 U/L  98  84   AST 0 - 37 U/L  24  16   ALT 0 - 35 U/L  13  12     Lab Results  Component Value Date   WBC 7.4 06/03/2021   HGB 14.0 06/03/2021   HCT 41.4 06/03/2021   MCV 90.1 06/03/2021   PLT 322.0 06/03/2021   NEUTROABS 4.3 06/03/2021    ASSESSMENT & PLAN:  No problem-specific Assessment & Plan notes found for this encounter.    No orders of the defined types were placed in this encounter.  The patient has a good understanding of the overall plan. she agrees with it. she will call with any problems that may develop before the next visit here. Total time spent: 30 mins including face to face time and time spent for planning, charting and co-ordination of care   Suzzette Righter, [place] 05/25/22    I Gardiner Coins am acting as a Education administrator for [employer]  ***

## 2022-05-31 ENCOUNTER — Telehealth: Payer: Self-pay | Admitting: Family Medicine

## 2022-05-31 ENCOUNTER — Other Ambulatory Visit: Payer: Self-pay

## 2022-05-31 ENCOUNTER — Inpatient Hospital Stay: Payer: Medicare HMO | Attending: Hematology and Oncology | Admitting: Hematology and Oncology

## 2022-05-31 VITALS — BP 145/71 | HR 87 | Temp 97.8°F | Resp 18 | Ht 63.0 in | Wt 127.3 lb

## 2022-05-31 DIAGNOSIS — M8580 Other specified disorders of bone density and structure, unspecified site: Secondary | ICD-10-CM | POA: Diagnosis not present

## 2022-05-31 DIAGNOSIS — Z9011 Acquired absence of right breast and nipple: Secondary | ICD-10-CM | POA: Diagnosis not present

## 2022-05-31 DIAGNOSIS — Z170 Estrogen receptor positive status [ER+]: Secondary | ICD-10-CM | POA: Diagnosis not present

## 2022-05-31 DIAGNOSIS — C50211 Malignant neoplasm of upper-inner quadrant of right female breast: Secondary | ICD-10-CM | POA: Insufficient documentation

## 2022-05-31 DIAGNOSIS — Z79811 Long term (current) use of aromatase inhibitors: Secondary | ICD-10-CM | POA: Insufficient documentation

## 2022-05-31 NOTE — Telephone Encounter (Signed)
Contacted [name] to schedule their annual wellness visit. Appointment made for 06/07/2022.  [place] Direct Dial: [phone]

## 2022-05-31 NOTE — Assessment & Plan Note (Addendum)
03/09/2019: Right mastectomy Amber [name]): IDC, 2.6cm, grade 2, clear margins, 6 lymph nodes negative for carcinoma.,  ER 100%, PR 90%, HER-2 equivocal by IHC, FISH positive ratio 2.76, copy #6.2, Ki-67 15%   Treatment plan: 1. Adjuvant Herceptin every 3 weeks. Subcutaneously starting 04/16/2019 completed 02/25/2020 2. Adjuvant antiestrogen therapy with anastrozole 1 mg daily starting 04/16/2019, switched to letrozole 03/18/2021 due to diarrhea   Letrozole toxicities:     Breast cancer surveillance: 1.  Mammogram: 03/01/2022: No evidence of malignancy at [facility], breast density category B 2. bone density: 03/15/2022: T score -1.6: Osteopenia: Recommend calcium and vitamin D 3.  Breast exam 05/31/2022: Benign   Return to clinic in 1 year for follow-up

## 2022-06-01 ENCOUNTER — Telehealth: Payer: Self-pay | Admitting: Hematology and Oncology

## 2022-06-01 NOTE — Telephone Encounter (Signed)
Received call from pt stating she will proceed with having her bone density injections with her PCP.  Pt states she will alert PCP office of decision and will f/u with them.

## 2022-06-01 NOTE — Telephone Encounter (Signed)
Called to schedule appointments per 4/2 los. At this time, the patient does not want to schedule any appointments due to wanting to get prolia injections through [facility]. Patient was trasnferred to [facility].

## 2022-06-04 ENCOUNTER — Other Ambulatory Visit: Payer: Self-pay | Admitting: Family Medicine

## 2022-06-06 ENCOUNTER — Telehealth: Payer: Self-pay | Admitting: *Deleted

## 2022-06-06 NOTE — Telephone Encounter (Signed)
Received call from pt stating she will proceed with receiving her Prolia injection with her PCP due to cheaper cost.  RN educated pt to contact our office if PCP is not able to supple injection. Pt verbalized understanding.

## 2022-06-07 ENCOUNTER — Ambulatory Visit (INDEPENDENT_AMBULATORY_CARE_PROVIDER_SITE_OTHER): Payer: Medicare HMO

## 2022-06-07 VITALS — Ht 63.0 in | Wt 127.0 lb

## 2022-06-07 DIAGNOSIS — Z0000 Encounter for general adult medical examination without abnormal findings: Secondary | ICD-10-CM

## 2022-06-07 NOTE — Patient Instructions (Signed)
Amber [name] , Thank you for taking time to come for your Medicare Wellness Visit. I appreciate your ongoing commitment to your health goals. Please review the following plan we discussed and let me know if I can assist you in the future.   These are the goals we discussed:  Goals       Increase water intake      Starting 09/06/2016, I will attempt to drink at least 6-8 glasses of water per day.       Patient Stated      Starting 09/12/2017, I will continue to take medications as prescribed.       Patient Stated      No new goals      Patient stated:  Regulate BP (pt-stated)      Care Coordination Interventions: Evaluation of current treatment plan related to hypertension self management and patient's adherence to plan as established by provider:  Patient reports recent loss of her granddaughter. She states her granddaughter got killed in a motorcycle accident 18 days after her son died.   Patient reports blood pressures are doing better since she is taking her blood pressure medication consistently.  Reports blood pressures:  111/64, 146/85, 125/79, 138/80, 147/82, 149/83. Patient reports last follow up with her primary care provider was 02/03/22.   Offered patient referral for grief follow up with social worker: Patient declined referral to Child psychotherapist stating she is handling things at this time.  Medications reviewed and discussed ongoing compliance Reviewed scheduled / upcoming provider appointments.  Advised patient, to continue to  monitor blood pressure daily and record, calling PCP for findings outside established parameters Active listening and support due to patients son / granddaughter recently passing away.            This is a list of the screening recommended for you and due dates:  Health Maintenance  Topic Date Due   DTaP/Tdap/Td vaccine (2 - Tdap) 06/21/2020   COVID-19 Vaccine (7 - 2023-24 season) 02/15/2022   Flu Shot  09/29/2022   Colon Cancer Screening  02/07/2023    Mammogram  03/02/2023   Medicare Annual Wellness Visit  06/07/2023   Pneumonia Vaccine  Completed   DEXA scan (bone density measurement)  Completed   Zoster (Shingles) Vaccine  Completed   HPV Vaccine  Aged Out    Advanced directives: Advance directive discussed with you today. Even though you declined this today, please call our office should you change your mind, and we can give you the proper paperwork for you to fill out.   Conditions/risks identified: Aim for 30 minutes of exercise or brisk walking, 6-8 glasses of water, and 5 servings of fruits and vegetables each day.   Next appointment: Follow up in one year for your annual wellness visit 06/08/23 @ 1:30 televisit   Preventive Care 65 Years and Older, Female Preventive care refers to lifestyle choices and visits with your health care provider that can promote health and wellness. What does preventive care include? A yearly physical exam. This is also called an annual well check. Dental exams once or twice a year. Routine eye exams. Ask your health care provider how often you should have your eyes checked. Personal lifestyle choices, including: Daily care of your teeth and gums. Regular physical activity. Eating a healthy diet. Avoiding tobacco and drug use. Limiting alcohol use. Practicing safe sex. Taking low-dose aspirin every day. Taking vitamin and mineral supplements as recommended by your health care provider. What happens during  an annual well check? The services and screenings done by your health care provider during your annual well check will depend on your age, overall health, lifestyle risk factors, and family history of disease. Counseling  Your health care provider may ask you questions about your: Alcohol use. Tobacco use. Drug use. Emotional well-being. Home and relationship well-being. Sexual activity. Eating habits. History of falls. Memory and ability to understand (cognition). Work and work  Astronomer. Reproductive health. Screening  You may have the following tests or measurements: Height, weight, and BMI. Blood pressure. Lipid and cholesterol levels. These may be checked every 5 years, or more frequently if you are over 19 years old. Skin check. Lung cancer screening. You may have this screening every year starting at age 78 if you have a 30-pack-year history of smoking and currently smoke or have quit within the past 15 years. Fecal occult blood test (FOBT) of the stool. You may have this test every year starting at age 26. Flexible sigmoidoscopy or colonoscopy. You may have a sigmoidoscopy every 5 years or a colonoscopy every 10 years starting at age 50. Hepatitis C blood test. Hepatitis B blood test. Sexually transmitted disease (STD) testing. Diabetes screening. This is done by checking your blood sugar (glucose) after you have not eaten for a while (fasting). You may have this done every 1-3 years. Bone density scan. This is done to screen for osteoporosis. You may have this done starting at age 40. Mammogram. This may be done every 1-2 years. Talk to your health care provider about how often you should have regular mammograms. Talk with your health care provider about your test results, treatment options, and if necessary, the need for more tests. Vaccines  Your health care provider may recommend certain vaccines, such as: Influenza vaccine. This is recommended every year. Tetanus, diphtheria, and acellular pertussis (Tdap, Td) vaccine. You may need a Td booster every 10 years. Zoster vaccine. You may need this after age 9. Pneumococcal 13-valent conjugate (PCV13) vaccine. One dose is recommended after age 68. Pneumococcal polysaccharide (PPSV23) vaccine. One dose is recommended after age 50. Talk to your health care provider about which screenings and vaccines you need and how often you need them. This information is not intended to replace advice given to you by  your health care provider. Make sure you discuss any questions you have with your health care provider. Document Released: 03/13/2015 Document Revised: 11/04/2015 Document Reviewed: 12/16/2014 Elsevier Interactive Patient Education  2017 [employer].  Fall Prevention in the Home Falls can cause injuries. They can happen to people of all ages. There are many things you can do to make your home safe and to help prevent falls. What can I do on the outside of my home? Regularly fix the edges of walkways and driveways and fix any cracks. Remove anything that might make you trip as you walk through a door, such as a raised step or threshold. Trim any bushes or trees on the path to your home. Use bright outdoor lighting. Clear any walking paths of anything that might make someone trip, such as rocks or tools. Regularly check to see if handrails are loose or broken. Make sure that both sides of any steps have handrails. Any raised decks and porches should have guardrails on the edges. Have any leaves, snow, or ice cleared regularly. Use sand or salt on walking paths during winter. Clean up any spills in your garage right away. This includes oil or grease spills. What can  I do in the bathroom? Use night lights. Install grab bars by the toilet and in the tub and shower. Do not use towel bars as grab bars. Use non-skid mats or decals in the tub or shower. If you need to sit down in the shower, use a plastic, non-slip stool. Keep the floor dry. Clean up any water that spills on the floor as soon as it happens. Remove soap buildup in the tub or shower regularly. Attach bath mats securely with double-sided non-slip rug tape. Do not have throw rugs and other things on the floor that can make you trip. What can I do in the bedroom? Use night lights. Make sure that you have a light by your bed that is easy to reach. Do not use any sheets or blankets that are too big for your bed. They should not hang  down onto the floor. Have a firm chair that has side arms. You can use this for support while you get dressed. Do not have throw rugs and other things on the floor that can make you trip. What can I do in the kitchen? Clean up any spills right away. Avoid walking on wet floors. Keep items that you use a lot in easy-to-reach places. If you need to reach something above you, use a strong step stool that has a grab bar. Keep electrical cords out of the way. Do not use floor polish or wax that makes floors slippery. If you must use wax, use non-skid floor wax. Do not have throw rugs and other things on the floor that can make you trip. What can I do with my stairs? Do not leave any items on the stairs. Make sure that there are handrails on both sides of the stairs and use them. Fix handrails that are broken or loose. Make sure that handrails are as long as the stairways. Check any carpeting to make sure that it is firmly attached to the stairs. Fix any carpet that is loose or worn. Avoid having throw rugs at the top or bottom of the stairs. If you do have throw rugs, attach them to the floor with carpet tape. Make sure that you have a light switch at the top of the stairs and the bottom of the stairs. If you do not have them, ask someone to add them for you. What else can I do to help prevent falls? Wear shoes that: Do not have high heels. Have rubber bottoms. Are comfortable and fit you well. Are closed at the toe. Do not wear sandals. If you use a stepladder: Make sure that it is fully opened. Do not climb a closed stepladder. Make sure that both sides of the stepladder are locked into place. Ask someone to hold it for you, if possible. Clearly mark and make sure that you can see: Any grab bars or handrails. First and last steps. Where the edge of each step is. Use tools that help you move around (mobility aids) if they are needed. These  include: Canes. Walkers. Scooters. Crutches. Turn on the lights when you go into a dark area. Replace any light bulbs as soon as they burn out. Set up your furniture so you have a clear path. Avoid moving your furniture around. If any of your floors are uneven, fix them. If there are any pets around you, be aware of where they are. Review your medicines with your doctor. Some medicines can make you feel dizzy. This can increase your chance of falling.  Ask your doctor what other things that you can do to help prevent falls. This information is not intended to replace advice given to you by your health care provider. Make sure you discuss any questions you have with your health care provider. Document Released: 12/11/2008 Document Revised: 07/23/2015 Document Reviewed: 03/21/2014 Elsevier Interactive Patient Education  2017 [employer].

## 2022-06-07 NOTE — Progress Notes (Signed)
I connected with  Joetta Manners on 06/07/22 by a audio enabled telemedicine application and verified that I am speaking with the correct person using two identifiers.  Patient Location: Home  Provider Location: Office/Clinic  I discussed the limitations of evaluation and management by telemedicine. The patient expressed understanding and agreed to proceed.  Subjective:   NORELLE RUNNION is a 89 y.o. female who presents for Medicare Annual (Subsequent) preventive examination.  Review of Systems      Cardiac Risk Factors include: advanced age (>75men, >45 women);sedentary lifestyle     Objective:    Today's Vitals   06/07/22 1336  Weight: 127 lb (57.6 kg)  Height:  (1.6 m)   Body mass index is 22.5 kg/m.     06/07/2022    1:49 PM 02/25/2020    2:27 PM 01/14/2020    1:58 PM 12/24/2019    1:41 PM 10/22/2019    2:09 PM 09/09/2019    2:27 PM 08/20/2019    3:08 PM  Advanced Directives  Does Patient Have a Medical Advance Directive? No Yes Yes Yes Yes Yes No  Type of Special educational needs teacher of [place];Living will Healthcare Power of [place];Living will Healthcare Power of [employer] Power of [employer] Power of Attorney   Does patient want to make changes to medical advance directive?  No - Patient declined No - Patient declined No - Patient declined     Copy of Healthcare Power of Attorney in Chart?   No - copy requested No - copy requested No - copy requested No - copy requested   Would patient like information on creating a medical advance directive? No - Patient declined     No - Patient declined No - Patient declined    Current Medications (verified) Outpatient Encounter Medications as of 06/07/2022  Medication Sig   amLODipine (NORVASC) 5 MG tablet TAKE 1 TABLET (5 MG TOTAL) BY MOUTH DAILY.   aspirin 325 MG tablet Take 325 mg by mouth daily.   calcium carbonate (CALCIUM 600) 600 MG TABS tablet Take 1 tablet (600 mg total) by mouth daily  with breakfast.   Cholecalciferol 1000 units tablet Take 1 tablet (1,000 Units total) by mouth daily.   letrozole (FEMARA) 2.5 MG tablet TAKE 1 TABLET BY MOUTH EVERY DAY   Multiple Vitamin (MULTIVITAMIN) tablet Take 1 tablet by mouth daily.   omega-3 acid ethyl esters (LOVAZA) 1 g capsule Take 1 capsule (1 g total) by mouth daily.   PROLIA 60 MG/ML SOSY injection Inject into the skin.   rosuvastatin (CRESTOR) 5 MG tablet TAKE 1 TABLET BY MOUTH TWICE WEEKLY   mirtazapine (REMERON) 7.5 MG tablet TAKE 1 TABLET BY MOUTH AT BEDTIME. (Patient not taking: Reported on 06/07/2022)   saccharomyces boulardii (FLORASTOR) 250 MG capsule Take 1 capsule (250 mg total) by mouth daily. (Patient not taking: Reported on 06/07/2022)   No facility-administered encounter medications on file as of 06/07/2022.    Allergies (verified) Ace inhibitors, Escitalopram oxalate, Lipitor [atorvastatin calcium], Zetia [ezetimibe], and Zocor [simvastatin - high dose]   History: Past Medical History:  Diagnosis Date   Anemia    Blood transfusion without reported diagnosis    during treatment for colon CA   Breast cancer 2020   right breast IDC   C. difficile colitis    history of   Cancer    Colon, s/p partial colectomy, no chemo or radiation   Diverticulitis    Family history of bladder cancer  Family history of breast cancer    Family history of colon cancer    Family history of throat cancer    Hyperlipidemia    Hypertension    Urinary incontinence    stress incont.   Urinary tract infection    Past Surgical History:  Procedure Laterality Date   ABDOMINAL HYSTERECTOMY     BLADDER SUSPENSION     CARDIOVASCULAR STRESS TEST  11/02/2004   EF 76%   CATARACT EXTRACTION     COLON SURGERY  2010   colon cancer   MASTECTOMY W/ SENTINEL NODE BIOPSY Right 03/19/2019   Procedure: RIGHT MASTECTOMY WITH RIGHT AXILLARY SENTINEL LYMPH NODE BIOPSY;  Surgeon: Emelia LoronWakefield, Matthew, MD;  Location: [facility];   Service: General;  Laterality: Right;   Family History  Problem Relation Age of Onset   Cancer Mother        breast cancer   Breast cancer Mother    Cancer Father        colon cancer   Stroke Father    Colon cancer Father    Cancer Brother        bladder cancer   Cancer Brother        throat cancer   Social History   Socioeconomic History   Marital status: Widowed    Spouse name: Not on file   Number of children: Not on file   Years of education: Not on file   Highest education level: Not on file  Occupational History   Not on file  Tobacco Use   Smoking status: Never   Smokeless tobacco: Never  Vaping Use   Vaping Use: Never used  Substance and Sexual Activity   Alcohol use: No   Drug use: No   Sexual activity: Not Currently    Birth control/protection: Surgical  Other Topics Concern   Not on file  Social History Narrative   Widowed, married 1956   Initially had 1 son, who died 2023.    Granddaughter died 2023 in MVA.     Social Determinants of Health   Financial Resource Strain: Low Risk  (06/07/2022)   Overall Financial Resource Strain (CARDIA)    Difficulty of Paying Living Expenses: Not hard at all  Food Insecurity: No Food Insecurity (06/07/2022)   Hunger Vital Sign    Worried About Running Out of Food in the Last Year: Never true    Ran Out of Food in the Last Year: Never true  Transportation Needs: No Transportation Needs (06/07/2022)   PRAPARE - Administrator, Civil ServiceTransportation    Lack of Transportation (Medical): No    Lack of Transportation (Non-Medical): No  Physical Activity: Inactive (06/07/2022)   Exercise Vital Sign    Days of Exercise per Week: 0 days    Minutes of Exercise per Session: 0 min  Stress: No Stress Concern Present (06/07/2022)   [employer]ational [employer] - Occupational Stress Questionnaire    Feeling of Stress : Not at all  Social Connections: Moderately Isolated (06/07/2022)   Social Connection and Isolation Panel [NHANES]    Frequency  of Communication with Friends and Family: More than three times a week    Frequency of Social Gatherings with Friends and Family: More than three times a week    Attends Religious Services: More than 4 times per year    Active Member of [employer] or Organizations: No    Attends BankerClub or Organization Meetings: Never    Marital Status: Widowed    Tobacco Counseling  Counseling given: Not Answered   Clinical Intake:  Pre-visit preparation completed: Yes  Pain : No/denies pain     Nutritional Risks: None Diabetes: No  How often do you need to have someone help you when you read instructions, pamphlets, or other written materials from your doctor or pharmacy?: 1 - Never  Diabetic? no  Interpreter Needed?: No  Information entered by :: C.Ji Feldner LPN   Activities of Daily Living    06/07/2022    1:51 PM  In your present state of health, do you have any difficulty performing the following activities:  Hearing? 0  Vision? 0  Difficulty concentrating or making decisions? 0  Walking or climbing stairs? 0  Dressing or bathing? 0  Doing errands, shopping? 0  Preparing Food and eating ? N  Using the Toilet? N  In the past six months, have you accidently leaked urine? N  Do you have problems with loss of bowel control? N  Managing your Medications? N  Managing your Finances? N  Housekeeping or managing your Housekeeping? N    Patient Care Team: Joaquim Nam, MD as PCP - General (Family Medicine) Mateo Flow, MD as Consulting Physician (Ophthalmology) Carman Ching, MD (Inactive) as Consulting Physician (Gastroenterology) Mickie Hillier, DDS as Referring Physician (Dentistry) Pershing Proud, RN as Oncology Nurse Navigator Donnelly Angelica, RN as Oncology Nurse Navigator Otho Ket, RN as [facility] Care Management  Indicate any recent Medical Services you may have received from other than [facility] providers in the past year (date may be approximate).      Assessment:   This is a routine wellness examination for [place].  Hearing/Vision screen Hearing Screening - Comments:: No aid Vision Screening - Comments:: Glasses - unknown provider  Dietary issues and exercise activities discussed: Current Exercise Habits: The patient does not participate in regular exercise at present, Exercise limited by: None identified   Goals Addressed             This Visit's Progress    Patient Stated       No new goals       Depression Screen    06/07/2022    1:49 PM 06/07/2022    1:48 PM 03/31/2022    2:43 PM 03/19/2020   10:00 AM 10/04/2018    4:01 PM 09/12/2017    9:24 AM 09/06/2016    1:47 PM  PHQ 2/9 Scores  PHQ - 2 Score 0 0 0 0 0 2 3  PHQ- 9 Score   6   5 9     Fall Risk    06/07/2022    1:51 PM 03/31/2022    2:42 PM 03/19/2020    9:59 AM 10/04/2018    4:00 PM 09/12/2017    9:24 AM  Fall Risk   Falls in the past year? 0 0 0 0 No  Number falls in past yr: 0 0 0 0   Injury with Fall? 0 0 0 0   Risk for fall due to : No Fall Risks No Fall Risks     Follow up Falls prevention discussed;Falls evaluation completed Falls evaluation completed Falls evaluation completed      FALL RISK PREVENTION PERTAINING TO THE HOME:  Any stairs in or around the home? No  If so, are there any without handrails? No  Home free of loose throw rugs in walkways, pet beds, electrical cords, etc? Yes  Adequate lighting in your home to reduce risk of falls? Yes  ASSISTIVE DEVICES UTILIZED TO PREVENT FALLS:  Life alert? Yes  Use of a cane, walker or w/c? No  Grab bars in the bathroom? Yes  Shower chair or bench in shower? Yes  Elevated toilet seat or a handicapped toilet? Yes    Cognitive Function:    09/12/2017    9:27 AM 09/06/2016    1:51 PM 08/24/2015    8:54 AM  MMSE - Mini Mental State Exam  Orientation to time 5 5 5   Orientation to Place 5 5 5   Registration 3 3 3   Attention/ Calculation 0 0 0  Recall 3 2 2   Recall-comments   pt was unable to  recall 1 of 3 words  Language- name 2 objects 0 0 0  Language- repeat 1 1 1   Language- follow 3 step command 3 3 3   Language- read & follow direction 0 0 0  Write a sentence 0 0 0  Copy design 0 0 0  Total score 20 19 19         06/07/2022    1:51 PM  6CIT Screen  What Year? 0 points  What month? 0 points  What time? 0 points  Count back from 20 0 points  Months in reverse 0 points  Repeat phrase 0 points  Total Score 0 points    Immunizations Immunization History  Administered Date(s) Administered   Covid-19, Mrna,Vaccine(Spikevax)80yrs and older 12/21/2021   DT (Pediatric) 06/22/2010   Influenza, High Dose Seasonal PF 11/22/2018, 12/21/2021   Influenza,inj,Quad PF,6+ Mos 12/23/2014, 11/27/2015, 12/15/2016, 11/30/2017   Influenza-Unspecified 12/10/2019, 12/16/2020   Moderna Sars-Covid-2 Vaccination 03/21/2019, 04/11/2019   PFIZER(Purple Top)SARS-COV-2 Vaccination 03/21/2019, 04/11/2019, 12/26/2019   Pneumococcal Conjugate-13 08/24/2015   Pneumococcal Polysaccharide-23 09/06/2016   Zoster Recombinat (Shingrix) 10/05/2020, 12/07/2020    TDAP status: Due, Education has been provided regarding the importance of this vaccine. Advised may receive this vaccine at local pharmacy or Health Dept. Aware to provide a copy of the vaccination record if obtained from local pharmacy or Health Dept. Verbalized acceptance and understanding.  Flu Vaccine status: Up to date  Pneumococcal vaccine status: Up to date  Covid-19 vaccine status: Information provided on how to obtain vaccines.   Qualifies for Shingles Vaccine? Yes   Zostavax completed Yes   Shingrix Completed?: Yes  Screening Tests Health Maintenance  Topic Date Due   DTaP/Tdap/Td (2 - Tdap) 06/21/2020   COVID-19 Vaccine (7 - 2023-24 season) 02/15/2022   INFLUENZA VACCINE  09/29/2022   COLONOSCOPY (Pts 45-63yrs Insurance coverage will need to be confirmed)  02/07/2023   MAMMOGRAM  03/02/2023   Medicare Annual Wellness  (AWV)  06/07/2023   Pneumonia Vaccine 62+ Years old  Completed   DEXA SCAN  Completed   Zoster Vaccines- Shingrix  Completed   HPV VACCINES  Aged Out    Health Maintenance  Health Maintenance Due  Topic Date Due   DTaP/Tdap/Td (2 - Tdap) 06/21/2020   COVID-19 Vaccine (7 - 2023-24 season) 02/15/2022    Colorectal cancer screening: No longer required.   Mammogram status: Completed 01/0/24. Repeat every year  Bone Density status: Completed 03/15/22. Results reflect: Bone density results: OSTEOPENIA. Repeat every 2 years.  Lung Cancer Screening: (Low Dose CT Chest recommended if Age 87-80 years, 30 pack-year currently smoking OR have quit w/in 15years.) does not qualify.   Lung Cancer Screening Referral: no  Additional Screening:  Hepatitis C Screening: does not qualify; Completed no  Vision Screening: Recommended annual ophthalmology exams for early detection of glaucoma and  other disorders of the eye. Is the patient up to date with their annual eye exam?  Yes  Who is the provider or what is the name of the office in which the patient attends annual eye exams? Unknown If pt is not established with a provider, would they like to be referred to a provider to establish care? No .   Dental Screening: Recommended annual dental exams for proper oral hygiene  Community Resource Referral / Chronic Care Management: CRR required this visit?  No   CCM required this visit?  No      Plan:     I have personally reviewed and noted the following in the patient's chart:   Medical and social history Use of alcohol, tobacco or illicit drugs  Current medications and supplements including opioid prescriptions. Patient is not currently taking opioid prescriptions. Functional ability and status Nutritional status Physical activity Advanced directives List of other physicians Hospitalizations, surgeries, and ER visits in previous 12 months Vitals Screenings to include cognitive,  depression, and falls Referrals and appointments  In addition, I have reviewed and discussed with patient certain preventive protocols, quality metrics, and best practice recommendations. A written personalized care plan for preventive services as well as general preventive health recommendations were provided to patient.     Maryan Puls, LPN   0/10/3233   Nurse Notes: none

## 2022-09-26 ENCOUNTER — Telehealth: Payer: Self-pay | Admitting: Family Medicine

## 2022-09-26 NOTE — Telephone Encounter (Signed)
Pt called to schedule prolia inj? Pt states she's aware the inj has to get approved by insurance but needs guidance on how to get the inj process started. Please advise. Call back # [phone]

## 2022-09-27 NOTE — Telephone Encounter (Signed)
Called patient let know we are sending to run benefits and will reach out once that has been received.

## 2022-09-28 ENCOUNTER — Telehealth: Payer: Self-pay

## 2022-09-28 ENCOUNTER — Other Ambulatory Visit (HOSPITAL_COMMUNITY): Payer: Self-pay

## 2022-09-28 NOTE — Telephone Encounter (Signed)
PA#: [id number] 04/13/22-04/14/23

## 2022-09-28 NOTE — Telephone Encounter (Signed)
Created new encounter for Prolia BIV. Will route encounter back once benefit verification is complete.  

## 2022-09-28 NOTE — Telephone Encounter (Signed)
Pt ready for scheduling for PROLIA on or after : 10/26/22  Out-of-pocket cost due at time of visit: $345  Primary: [employer] Prolia co-insurance: 20% Admin fee co-insurance: 20%  Secondary: --- Prolia co-insurance:  Admin fee co-insurance:   Medical Benefit Details: Date Benefits were checked: 09/28/22 Deductible: NO/ Coinsurance: 20%/ Admin Fee: 20%  Prior Auth: APPROVED PA# [id number]  Expiration Date: 04/13/22-04/14/23  # of doses approved:2  Pharmacy benefit: Copay $--- (Too soon, next fill 10/12/22) If patient wants fill through the pharmacy benefit please send prescription to: [employer], and include estimated need by date in rx notes. Pharmacy will ship medication directly to the office.  Patient NOT eligible for Prolia Copay Card. Copay Card can make patient's cost as little as $25. Link to apply: [email or web address]://[email or web address]  ** This summary of benefits is an estimation of the patient's out-of-pocket cost. Exact cost may very based on individual plan coverage.

## 2022-09-28 NOTE — Telephone Encounter (Signed)
Prolia VOB initiated via [email or web address]  Last Prolia inj: 04/28/22 Next Prolia inj DUE: 10/26/22 

## 2022-10-01 ENCOUNTER — Other Ambulatory Visit: Payer: Self-pay | Admitting: Family Medicine

## 2022-10-03 ENCOUNTER — Other Ambulatory Visit: Payer: Self-pay | Admitting: Family Medicine

## 2022-10-07 NOTE — Telephone Encounter (Signed)
Please verify pharmacy co pay after 10/12/22

## 2022-10-14 ENCOUNTER — Other Ambulatory Visit (HOSPITAL_COMMUNITY): Payer: Self-pay

## 2022-10-14 NOTE — Telephone Encounter (Signed)
Pharmacy Patient Advocate Encounter  Insurance verification completed.    The patient is insured through [employer] claim for Prolia. Currently a quantity of 1ml is a 180 day supply and the co-pay is $100 .   This test claim was processed through [facility]- copay amounts may vary at other pharmacies due to pharmacy/plan contracts, or as the patient moves through the different stages of their insurance plan.

## 2022-10-19 NOTE — Telephone Encounter (Signed)
Called no answer no voicemail

## 2022-10-19 NOTE — Telephone Encounter (Signed)
Left message to return call to our office.  

## 2022-10-20 NOTE — Telephone Encounter (Signed)
Left message to return call to our office.  

## 2022-10-24 NOTE — Telephone Encounter (Signed)
Patient called in returning call she received. 

## 2022-10-25 NOTE — Telephone Encounter (Signed)
Patient returned call to [facility] to get set up for prolia

## 2022-11-03 ENCOUNTER — Other Ambulatory Visit: Payer: Self-pay

## 2022-11-03 ENCOUNTER — Other Ambulatory Visit (HOSPITAL_COMMUNITY): Payer: Self-pay

## 2022-11-03 DIAGNOSIS — M8580 Other specified disorders of bone density and structure, unspecified site: Secondary | ICD-10-CM

## 2022-11-03 MED ORDER — DENOSUMAB 60 MG/ML ~~LOC~~ SOSY
60.0000 mg | PREFILLED_SYRINGE | Freq: Once | SUBCUTANEOUS | 0 refills | Status: AC
Start: 2022-11-03 — End: 2022-11-03
  Filled 2022-11-03 (×2): qty 1, 1d supply, fill #0

## 2022-11-03 NOTE — Telephone Encounter (Signed)
Left message to return call to our office.  

## 2022-11-03 NOTE — Telephone Encounter (Signed)
Called patient reviewed all following information including appointment, Co pay due at time of visit and if pick up of injection is needed from outside pharmacy.     Out of pocket for patient:  Pharmacy co pay $100  Lab appointment : 11/04/22   Nurse visit:  11/10/22  Lab order placed: Yes  Prolia has been  []   Ordered  []   Script sent to local pharmacy for patient to bring   []   Script sent to Specialty pharmacy   [x]   Script sent to [facility] to deliver

## 2022-11-04 ENCOUNTER — Other Ambulatory Visit (INDEPENDENT_AMBULATORY_CARE_PROVIDER_SITE_OTHER): Payer: Medicare HMO

## 2022-11-04 ENCOUNTER — Other Ambulatory Visit: Payer: Self-pay

## 2022-11-04 ENCOUNTER — Other Ambulatory Visit (HOSPITAL_COMMUNITY): Payer: Self-pay

## 2022-11-04 DIAGNOSIS — M8580 Other specified disorders of bone density and structure, unspecified site: Secondary | ICD-10-CM | POA: Diagnosis not present

## 2022-11-04 LAB — BASIC METABOLIC PANEL
BUN: 14 mg/dL (ref 6–23)
CO2: 29 meq/L (ref 19–32)
Calcium: 9.1 mg/dL (ref 8.4–10.5)
Chloride: 104 meq/L (ref 96–112)
Creatinine, Ser: 0.88 mg/dL (ref 0.40–1.20)
GFR: 59.31 mL/min — ABNORMAL LOW (ref >60.00)
Glucose, Bld: 104 mg/dL — ABNORMAL HIGH (ref 70–99)
Potassium: 3.9 meq/L (ref 3.5–5.1)
Sodium: 139 meq/L (ref 135–145)

## 2022-11-05 ENCOUNTER — Other Ambulatory Visit (HOSPITAL_COMMUNITY): Payer: Self-pay

## 2022-11-09 ENCOUNTER — Telehealth: Payer: Self-pay | Admitting: Family Medicine

## 2022-11-09 NOTE — Telephone Encounter (Signed)
Scheduled pt for tomorrow, 9/12 with Alphonsus Sias

## 2022-11-09 NOTE — Telephone Encounter (Signed)
Patient called in and stated that she tested positive for Covid. She was wanting to know if Dr. Para March could send her a prescription in. Informed patient that she would need an appointment either virtually or in office. Patient stated that she don't want to come in the office due to sickness and she can't do a virtual. Please advise. Thank you!

## 2022-11-09 NOTE — Telephone Encounter (Signed)
Dr. Para March is out of the office today and will need to be seen in order to be given medication.

## 2022-11-10 ENCOUNTER — Ambulatory Visit (INDEPENDENT_AMBULATORY_CARE_PROVIDER_SITE_OTHER): Payer: Medicare HMO | Admitting: Internal Medicine

## 2022-11-10 ENCOUNTER — Encounter: Payer: Self-pay | Admitting: Internal Medicine

## 2022-11-10 ENCOUNTER — Ambulatory Visit: Payer: Medicare HMO

## 2022-11-10 VITALS — BP 110/78 | HR 88 | Temp 98.2°F | Ht 63.0 in | Wt 125.0 lb

## 2022-11-10 DIAGNOSIS — U071 COVID-19: Secondary | ICD-10-CM | POA: Diagnosis not present

## 2022-11-10 NOTE — Assessment & Plan Note (Signed)
Fairly mild and on day 4 now Has improved Will hold off on paxlovid Tylenol--needs proper dose OTC cough med prn To ER if worsens Isolate till next week

## 2022-11-10 NOTE — Progress Notes (Signed)
Subjective:    Patient ID: Amber [name], female    DOB: 09-Jan-1934, 89 y.o.   MRN: [id number]  HPI Here due to COVID infection  Started 3 days ago Chills and then sneezing and coughing Runny nose Went to [facility] for test yesterday--positive Headache--frontal Slight sore throat today No shakes or sweats. No myalgia Feels weak No SOB No ear pain  Using coricidin--not clearly helpful  Does seem some better today  Current Outpatient Medications on File Prior to Visit  Medication Sig Dispense Refill   amLODipine (NORVASC) 5 MG tablet TAKE 1 TABLET (5 MG TOTAL) BY MOUTH DAILY. 90 tablet 1   aspirin 325 MG tablet Take 325 mg by mouth daily.     calcium carbonate (CALCIUM 600) 600 MG TABS tablet Take 1 tablet (600 mg total) by mouth daily with breakfast.     Cholecalciferol 1000 units tablet Take 1 tablet (1,000 Units total) by mouth daily.     letrozole (FEMARA) 2.5 MG tablet TAKE 1 TABLET BY MOUTH EVERY DAY 90 tablet 3   mirtazapine (REMERON) 7.5 MG tablet TAKE 1 TABLET BY MOUTH AT BEDTIME. 90 tablet 0   Multiple Vitamin (MULTIVITAMIN) tablet Take 1 tablet by mouth daily.     omega-3 acid ethyl esters (LOVAZA) 1 g capsule Take 1 capsule (1 g total) by mouth daily. 90 capsule 3   PROLIA 60 MG/ML SOSY injection Inject into the skin.     rosuvastatin (CRESTOR) 5 MG tablet TAKE 1 TABLET BY MOUTH TWICE WEEKLY 24 tablet 3   saccharomyces boulardii (FLORASTOR) 250 MG capsule Take 1 capsule (250 mg total) by mouth daily. 60 capsule 3   No current facility-administered medications on file prior to visit.    Allergies  Allergen Reactions   Ace Inhibitors Cough   Escitalopram Oxalate Other (See Comments)    constipation   Lipitor [Atorvastatin Calcium] Other (See Comments)    constipation   Zetia [Ezetimibe] Other (See Comments)    constipation   Zocor [Simvastatin - High Dose] Other (See Comments)    constipation    Past Medical History:  Diagnosis Date   Anemia    Blood  transfusion without reported diagnosis    during treatment for colon CA   Breast cancer (HCC) 2020   right breast IDC   C. difficile colitis    history of   Cancer (HCC)    Colon, s/p partial colectomy, no chemo or radiation   Diverticulitis    Family history of bladder cancer    Family history of breast cancer    Family history of colon cancer    Family history of throat cancer    Hyperlipidemia    Hypertension    Urinary incontinence    stress incont.   Urinary tract infection     Past Surgical History:  Procedure Laterality Date   ABDOMINAL HYSTERECTOMY     BLADDER SUSPENSION     CARDIOVASCULAR STRESS TEST  11/02/2004   EF 76%   CATARACT EXTRACTION     COLON SURGERY  2010   colon cancer   MASTECTOMY W/ SENTINEL NODE BIOPSY Right 03/19/2019   Procedure: RIGHT MASTECTOMY WITH RIGHT AXILLARY SENTINEL LYMPH NODE BIOPSY;  Surgeon: Emelia Loron, MD;  Location: [facility];  Service: General;  Laterality: Right;    Family History  Problem Relation Age of Onset   Cancer Mother        breast cancer   Breast cancer Mother    Cancer Father  colon cancer   Stroke Father    Colon cancer Father    Cancer Brother        bladder cancer   Cancer Brother        throat cancer    Social History   Socioeconomic History   Marital status: Widowed    Spouse name: Not on file   Number of children: Not on file   Years of education: Not on file   Highest education level: Not on file  Occupational History   Not on file  Tobacco Use   Smoking status: Never   Smokeless tobacco: Never  Vaping Use   Vaping status: Never Used  Substance and Sexual Activity   Alcohol use: No   Drug use: No   Sexual activity: Not Currently    Birth control/protection: Surgical  Other Topics Concern   Not on file  Social History Narrative   Widowed, married 27-Nov-1954   Initially had 1 son, who died 2021-11-26.    Granddaughter died Nov 26, 2021 in MVA.     Social Determinants of Health    Financial Resource Strain: Low Risk  (06/07/2022)   Overall Financial Resource Strain (CARDIA)    Difficulty of Paying Living Expenses: Not hard at all  Food Insecurity: No Food Insecurity (06/07/2022)   Hunger Vital Sign    Worried About Running Out of Food in the Last Year: Never true    Ran Out of Food in the Last Year: Never true  Transportation Needs: No Transportation Needs (06/07/2022)   PRAPARE - Administrator, Civil Service (Medical): No    Lack of Transportation (Non-Medical): No  Physical Activity: Inactive (06/07/2022)   Exercise Vital Sign    Days of Exercise per Week: 0 days    Minutes of Exercise per Session: 0 min  Stress: No Stress Concern Present (06/07/2022)   [employer] of Occupational Health - Occupational Stress Questionnaire    Feeling of Stress : Not at all  Social Connections: Moderately Isolated (06/07/2022)   Social Connection and Isolation Panel [NHANES]    Frequency of Communication with Friends and Family: More than three times a week    Frequency of Social Gatherings with Friends and Family: More than three times a week    Attends Religious Services: More than 4 times per year    Active Member of [employer] or Organizations: No    Attends Banker Meetings: Never    Marital Status: Widowed  Intimate Partner Violence: Not At Risk (06/07/2022)   Humiliation, Afraid, Rape, and Kick questionnaire    Fear of Current or Ex-Partner: No    Emotionally Abused: No    Physically Abused: No    Sexually Abused: No   Review of Systems Appetite is poor --having some soup Has lost taste to some degree No N/V    Objective:   Physical Exam Constitutional:      Appearance: Normal appearance.  HENT:     Head:     Comments: No sinus tenderness    Right Ear: Tympanic membrane and ear canal normal.     Left Ear: Tympanic membrane and ear canal normal.     Mouth/Throat:     Comments: Slight pharyngeal injection Pulmonary:     Effort: Pulmonary  effort is normal.     Breath sounds: Normal breath sounds. No wheezing or rales.  Musculoskeletal:     Cervical back: Neck supple.  Lymphadenopathy:     Cervical: No cervical adenopathy.  Neurological:  Mental Status: She is alert.            Assessment & Plan:

## 2022-11-17 ENCOUNTER — Ambulatory Visit: Payer: Medicare HMO

## 2022-11-17 DIAGNOSIS — M8580 Other specified disorders of bone density and structure, unspecified site: Secondary | ICD-10-CM

## 2022-11-17 MED ORDER — DENOSUMAB 60 MG/ML ~~LOC~~ SOSY
60.0000 mg | PREFILLED_SYRINGE | Freq: Once | SUBCUTANEOUS | Status: AC
Start: 2022-11-17 — End: 2022-11-17
  Administered 2022-11-17: 60 mg via SUBCUTANEOUS

## 2022-11-17 NOTE — Progress Notes (Signed)
Per orders of Dr. Crawford Givens, injection of prolia 60 mg given by Lewanda Rife in right arm Patient tolerated injection well. Patient will make appointment for 6 month.

## 2022-11-18 ENCOUNTER — Other Ambulatory Visit: Payer: Self-pay

## 2022-11-19 ENCOUNTER — Encounter (HOSPITAL_COMMUNITY): Payer: Self-pay

## 2022-12-07 ENCOUNTER — Other Ambulatory Visit: Payer: Self-pay

## 2022-12-12 ENCOUNTER — Other Ambulatory Visit (HOSPITAL_COMMUNITY): Payer: Self-pay

## 2022-12-15 DIAGNOSIS — D485 Neoplasm of uncertain behavior of skin: Secondary | ICD-10-CM | POA: Diagnosis not present

## 2022-12-15 DIAGNOSIS — L570 Actinic keratosis: Secondary | ICD-10-CM | POA: Diagnosis not present

## 2022-12-15 DIAGNOSIS — L821 Other seborrheic keratosis: Secondary | ICD-10-CM | POA: Diagnosis not present

## 2022-12-15 DIAGNOSIS — L814 Other melanin hyperpigmentation: Secondary | ICD-10-CM | POA: Diagnosis not present

## 2023-01-17 ENCOUNTER — Telehealth: Payer: Self-pay | Admitting: Family Medicine

## 2023-01-17 NOTE — Telephone Encounter (Signed)
Left message to return call to our office.  

## 2023-01-17 NOTE — Telephone Encounter (Signed)
Patient called and wanted to know the last time she had a vaccine for pneumonia, informed pt that according to records the last we show was 2018. Patient would like to know if it is time fr another one? Please advise

## 2023-01-17 NOTE — Telephone Encounter (Signed)
Does not need another PNA vaccine.  She is up to date. Thanks.

## 2023-01-17 NOTE — Telephone Encounter (Signed)
Patient returned call,I let her know that she is up to date on her PN vaccine per Dr. Para March.

## 2023-03-08 ENCOUNTER — Encounter: Payer: Self-pay | Admitting: Hematology and Oncology

## 2023-03-08 NOTE — Progress Notes (Signed)
 Patient called about appointment scheduled for this year. I didn't see an appointment. Message sent to [facility] in scheduling to contact patient for follow up.

## 2023-03-11 ENCOUNTER — Other Ambulatory Visit: Payer: Self-pay | Admitting: Hematology and Oncology

## 2023-04-07 ENCOUNTER — Other Ambulatory Visit: Payer: Self-pay | Admitting: Family Medicine

## 2023-04-07 DIAGNOSIS — E7800 Pure hypercholesterolemia, unspecified: Secondary | ICD-10-CM

## 2023-04-07 NOTE — Telephone Encounter (Signed)
 Last office visit: 03/31/22 Next office visit:nothing scheduled Last refill: rosuvastatin  (CRESTOR ) 5 MG tablet 05/10/22 24 tablets 3 refills

## 2023-04-26 ENCOUNTER — Other Ambulatory Visit: Payer: Self-pay

## 2023-04-27 MED ORDER — DENOSUMAB 60 MG/ML ~~LOC~~ SOSY
60.0000 mg | PREFILLED_SYRINGE | Freq: Once | SUBCUTANEOUS | Status: DC
Start: 2023-05-11 — End: 2023-12-11

## 2023-04-27 NOTE — Addendum Note (Signed)
 Addended by: Donnamarie Poag on: 04/27/2023 08:03 AM   Modules accepted: Orders

## 2023-04-28 ENCOUNTER — Telehealth: Payer: Self-pay

## 2023-04-28 NOTE — Telephone Encounter (Signed)
 please schedule yearly visit with labs ahead of time when possible.  thanks -Per message from Dr Para March  Please help her get scheduled. Thank you.

## 2023-04-28 NOTE — Telephone Encounter (Signed)
 Spoke to pt, scheduled cpe for 06/08/23

## 2023-05-02 ENCOUNTER — Other Ambulatory Visit (HOSPITAL_COMMUNITY): Payer: Self-pay

## 2023-05-03 ENCOUNTER — Other Ambulatory Visit: Payer: Self-pay | Admitting: Family Medicine

## 2023-05-05 ENCOUNTER — Other Ambulatory Visit (HOSPITAL_COMMUNITY): Payer: Self-pay

## 2023-05-05 ENCOUNTER — Other Ambulatory Visit: Payer: Self-pay

## 2023-05-08 ENCOUNTER — Other Ambulatory Visit: Payer: Self-pay

## 2023-05-09 ENCOUNTER — Other Ambulatory Visit (HOSPITAL_COMMUNITY): Payer: Self-pay

## 2023-05-09 ENCOUNTER — Telehealth: Payer: Self-pay

## 2023-05-09 NOTE — Telephone Encounter (Signed)
 Prolia VOB initiated via [email or web address]  Next Prolia inj DUE: 05/17/23

## 2023-05-12 NOTE — Telephone Encounter (Signed)
 Amber [name]

## 2023-05-12 NOTE — Telephone Encounter (Signed)
 Pharmacy Patient Advocate Encounter   Received notification from  [facility] Portal that prior authorization for PROLIA is required/requested.   Insurance verification completed.   The patient is insured through [employer] .   Per test claim: PA required; PA submitted to above mentioned insurance via Availity Key/confirmation #/EOC  [id number] Status is pending

## 2023-05-15 NOTE — Telephone Encounter (Signed)
 Pharmacy Patient Advocate Encounter  Received notification from [employer] that Prior Authorization for PROLIA has been APPROVED from 05/12/23 to 05/11/24   PA #/Case ID/Reference #: [id number]

## 2023-05-19 ENCOUNTER — Telehealth: Payer: Self-pay | Admitting: Family Medicine

## 2023-05-19 NOTE — Telephone Encounter (Signed)
 Copied from CRM [id number]. Topic: Appointments - Scheduling Inquiry for Clinic >> May 19, 2023 11:00 AM Amber [name] wrote: Reason for CRM: Patient would like to schedule her Prolia injection.

## 2023-05-19 NOTE — Telephone Encounter (Signed)
 Message sent to have auth checked

## 2023-05-23 ENCOUNTER — Other Ambulatory Visit (HOSPITAL_COMMUNITY): Payer: Self-pay

## 2023-05-23 NOTE — Telephone Encounter (Signed)
 Pt ready for scheduling for PROLIA on or after : 05/23/23  Option# 1: Buy/Bill (Office supplied medication)  Out-of-pocket cost due at time of clinic visit: $357  Number of injection/visits approved: 2  Primary: [employer]-MEDICARE Prolia co-insurance: 20% Admin fee co-insurance: 20%  Secondary: --- Prolia co-insurance:  Admin fee co-insurance:   Medical Benefit Details: Date Benefits were checked: 05/09/23 Deductible: NO/ Coinsurance: 20%/ Admin Fee: 20%  Prior Auth: APPROVED PA# [id number] Expiration Date: 05/12/23-05/11/24   # of doses approved: 2 ----------------------------------------------------------------------- Option# 2- Med Obtained from pharmacy:  Pharmacy benefit: Copay $645.79 (Paid to pharmacy) Admin Fee: $25 (Pay at clinic)  Prior Auth: N/A PA# Expiration Date:   # of doses approved:   If patient wants fill through the pharmacy benefit please send prescription to: [employer], and include estimated need by date in rx notes. Pharmacy will ship medication directly to the office.  Patient NOT eligible for Prolia Copay Card. Copay Card can make patient's cost as little as $25. Link to apply: [email or web address]://[email or web address].[email or web address]/copay  ** This summary of benefits is an estimation of the patient's out-of-pocket cost. Exact cost may very based on individual plan coverage.

## 2023-05-29 NOTE — Telephone Encounter (Signed)
 Called patient reviewed all following information including appointment, Co pay due at time of visit and if pick up of injection is needed from outside pharmacy.     Out of pocket for patient:  $357  Lab appointment :  06/08/23 at CPE  Nurse visit: 06/14/23  Lab order placed: No - will be placed at CPE with Nance Pear has been  []   Ordered  [x]   Office supply medication will be used  []   Script sent to local pharmacy for patient to bring   []   Script sent to Specialty pharmacy   []   Script sent to [employer] to deliver

## 2023-06-02 ENCOUNTER — Other Ambulatory Visit: Payer: Self-pay

## 2023-06-02 NOTE — Progress Notes (Signed)
 Disenrolled - patient receiving prolia through office. Medication will be supplied via buy-bill per 3.25.25 chart note for patient affordability.

## 2023-06-08 ENCOUNTER — Encounter: Payer: Self-pay | Admitting: Family Medicine

## 2023-06-08 ENCOUNTER — Ambulatory Visit (INDEPENDENT_AMBULATORY_CARE_PROVIDER_SITE_OTHER): Payer: Medicare HMO | Admitting: Family Medicine

## 2023-06-08 VITALS — BP 132/74 | HR 75 | Temp 98.5°F | Ht 62.6 in | Wt 127.2 lb

## 2023-06-08 DIAGNOSIS — M8580 Other specified disorders of bone density and structure, unspecified site: Secondary | ICD-10-CM | POA: Diagnosis not present

## 2023-06-08 DIAGNOSIS — E7800 Pure hypercholesterolemia, unspecified: Secondary | ICD-10-CM

## 2023-06-08 DIAGNOSIS — Z170 Estrogen receptor positive status [ER+]: Secondary | ICD-10-CM

## 2023-06-08 DIAGNOSIS — I159 Secondary hypertension, unspecified: Secondary | ICD-10-CM

## 2023-06-08 DIAGNOSIS — I10 Essential (primary) hypertension: Secondary | ICD-10-CM | POA: Diagnosis not present

## 2023-06-08 DIAGNOSIS — E785 Hyperlipidemia, unspecified: Secondary | ICD-10-CM

## 2023-06-08 DIAGNOSIS — M810 Age-related osteoporosis without current pathological fracture: Secondary | ICD-10-CM

## 2023-06-08 DIAGNOSIS — Z0000 Encounter for general adult medical examination without abnormal findings: Secondary | ICD-10-CM

## 2023-06-08 DIAGNOSIS — Z7189 Other specified counseling: Secondary | ICD-10-CM

## 2023-06-08 NOTE — Patient Instructions (Addendum)
 Tetanus shot at the pharmacy.   I would get a flu shot each fall.   If your BP is low (below 120/70) or if you are lightheaded, then you could cut amlodipine in half.   Take care.  Glad to see you. Update me as needed.

## 2023-06-08 NOTE — Progress Notes (Signed)
 Hypertension:    Using medication without problems or lightheadedness: yes Chest pain with exertion:no Edema:no Short of breath:no  Elevated Cholesterol: Using medications without problems:yes Muscle aches: not likely from statin, d/w pt.   Diet compliance: d/w pt.  Exercise: d/w pt.   She has hematology f/u pending.  Still on letrozole.    Still on prolia- the price increased for patient in the meantime.  Labs pending.  I am asking for pharmacy help on this.  No adverse effect on medication.  Flu d/w pt.  Shingles 07/09/2020 PNA up-to-date Tetanus 07-10-2010, discussed with patient COVID-vaccine previously done RSV d/w pt. she reported having this done at pharmacy on [address]  Colon cancer screening not due given her age. Breast cancer screening Jul 10, 2022 Bone density test 2022/07/10 Advance directive- great grandson Irvin Mantel designated if patient were incapacitated.   D/w pt about her son and grand daughter, both had passed away in 07-09-21.    Meds, vitals, and allergies reviewed.   PMH and SH reviewed  ROS: Per HPI unless specifically indicated in ROS section   GEN: nad, alert and oriented HEENT: ncat NECK: supple w/o LA CV: rrr. PULM: ctab, no inc wob ABD: soft, +bs EXT: no edema SKIN: no acute rash  See notes on labs.

## 2023-06-09 LAB — CBC WITH DIFFERENTIAL/PLATELET
Basophils Absolute: 0 10*3/uL (ref 0.0–0.1)
Basophils Relative: 0.4 % (ref 0.0–3.0)
Eosinophils Absolute: 0.3 10*3/uL (ref 0.0–0.7)
Eosinophils Relative: 2.9 % (ref 0.0–5.0)
HCT: 40.7 % (ref 36.0–46.0)
Hemoglobin: 13.9 g/dL (ref 12.0–15.0)
Lymphocytes Relative: 32.9 % (ref 12.0–46.0)
Lymphs Abs: 2.9 10*3/uL (ref 0.7–4.0)
MCHC: 34 g/dL (ref 30.0–36.0)
MCV: 92.1 fl (ref 78.0–100.0)
Monocytes Absolute: 0.7 10*3/uL (ref 0.1–1.0)
Monocytes Relative: 7.6 % (ref 3.0–12.0)
Neutro Abs: 5 10*3/uL (ref 1.4–7.7)
Neutrophils Relative %: 56.2 % (ref 43.0–77.0)
Platelets: 270 10*3/uL (ref 150.0–400.0)
RBC: 4.42 Mil/uL (ref 3.87–5.11)
RDW: 13.1 % (ref 11.5–15.5)
WBC: 8.8 10*3/uL (ref 4.0–10.5)

## 2023-06-09 LAB — COMPREHENSIVE METABOLIC PANEL WITH GFR
ALT: 9 U/L (ref 0–35)
AST: 15 U/L (ref 0–37)
Albumin: 4.3 g/dL (ref 3.5–5.2)
Alkaline Phosphatase: 61 U/L (ref 39–117)
BUN: 17 mg/dL (ref 6–23)
CO2: 31 meq/L (ref 19–32)
Calcium: 9.5 mg/dL (ref 8.4–10.5)
Chloride: 101 meq/L (ref 96–112)
Creatinine, Ser: 0.9 mg/dL (ref 0.40–1.20)
GFR: 57.5 mL/min — ABNORMAL LOW (ref >60.00)
Glucose, Bld: 86 mg/dL (ref 70–99)
Potassium: 3.7 meq/L (ref 3.5–5.1)
Sodium: 142 meq/L (ref 135–145)
Total Bilirubin: 0.5 mg/dL (ref 0.2–1.2)
Total Protein: 6.7 g/dL (ref 6.0–8.3)

## 2023-06-09 LAB — LIPID PANEL
Cholesterol: 221 mg/dL — ABNORMAL HIGH (ref 0–200)
HDL: 54.5 mg/dL (ref >39.00)
LDL Cholesterol: 134 mg/dL — ABNORMAL HIGH (ref 0–99)
NonHDL: 166.13
Total CHOL/HDL Ratio: 4
Triglycerides: 161 mg/dL — ABNORMAL HIGH (ref 0.0–149.0)
VLDL: 32.2 mg/dL (ref 0.0–40.0)

## 2023-06-09 LAB — TSH: TSH: 2.34 u[IU]/mL (ref 0.35–5.50)

## 2023-06-09 LAB — VITAMIN D 25 HYDROXY (VIT D DEFICIENCY, FRACTURES): VITD: 61.86 ng/mL (ref 30.00–100.00)

## 2023-06-11 DIAGNOSIS — Z0000 Encounter for general adult medical examination without abnormal findings: Secondary | ICD-10-CM | POA: Insufficient documentation

## 2023-06-11 NOTE — Assessment & Plan Note (Signed)
 Continue amlodipine

## 2023-06-11 NOTE — Assessment & Plan Note (Signed)
 Advance directive- great grandson Irvin Mantel designated if patient were incapacitated.

## 2023-06-11 NOTE — Assessment & Plan Note (Signed)
 Flu d/w pt.  Shingles 2022 PNA up-to-date Tetanus 2012, discussed with patient COVID-vaccine previously done RSV d/w pt. she reported having this done at pharmacy on [address]  Colon cancer screening not due given her age. Breast cancer screening 2024 Bone density test 2024 Advance directive- great grandson Amber [name] designated if patient were incapacitated.

## 2023-06-11 NOTE — Assessment & Plan Note (Signed)
Continue Crestor twice weekly.

## 2023-06-11 NOTE — Assessment & Plan Note (Addendum)
 History of, on letrozole per outside clinic.

## 2023-06-11 NOTE — Assessment & Plan Note (Signed)
 Still on prolia- the price increased for patient in the meantime.  Labs pending.  I am asking for pharmacy help on this.  No adverse effect on medication.

## 2023-06-14 ENCOUNTER — Ambulatory Visit

## 2023-06-14 DIAGNOSIS — M8580 Other specified disorders of bone density and structure, unspecified site: Secondary | ICD-10-CM | POA: Diagnosis not present

## 2023-06-14 MED ORDER — DENOSUMAB 60 MG/ML ~~LOC~~ SOSY: 60.0000 mg | PREFILLED_SYRINGE | Freq: Once | SUBCUTANEOUS | Status: DC

## 2023-06-14 MED ORDER — DENOSUMAB 60 MG/ML ~~LOC~~ SOSY
60.0000 mg | PREFILLED_SYRINGE | Freq: Once | SUBCUTANEOUS | Status: AC
  Administered 2023-06-14: 60 mg via SUBCUTANEOUS

## 2023-06-14 NOTE — Progress Notes (Signed)
 Per orders of Dr. Richrd Char, who is out off office and Dr Claire Crick who is in office injection of prolia 60 mg [place] given by [name] in left arm.. Patient tolerated injection well. Patient will make appointment for 6 month.

## 2023-06-15 ENCOUNTER — Telehealth: Payer: Self-pay | Admitting: *Deleted

## 2023-06-15 NOTE — Progress Notes (Signed)
 Care Guide Pharmacy Note  06/15/2023 Name: Amber [name] MRN: [id number] DOB: 06/25/1933  Referred By: Donnie Galea, MD Reason for referral: Complex Care Management and Call Attempt #1 (Outreach to schedule referral with pharmacist )   Amber [name] is a 89 y.o. year old female who is a primary care patient of Donnie Galea, MD.  Amber [name] was referred to the pharmacist for assistance related to:  Prolia  An unsuccessful telephone outreach was attempted today to contact the patient who was referred to the pharmacy team for assistance with medication assistance. Additional attempts will be made to contact the patient.  Amber [name], CMA [place]  [facility], [facility] Guide Direct Dial: [phone]  Fax: [phone] Website: [email or web address]

## 2023-06-15 NOTE — Progress Notes (Signed)
 Care Guide Pharmacy Note  06/15/2023 Name: [name] MRN: [id number] DOB: 09-29-1933  Referred By: Donnie Galea, MD Reason for referral: Complex Care Management and Call Attempt #1 (Outreach to schedule referral with pharmacist )   Amber [name] is a 89 y.o. year old female who is a primary care patient of Donnie Galea, MD.  [name] was referred to the pharmacist for assistance related to:  Prolia  Successful contact was made with the patient to discuss pharmacy services including being ready for the pharmacist to call at least 5 minutes before the scheduled appointment time and to have medication bottles and any blood pressure readings ready for review. The patient agreed to meet with the pharmacist via telephone visit on 06/21/2023  Kandis Ormond, CMA   [facility], [facility] Guide Direct Dial: [phone]  Fax: [phone] Website: [email or web address]

## 2023-06-19 ENCOUNTER — Inpatient Hospital Stay: Payer: Medicare HMO | Attending: Hematology and Oncology | Admitting: Hematology and Oncology

## 2023-06-19 VITALS — BP 145/78 | HR 98 | Temp 98.6°F | Ht 62.6 in | Wt 128.1 lb

## 2023-06-19 DIAGNOSIS — Z780 Asymptomatic menopausal state: Secondary | ICD-10-CM

## 2023-06-19 DIAGNOSIS — Z9221 Personal history of antineoplastic chemotherapy: Secondary | ICD-10-CM | POA: Insufficient documentation

## 2023-06-19 DIAGNOSIS — M810 Age-related osteoporosis without current pathological fracture: Secondary | ICD-10-CM | POA: Insufficient documentation

## 2023-06-19 DIAGNOSIS — Z1721 Progesterone receptor positive status: Secondary | ICD-10-CM | POA: Insufficient documentation

## 2023-06-19 DIAGNOSIS — Z170 Estrogen receptor positive status [ER+]: Secondary | ICD-10-CM

## 2023-06-19 DIAGNOSIS — C50211 Malignant neoplasm of upper-inner quadrant of right female breast: Secondary | ICD-10-CM | POA: Insufficient documentation

## 2023-06-19 DIAGNOSIS — R197 Diarrhea, unspecified: Secondary | ICD-10-CM | POA: Diagnosis not present

## 2023-06-19 DIAGNOSIS — Z9011 Acquired absence of right breast and nipple: Secondary | ICD-10-CM | POA: Insufficient documentation

## 2023-06-19 DIAGNOSIS — Z79811 Long term (current) use of aromatase inhibitors: Secondary | ICD-10-CM | POA: Insufficient documentation

## 2023-06-19 NOTE — Assessment & Plan Note (Signed)
 03/09/2019: Right mastectomy Amber [name]): IDC, 2.6cm, grade 2, clear margins, 6 lymph nodes negative for carcinoma.,  ER 100%, PR 90%, HER-2 equivocal by IHC, FISH positive ratio 2.76, copy #6.2, Ki-67 15%   Treatment plan: 1. Adjuvant Herceptin  every 3 weeks. Subcutaneously starting 04/16/2019 completed 02/25/2020 2. Adjuvant antiestrogen therapy with anastrozole  1 mg daily starting 04/16/2019, switched to letrozole  03/18/2021 due to diarrhea   Letrozole  toxicities:     Breast cancer surveillance: 1.  Left breast mammogram: 03/01/2022: No evidence of malignancy at [facility], breast density category B 2. bone density: 03/15/2022: T score -1.6: Osteopenia: Recommend calcium  and vitamin D  3.  Breast exam 06/19/2023: Benign   Return to clinic in 1 year for follow-up

## 2023-06-19 NOTE — Progress Notes (Signed)
 Patient Care Team: Donnie Galea, MD as PCP - General (Family Medicine) Amedeo Jupiter, MD as Consulting Physician (Ophthalmology) Jolinda Necessary, MD (Inactive) as Consulting Physician (Gastroenterology) Ronell Coe, DDS as Referring Physician (Dentistry) Auther Bo, RN as Oncology Nurse Navigator Alane Hsu, RN as Oncology Nurse Navigator  DIAGNOSIS:  Encounter Diagnosis  Name Primary?   Malignant neoplasm of upper-inner quadrant of right breast in female, estrogen receptor positive (HCC) Yes    SUMMARY OF ONCOLOGIC HISTORY: Oncology History  Malignant neoplasm of upper-inner quadrant of right breast in female, estrogen receptor positive (HCC)  02/13/2019 Initial Diagnosis   Routine screening mammogram detected 1.5cm right breast mass, 2.3cm on diagnostic mammogram, at the 1 o'clock position. Biopsy showed IDC, grade 2, HER-2 + by FISH, ER+ 100%, PR+ 90%, Ki67 15%.    03/19/2019 Surgery   Right mastectomy Delane Fear): IDC, 2.6cm, grade 2, clear margins, 6 lymph nodes negative for carcinoma.    03/28/2019 -  Anti-estrogen oral therapy   Anastrozole     04/16/2019 - 02/25/2020 Chemotherapy   Patient is on Treatment Plan : BREAST Trastuzumab  q21d      Genetic Testing   Negative genetic testing. No pathogenic variants identified on the [facility] STAT Breast cancer panel + Common Hereditary Cancers Panel. VUS in BRCA2 called c.5378A>G and a VUS in MUTYH called c.925C>T identified. The report date is 02/28/2019.   The STAT Breast cancer panel offered by [facility] includes sequencing and rearrangement analysis for the following 9 genes:  ATM, BRCA1, BRCA2, CDH1, CHEK2, PALB2, PTEN, STK11 and TP53.    The Common Hereditary Cancers Panel offered by [facility] includes sequencing and/or deletion duplication testing of the following 48 genes: APC, ATM, AXIN2, BARD1, BMPR1A, BRCA1, BRCA2, BRIP1, CDH1, CDKN2A (p14ARF), CDKN2A (p16INK4a), CKD4, CHEK2, CTNNA1, DICER1, EPCAM  (Deletion/duplication testing only), GREM1 (promoter region deletion/duplication testing only), KIT, MEN1, MLH1, MSH2, MSH3, MSH6, MUTYH, NBN, NF1, NHTL1, PALB2, PDGFRA, PMS2, POLD1, POLE, PTEN, RAD50, RAD51C, RAD51D, RNF43, SDHB, SDHC, SDHD, SMAD4, SMARCA4. STK11, TP53, TSC1, TSC2, and VHL.  The following genes were evaluated for sequence changes only: SDHA and HOXB13 c.251G>A variant only.     CHIEF COMPLIANT: Surveillance of breast cancer on antiestrogen therapy  HISTORY OF PRESENT ILLNESS:  History of Present Illness The patient, with a history of breast cancer, is currently on letrozole  therapy. She reports that letrozole  is better tolerated than the previous medication. However, she experiences diarrhea, which is somewhat controlled by Crestor , a cholesterol medication. She does not take Crestor  daily due to a cough side effect. The patient also reports that the frequency of bowel movements increases if she does not take Crestor .  The patient is also on Prolia  for bone health, which she received last week. However, she expresses concern about the cost of the medication, which has increased to $357 per shot. She is considering discontinuing Prolia  due to the cost.  The patient has not yet had her mammogram this year and needs to schedule it. She also has a bone density test scheduled for January of next year.     ALLERGIES:  is allergic to ace inhibitors, escitalopram oxalate, lipitor [atorvastatin calcium ], zetia [ezetimibe], and zocor [simvastatin - high dose].  MEDICATIONS:  Current Outpatient Medications  Medication Sig Dispense Refill   amLODipine  (NORVASC ) 5 MG tablet TAKE 1 TABLET (5 MG TOTAL) BY MOUTH DAILY. 90 tablet 1   aspirin 325 MG tablet Take 325 mg by mouth daily.     calcium  carbonate (CALCIUM  600) 600 MG  TABS tablet Take 1 tablet (600 mg total) by mouth daily with breakfast.     Cholecalciferol  1000 units tablet Take 1 tablet (1,000 Units total) by mouth daily.      letrozole  (FEMARA ) 2.5 MG tablet TAKE 1 TABLET BY MOUTH EVERY DAY 90 tablet 3   Multiple Vitamin (MULTIVITAMIN) tablet Take 1 tablet by mouth daily.     omega-3 acid ethyl esters (LOVAZA ) 1 g capsule Take 1 capsule (1 g total) by mouth daily. 90 capsule 3   PROLIA  60 MG/ML SOSY injection Inject into the skin.     rosuvastatin  (CRESTOR ) 5 MG tablet TAKE 1 TABLET BY MOUTH TWICE WEEKLY 24 tablet 1   saccharomyces boulardii (FLORASTOR) 250 MG capsule Take 1 capsule (250 mg total) by mouth daily. 60 capsule 3   Current Facility-Administered Medications  Medication Dose Route Frequency Provider Last Rate Last Admin   denosumab  (PROLIA ) injection 60 mg  60 mg Subcutaneous Once Duncan, Graham S, MD       [START ON 12/14/2023] denosumab  (PROLIA ) injection 60 mg  60 mg Subcutaneous Once Claire Crick, MD        PHYSICAL EXAMINATION: ECOG PERFORMANCE STATUS: 1 - Symptomatic but completely ambulatory  Vitals:   06/19/23 1354  BP: (!) 145/78  Pulse: 98  Temp: 98.6 F (37 C)  SpO2: 97%   Filed Weights   06/19/23 1354  Weight: 128 lb 1.6 oz (58.1 kg)    Physical Exam No palpable lumps or nodules in the left breast or axilla.  No lumps in the right chest wall or axilla  (exam performed in the presence of a chaperone)  LABORATORY DATA:  I have reviewed the data as listed    Latest Ref Rng & Units 06/08/2023    1:46 PM 11/04/2022    9:48 AM 03/31/2022    3:16 PM  CMP  Glucose 70 - 99 mg/dL 86  366  440   BUN 6 - 23 mg/dL 17  14  15    Creatinine 0.40 - 1.20 mg/dL 3.47  4.25  9.56   Sodium 135 - 145 mEq/L 142  139  141   Potassium 3.5 - 5.1 mEq/L 3.7  3.9  3.7   Chloride 96 - 112 mEq/L 101  104  103   CO2 19 - 32 mEq/L 31  29  28    Calcium  8.4 - 10.5 mg/dL 9.5  9.1  9.3   Total Protein 6.0 - 8.3 g/dL 6.7     Total Bilirubin 0.2 - 1.2 mg/dL 0.5     Alkaline Phos 39 - 117 U/L 61     AST 0 - 37 U/L 15     ALT 0 - 35 U/L 9       Lab Results  Component Value Date   WBC 8.8 06/08/2023    HGB 13.9 06/08/2023   HCT 40.7 06/08/2023   MCV 92.1 06/08/2023   PLT 270.0 06/08/2023   NEUTROABS 5.0 06/08/2023    ASSESSMENT & PLAN:  Malignant neoplasm of upper-inner quadrant of right breast in female, estrogen receptor positive (HCC) 03/09/2019: Right mastectomy Delane Fear): IDC, 2.6cm, grade 2, clear margins, 6 lymph nodes negative for carcinoma.,  ER 100%, PR 90%, HER-2 equivocal by IHC, FISH positive ratio 2.76, copy #6.2, Ki-67 15%   Treatment plan: 1. Adjuvant Herceptin  every 3 weeks. Subcutaneously starting 04/16/2019 completed 02/25/2020 2. Adjuvant antiestrogen therapy with anastrozole  1 mg daily starting 04/16/2019, switched to letrozole  03/18/2021 due to diarrhea   Letrozole  toxicities: Tolerating  it extremely well without any hot flashes or arthralgias or myalgias.     Breast cancer surveillance: 1.  Left breast mammogram: 03/01/2022: No evidence of malignancy at [facility], breast density category B 2. bone density: 03/15/2022: T score -1.6: Osteopenia: Recommend calcium  and vitamin D .  Patient takes Prolia  injections with her primary care physician. 3.  Breast exam 06/19/2023: Benign   Return to clinic in 1 year for follow-up ------------------------------------- Assessment and Plan Assessment & Plan Malignant neoplasm of upper-inner quadrant of right breast Estrogen receptor-positive breast cancer, managed with letrozole . She tolerates letrozole  well with occasional diarrhea. - Continue letrozole  for one more year. - Ensure sufficient refills of letrozole  until the end of the year. - Order mammogram and instruct her to schedule [facility].  Osteoporosis Osteoporosis managed with Prolia . Significant cost increase noted, considering discontinuation due to expense. Bone density borderline, no fractures or falls. - Schedule bone density scan for January of next year. - Discuss with her the option to discontinue Prolia  due to cost and borderline bone density.      No  orders of the defined types were placed in this encounter.  The patient has a good understanding of the overall plan. she agrees with it. she will call with any problems that may develop before the next visit here. Total time spent: 30 mins including face to face time and time spent for planning, charting and co-ordination of care   Viinay K Aleana Fifita, MD 06/19/23

## 2023-06-21 ENCOUNTER — Other Ambulatory Visit (INDEPENDENT_AMBULATORY_CARE_PROVIDER_SITE_OTHER): Admitting: Pharmacist

## 2023-06-21 DIAGNOSIS — M8580 Other specified disorders of bone density and structure, unspecified site: Secondary | ICD-10-CM

## 2023-06-21 NOTE — Progress Notes (Signed)
    06/21/2023 Name: KARLIN BINION MRN: [id number] DOB: 24-Sep-1933  Subjective  Chief Complaint  Patient presents with   Osteopenia   Medication Access    Care Team: Primary Care Provider: Donnie Galea, MD  Reason for visit: ?  NEZIAH VOGELGESANG is a 89 y.o. female who presents today for a telephone visit with the pharmacist due to medication access concerns regarding their Prolia  injection. ?   Medication Access: ?  Reports that all medications are not affordable.  Has been receiving Prolia  for $100. Established on therapy and doing well though notes her copay has increased as of late.  Notes her copay is now $357 per dose ($714 yearly)  Prescription drug coverage: YES Payor: AETNA MEDICARE / Plan: AETNA MEDICARE HMO/PPO / Product Type: *No Product type* / .  Plan ID: [id number] (PPO Medicare Adv with PDP)  Summary of Benefit: [email or web address] Medicare B Medications (Prolia ): 0-20% coinsurance.  Cash price Prolia  = $1845 20% co-insurance would be $369 Medicare D Medications: $0 deductible. $0-5 generics. 25-35% coinsurance on brand medication  Current Patient Assistance:  None  Patient lives in a household of 1 with an estimated combined monthly income of 1600 + 2000 = 3600/month  via Product manager and retirement plan. Current income ~274% FPL  Medicare LIS Eligible: No (exceeds income threshold)  Assessment and Plan:   1. Medication Access Spoke with [employer] regarding Prolia  PAP. They report this is not offered to any Medicare patients.  No grants open at this time specific to osteoporosis   2. Osteoprosis: Last DEXA Jan 2024 with osteopenic T-score in femoral neck. Elevated FRAX score >3% Hip indicated patient for treatment. Has been taking Prolia  without issues since. Prolia  remains ideal though cost is a barrier. Prolia  discontinuation not recommended due to  accelerated bone turnover upon discontinuation.   Possible alternatives  Oral Bisphosphonate:  Oral bisphosphonate reasonable (eGFR consistently >30). No documented history of esophageal stricture or swallowing concerns. Data remains uncertain regarding optimal timing of bisphosphonate s/p long-term Prolia  use though patient has only been taking ~14 months (Prolia  inhibits bone turnover whereas bisphosphonate work only at sites of bone turnover).   IV bisphosphonate, Reclast ideal for adherence, worry cost may be barrier. Reasonable to undergo benefit investigation to determine cost via [facility] team.  Future Appointments  Date Time Provider Department Center  08/11/2023  1:40 PM [facility] ANNUAL WELLNESS VISIT 1 [facility]  06/19/2024 11:45 AM Cameron Cea, MD [facility] None   Daron Ellen, PharmD Clinical Pharmacist [facility] [phone]

## 2023-06-27 ENCOUNTER — Encounter: Payer: Self-pay | Admitting: Pharmacist

## 2023-06-27 NOTE — Progress Notes (Unsigned)
 Brief Telephone Documentation Reason for Call: Called patient to discuss her preferences on transition to bisphosphonate (oral or IV) given Prolia  cost too great.   Summary of Call: Called patient 4/29. No answer. Will try her again this week.     Follow Up: Benefit investigation for Reclast - request sent to [place]/med assistance pool  Patient given direct line for further questions/concerns.  Daron Ellen, PharmD Clinical Pharmacist [facility] [phone]

## 2023-06-28 ENCOUNTER — Telehealth: Payer: Self-pay

## 2023-06-28 NOTE — Telephone Encounter (Signed)
 Findings of benefits investigation for Reclast- [id number]:   Insurance: AETNA   Phone: [phone]   Rep# [id number]  Ref# [id number]   Medicare Advantage: Plan follows Medicare guidelines and covers 80% of the infusion and no authorization is required for J3489 as long as prescribed with FDA approved indication. [facility] is in Network. Patient does not have Medicare Supplement Plan, so patient will be required to pay the 20% coinsurance per each infusion which will be applied to her Max Out of Pocket- $4150. Patient has met $312.46 as of 06/28/23.

## 2023-08-10 ENCOUNTER — Other Ambulatory Visit: Payer: Self-pay | Admitting: Pharmacist

## 2023-08-10 DIAGNOSIS — M8580 Other specified disorders of bone density and structure, unspecified site: Secondary | ICD-10-CM

## 2023-08-10 NOTE — Progress Notes (Signed)
 Brief Telephone Documentation Reason for Call: Re: Prolia  alternatives  Summary of Call: Called patient 6/12. No answer, left voicemail with direct callback number.  6/13: Reached patient to discuss OP treatment  Patient confirms again that the ~$300 forProlia  does not interest her. She is open to cheaper alternatives.   We discussed oral bisphosphonates in detail today including administration instructions and possible adverse effects.    Assessment/Plan: Osteopenia per last DEXA with FRAX score indicating treatment (Total 13%, Hip 3.7%). PO bisphosphonate clinically appropriate. Prolia  no longer affordable.   Recent labs without concerns. Calcium  consistently WNL at 9.5 (albumin normal, corrected calcium  9.7). Vitamin D  last checked 05/2023 WNL, 61.86. Renal function consistently >>30.   Alendronate oral tablet 70 mg once weekly Take 30 minutes before food/drink/meds Take with 6-8 oz of plain water, remain upright for 30 minutes Separate 2 hours from calcium , antacids, iron, magnesium Caution with NSAID (GI irritation)  Last Prolia  injection: 06/14/23 Alendronate initiation ~6 months after last Prolia  injection = October   Phone Visit scheduled October to ensure medication is started/labs remain appropriate to initiate   Follow Up: Patient given direct line for further questions/concerns.  Future Appointments  Date Time Provider Department Center  08/11/2023  1:40 PM [facility] ANNUAL WELLNESS VISIT 1 [facility]  12/06/2023 10:30 AM [facility] PHARMACIST [facility]  06/19/2024 11:45 AM Cameron Cea, MD [facility] None    Daron Ellen, PharmD Clinical Pharmacist [facility] [phone]

## 2023-08-11 ENCOUNTER — Ambulatory Visit (INDEPENDENT_AMBULATORY_CARE_PROVIDER_SITE_OTHER)

## 2023-08-11 VITALS — Ht 62.6 in | Wt 128.0 lb

## 2023-08-11 DIAGNOSIS — Z0000 Encounter for general adult medical examination without abnormal findings: Secondary | ICD-10-CM | POA: Diagnosis not present

## 2023-08-11 NOTE — Patient Instructions (Signed)
 Amber [name] , Thank you for taking time out of your busy schedule to complete your Annual Wellness Visit with me. I enjoyed our conversation and look forward to speaking with you again next year. I, as well as your care team,  appreciate your ongoing commitment to your health goals. Please review the following plan we discussed and let me know if I can assist you in the future. Your Game plan/ To Do List     Follow up Visits: Next Medicare AWV with our clinical staff: 6/17/(26 @ 1:40pm televisit   Have you seen your provider in the last 6 months (3 months if uncontrolled diabetes)? Yes Next Office Visit with your provider: pt will schedule in dec for April CPE  Clinician Recommendations:  Aim for 30 minutes of exercise or brisk walking, 6-8 glasses of water, and 5 servings of fruits and vegetables each day.       This is a list of the screening recommended for you and due dates:  Health Maintenance  Topic Date Due   DTaP/Tdap/Td vaccine (2 - Tdap) 06/21/2020   COVID-19 Vaccine (7 - 2024-25 season) 10/30/2022   Colon Cancer Screening  02/07/2023   Mammogram  03/02/2023   Flu Shot  09/29/2023   Medicare Annual Wellness Visit  08/10/2024   Pneumococcal Vaccine for age over 31  Completed   DEXA scan (bone density measurement)  Completed   Zoster (Shingles) Vaccine  Completed   HPV Vaccine  Aged Out   Meningitis B Vaccine  Aged Out    Advanced directives: (Copy Requested) Please bring a copy of your health care power of attorney and living will to the office to be added to your chart at your convenience. You can mail to [facility] [address] [place], [place] [address] or email to [email or web address] Advance Care Planning is important because it:  [x]  Makes sure you receive the medical care that is consistent with your values, goals, and preferences  [x]  It provides guidance to your family and loved ones and reduces their decisional burden about whether or not  they are making the right decisions based on your wishes.  Follow the link provided in your after visit summary or read over the paperwork we have mailed to you to help you started getting your Advance Directives in place. If you need assistance in completing these, please reach out to us  so that we can help you!

## 2023-08-11 NOTE — Progress Notes (Signed)
 Subjective:   Amber [name] is a 89 y.o. who presents for a Medicare Wellness preventive visit.  As a reminder, Annual Wellness Visits don't include a physical exam, and some assessments may be limited, especially if this visit is performed virtually. We may recommend an in-person follow-up visit with your provider if needed.  Visit Complete: Virtual I connected with  Amber [name] on 08/11/23 by a audio enabled telemedicine application and verified that I am speaking with the correct person using two identifiers.  Patient Location: Home  Provider Location: Office/Clinic  I discussed the limitations of evaluation and management by telemedicine. The patient expressed understanding and agreed to proceed.  Vital Signs: Because this visit was a virtual/telehealth visit, some criteria may be missing or patient reported. Any vitals not documented were not able to be obtained and vitals that have been documented are patient reported.  VideoDeclined- This patient declined Librarian, academic. Therefore the visit was completed with audio only.  Persons Participating in Visit: Patient.  AWV Questionnaire: No: Patient Medicare AWV questionnaire was not completed prior to this visit.  Cardiac Risk Factors include: advanced age (>54men, >74 women);dyslipidemia;hypertension;sedentary lifestyle     Objective:    Today's Vitals   08/11/23 1347  Weight: 128 lb (58.1 kg)  Height: 5' 2.6 (1.59 m)   Body mass index is 22.96 kg/m.     08/11/2023    1:57 PM 06/07/2022    1:49 PM 02/25/2020    2:27 PM 01/14/2020    1:58 PM 12/24/2019    1:41 PM 10/22/2019    2:09 PM 09/09/2019    2:27 PM  Advanced Directives  Does Patient Have a Medical Advance Directive? No No Yes Yes Yes Yes Yes  Type of Surveyor, minerals;Living will Healthcare Power of [place];Living will Healthcare Power of [employer] Power of [employer] Power  of Attorney  Does patient want to make changes to medical advance directive?   No - Patient declined No - Patient declined No - Patient declined    Copy of Healthcare Power of Attorney in Chart?    No - copy requested No - copy requested No - copy requested No - copy requested  Would patient like information on creating a medical advance directive?  No - Patient declined     No - Patient declined    Current Medications (verified) Outpatient Encounter Medications as of 08/11/2023  Medication Sig   amLODipine  (NORVASC ) 5 MG tablet TAKE 1 TABLET (5 MG TOTAL) BY MOUTH DAILY.   aspirin 325 MG tablet Take 325 mg by mouth daily.   calcium  carbonate (CALCIUM  600) 600 MG TABS tablet Take 1 tablet (600 mg total) by mouth daily with breakfast.   Cholecalciferol  1000 units tablet Take 1 tablet (1,000 Units total) by mouth daily.   letrozole  (FEMARA ) 2.5 MG tablet TAKE 1 TABLET BY MOUTH EVERY DAY   Multiple Vitamin (MULTIVITAMIN) tablet Take 1 tablet by mouth daily.   omega-3 acid ethyl esters (LOVAZA ) 1 g capsule Take 1 capsule (1 g total) by mouth daily.   PROLIA  60 MG/ML SOSY injection Inject into the skin.   rosuvastatin  (CRESTOR ) 5 MG tablet TAKE 1 TABLET BY MOUTH TWICE WEEKLY   saccharomyces boulardii (FLORASTOR) 250 MG capsule Take 1 capsule (250 mg total) by mouth daily.   Facility-Administered Encounter Medications as of 08/11/2023  Medication   denosumab  (PROLIA ) injection 60 mg   [START ON 12/14/2023] denosumab  (PROLIA ) injection  60 mg    Allergies (verified) Ace inhibitors, Escitalopram oxalate, Lipitor [atorvastatin calcium ], Zetia [ezetimibe], and Zocor [simvastatin - high dose]   History: Past Medical History:  Diagnosis Date   Anemia    Blood transfusion without reported diagnosis    during treatment for colon CA   Breast cancer (HCC) 2020   right breast IDC   C. difficile colitis    history of   Cancer (HCC)    Colon, s/p partial colectomy, no chemo or radiation    Diverticulitis    Family history of bladder cancer    Family history of breast cancer    Family history of colon cancer    Family history of throat cancer    Hyperlipidemia    Hypertension    Urinary incontinence    stress incont.   Urinary tract infection    Past Surgical History:  Procedure Laterality Date   ABDOMINAL HYSTERECTOMY     BLADDER SUSPENSION     CARDIOVASCULAR STRESS TEST  11/02/2004   EF 76%   CATARACT EXTRACTION     COLON SURGERY  2008-09-04   colon cancer   MASTECTOMY W/ SENTINEL NODE BIOPSY Right 03/19/2019   Procedure: RIGHT MASTECTOMY WITH RIGHT AXILLARY SENTINEL LYMPH NODE BIOPSY;  Surgeon: Enid Harry, MD;  Location: [facility];  Service: General;  Laterality: Right;   Family History  Problem Relation Age of Onset   Cancer Mother        breast cancer   Breast cancer Mother    Cancer Father        colon cancer   Stroke Father    Colon cancer Father    Cancer Brother        bladder cancer   Cancer Brother        throat cancer   Social History   Socioeconomic History   Marital status: Widowed    Spouse name: Not on file   Number of children: Not on file   Years of education: Not on file   Highest education level: Not on file  Occupational History   Not on file  Tobacco Use   Smoking status: Never   Smokeless tobacco: Never  Vaping Use   Vaping status: Never Used  Substance and Sexual Activity   Alcohol use: No   Drug use: No   Sexual activity: Not Currently    Birth control/protection: Surgical  Other Topics Concern   Not on file  Social History Narrative   Widowed, married 05-Sep-1954   Initially had 1 son, who died September 04, 2021.    Granddaughter died 09-04-2021 in MVA.     Great grandson lives locally.     Social Drivers of Corporate investment banker Strain: Low Risk  (08/11/2023)   Overall Financial Resource Strain (CARDIA)    Difficulty of Paying Living Expenses: Not hard at all  Food Insecurity: No Food Insecurity (08/11/2023)    Hunger Vital Sign    Worried About Running Out of Food in the Last Year: Never true    Ran Out of Food in the Last Year: Never true  Transportation Needs: No Transportation Needs (08/11/2023)   PRAPARE - Administrator, Civil Service (Medical): No    Lack of Transportation (Non-Medical): No  Physical Activity: Inactive (08/11/2023)   Exercise Vital Sign    Days of Exercise per Week: 0 days    Minutes of Exercise per Session: 0 min  Stress: No Stress Concern Present (08/11/2023)  [employer] of Occupational Health - Occupational Stress Questionnaire    Feeling of Stress: Not at all  Social Connections: Moderately Integrated (08/11/2023)   Social Connection and Isolation Panel    Frequency of Communication with Friends and Family: More than three times a week    Frequency of Social Gatherings with Friends and Family: More than three times a week    Attends Religious Services: More than 4 times per year    Active Member of [employer] or Organizations: Yes    Attends Banker Meetings: More than 4 times per year    Marital Status: Widowed    Tobacco Counseling Counseling given: Not Answered    Clinical Intake:  Pre-visit preparation completed: Yes  Pain : No/denies pain     BMI - recorded: 22.96 Nutritional Status: BMI of 19-24  Normal Nutritional Risks: None Diabetes: No  Lab Results  Component Value Date   HGBA1C 5.4 03/19/2020     How often do you need to have someone help you when you read instructions, pamphlets, or other written materials from your doctor or pharmacy?: 1 - Never  Interpreter Needed?: No  Comments: lives alone Information entered by :: B.Shelah Heatley,LPN   Activities of Daily Living     08/11/2023    1:57 PM  In your present state of health, do you have any difficulty performing the following activities:  Hearing? 0  Vision? 0  Difficulty concentrating or making decisions? 0  Walking or climbing stairs? 0  Dressing or  bathing? 0  Doing errands, shopping? 0  Preparing Food and eating ? N  Using the Toilet? N  In the past six months, have you accidently leaked urine? N  Do you have problems with loss of bowel control? N  Managing your Medications? N  Managing your Finances? N  Housekeeping or managing your Housekeeping? N    Patient Care Team: Donnie Galea, MD as PCP - General (Family Medicine) Amedeo Jupiter, MD as Consulting Physician (Ophthalmology) Jolinda Necessary, MD (Inactive) as Consulting Physician (Gastroenterology) Ronell Coe, DDS as Referring Physician (Dentistry) Auther Bo, RN as Oncology Nurse Navigator Alane Hsu, RN as Oncology Nurse Navigator  I have updated your Care Teams any recent Medical Services you may have received from other providers in the past year.     Assessment:   This is a routine wellness examination for [place].  Hearing/Vision screen Hearing Screening - Comments:: Pt says her hearing is good Vision Screening - Comments:: Pt says her vision is good w/glasses for reading Dr Lasandra Points   Goals Addressed             This Visit's Progress    Increase water intake       08/11/23 I will attempt to drink at least 6-8 glasses of water per day.      Patient Stated   On track    08/11/23- I will continue to take medications as prescribed.        Depression Screen     08/11/2023    1:55 PM 06/08/2023    2:25 PM 06/07/2022    1:49 PM 06/07/2022    1:48 PM 03/31/2022    2:43 PM 03/19/2020   10:00 AM 10/04/2018    4:01 PM  PHQ 2/9 Scores  PHQ - 2 Score 1 0 0 0 0 0 0  PHQ- 9 Score  1   6      Fall Risk     08/11/2023  1:50 PM 06/08/2023    2:25 PM 06/07/2022    1:51 PM 03/31/2022    2:42 PM 03/19/2020    9:59 AM  Fall Risk   Falls in the past year? 0 0 0 0 0  Number falls in past yr: 0 0 0 0 0  Injury with Fall? 0 0 0 0 0  Risk for fall due to : No Fall Risks No Fall Risks No Fall Risks No Fall Risks   Follow up Education provided;Falls  prevention discussed Falls evaluation completed Falls prevention discussed;Falls evaluation completed Falls evaluation completed Falls evaluation completed      Data saved with a previous flowsheet row definition    MEDICARE RISK AT HOME:  Medicare Risk at Home Any stairs in or around the home?: Yes (ramp) If so, are there any without handrails?: Yes Home free of loose throw rugs in walkways, pet beds, electrical cords, etc?: Yes Adequate lighting in your home to reduce risk of falls?: Yes Life alert?: Yes Use of a cane, walker or w/c?: No Grab bars in the bathroom?: Yes Shower chair or bench in shower?: Yes Elevated toilet seat or a handicapped toilet?: Yes  TIMED UP AND GO:  Was the test performed?  No  Cognitive Function: 6CIT completed    09/12/2017    9:27 AM 09/06/2016    1:51 PM 08/24/2015    8:54 AM  MMSE - Mini Mental State Exam  Orientation to time 5 5  5    Orientation to Place 5 5  5    Registration 3 3  3    Attention/ Calculation 0 0  0   Recall 3 2  2    Recall-comments   pt was unable to recall 1 of 3 words   Language- name 2 objects 0 0  0   Language- repeat 1 1 1   Language- follow 3 step command 3 3  3    Language- read & follow direction 0 0  0   Write a sentence 0 0  0   Copy design 0 0  0   Total score 20 19  19       Data saved with a previous flowsheet row definition        08/11/2023    1:58 PM 06/07/2022    1:51 PM  6CIT Screen  What Year? 0 points 0 points  What month? 0 points 0 points  What time? 0 points 0 points  Count back from 20 0 points 0 points  Months in reverse 0 points 0 points  Repeat phrase 0 points 2 points  Total Score 0 points 2 points    Immunizations Immunization History  Administered Date(s) Administered   DT (Pediatric) 06/22/2010   Influenza, High Dose Seasonal PF 11/22/2018, 12/21/2021   Influenza,inj,Quad PF,6+ Mos 12/23/2014, 11/27/2015, 12/15/2016, 11/30/2017   Influenza-Unspecified 12/10/2019, 12/16/2020    Moderna Covid-19 Fall Seasonal Vaccine 43yrs & older 12/21/2021   Moderna Sars-Covid-2 Vaccination 03/21/2019, 04/11/2019   PFIZER(Purple Top)SARS-COV-2 Vaccination 03/21/2019, 04/11/2019, 12/26/2019   Pneumococcal Conjugate-13 08/24/2015   Pneumococcal Polysaccharide-23 09/06/2016   Rsv, Bivalent, Protein Subunit Rsvpref,pf Pattricia Bores) 01/05/2023   Zoster Recombinant(Shingrix) 10/05/2020, 12/07/2020    Screening Tests Health Maintenance  Topic Date Due   DTaP/Tdap/Td (2 - Tdap) 06/21/2020   COVID-19 Vaccine (7 - 2024-25 season) 10/30/2022   Colonoscopy  02/07/2023   MAMMOGRAM  03/02/2023   INFLUENZA VACCINE  09/29/2023   Medicare Annual Wellness (AWV)  08/10/2024   Pneumococcal Vaccine: 50+ Years  Completed  DEXA SCAN  Completed   Zoster Vaccines- Shingrix  Completed   HPV VACCINES  Aged Out   Meningococcal B Vaccine  Aged Out    Health Maintenance  Health Maintenance Due  Topic Date Due   DTaP/Tdap/Td (2 - Tdap) 06/21/2020   COVID-19 Vaccine (7 - 2024-25 season) 10/30/2022   Colonoscopy  02/07/2023   MAMMOGRAM  03/02/2023   Health Maintenance Items Addressed: None needed - Additional Screening:  Vision Screening: Recommended annual ophthalmology exams for early detection of glaucoma and other disorders of the eye. Would you like a referral to an eye doctor? No    Dental Screening: Recommended annual dental exams for proper oral hygiene  Community Resource Referral / Chronic Care Management: CRR required this visit?  No   CCM required this visit?  No   Plan:    I have personally reviewed and noted the following in the patient's chart:   Medical and social history Use of alcohol, tobacco or illicit drugs  Current medications and supplements including opioid prescriptions. Patient is not currently taking opioid prescriptions. Functional ability and status Nutritional status Physical activity Advanced directives List of other physicians Hospitalizations,  surgeries, and ER visits in previous 12 months Vitals Screenings to include cognitive, depression, and falls Referrals and appointments  In addition, I have reviewed and discussed with patient certain preventive protocols, quality metrics, and best practice recommendations. A written personalized care plan for preventive services as well as general preventive health recommendations were provided to patient.   Nerissa Bannister, LPN   06/06/8117   After Visit Summary: (MyChart) Due to this being a telephonic visit, the after visit summary with patients personalized plan was offered to patient via MyChart   Notes: Nothing significant to report at this time.

## 2023-08-23 ENCOUNTER — Other Ambulatory Visit

## 2023-09-04 ENCOUNTER — Telehealth: Payer: Self-pay

## 2023-09-04 NOTE — Telephone Encounter (Signed)
 Copied from CRM [id number]. Topic: Clinical - Medication Question >> Sep 04, 2023 11:29 AM Suzen RAMAN wrote: Reason for CRM: Call back request pertaining to PROLIA  60 MG/ML SOSY injection. Caller would like to know the reason the medication and whether this was prescribed by current provider or another facility. Refill dates per caller: 06/14/23, 11/17/22. Reference# [id number] Direct [phone]

## 2023-09-12 ENCOUNTER — Telehealth: Payer: Self-pay | Admitting: Family Medicine

## 2023-09-12 NOTE — Telephone Encounter (Signed)
 Copied from CRM [id number]. Topic: Clinical - Medication Question >> Sep 12, 2023  5:37 PM Gennette ORN wrote: Reason for CRM: Reason for CRM: Shruti T. [id number] is calling to see where this medication by name of denosumab  (PROLIA ) injection 60 mg she wants to know where it is coming from.

## 2023-09-19 ENCOUNTER — Telehealth: Payer: Self-pay

## 2023-09-19 NOTE — Telephone Encounter (Signed)
 Can you assist me with this?      Copied from CRM [id number]. Topic: Clinical - Medication Question >> Sep 19, 2023 10:13 AM Lavanda D wrote: Reason for CRM: Penne Cotiviti on behalf of [facility] calling in regards to the denosumab  (PROLIA ) injection 60 mg administered on 11/17/22. 06/14/23, she would like to verify the following: was the drug supplied by a free drug program, is it supplied by a specialty pharmacy, or is it already available in the office stock? Also was it a buy and bill or not? Phone #: [phone]

## 2023-09-19 NOTE — Telephone Encounter (Signed)
 Duplicate message. See encounter from 09/19/23.

## 2023-09-20 NOTE — Telephone Encounter (Signed)
 Pt's medication is buy and bill, which means our office supplies the medication for the pt and then we bill the insurance company for it.

## 2023-09-21 NOTE — Telephone Encounter (Signed)
 Returned call to Penne to confirm that medication is buy and bill for both dates of service.

## 2023-10-19 DIAGNOSIS — Z1231 Encounter for screening mammogram for malignant neoplasm of breast: Secondary | ICD-10-CM | POA: Diagnosis not present

## 2023-10-19 LAB — HM MAMMOGRAPHY

## 2023-10-23 ENCOUNTER — Ambulatory Visit: Payer: Self-pay | Admitting: Family Medicine

## 2023-10-29 ENCOUNTER — Other Ambulatory Visit: Payer: Self-pay | Admitting: Family Medicine

## 2023-11-14 ENCOUNTER — Telehealth: Payer: Self-pay

## 2023-11-14 ENCOUNTER — Other Ambulatory Visit (HOSPITAL_COMMUNITY): Payer: Self-pay

## 2023-11-14 NOTE — Telephone Encounter (Signed)
 Amber [name]

## 2023-11-14 NOTE — Telephone Encounter (Signed)
 Prolia  VOB initiated via [email or web address]  Next Prolia  inj DUE: 12/14/23

## 2023-11-14 NOTE — Telephone Encounter (Signed)
 Pt ready for scheduling for PROLIA  on or after : 12/14/23  Option# 1: Buy/Bill (Office supplied medication)  Out-of-pocket cost due at time of clinic visit: $357  Number of injection/visits approved: 2  Primary: AETNA-MEDICARE Prolia  co-insurance: 20% Admin fee co-insurance: 20%  Secondary: --- Prolia  co-insurance:  Admin fee co-insurance:   Medical Benefit Details: Date Benefits were checked: 11/14/23 Deductible: NO/ Coinsurance: 20%/ Admin Fee: 20%  Prior Auth: APPROVED PA# [id number] Expiration Date: 05/12/23-05/11/24   # of doses approved: 2 ----------------------------------------------------------------------- Option# 2- Med Obtained from pharmacy:  Pharmacy benefit: Copay $645.79 (Paid to pharmacy) Admin Fee: 20% (Pay at clinic)  Prior Auth: N/A PA# Expiration Date:   # of doses approved:   If patient wants fill through the pharmacy benefit please send prescription to: [facility], and include estimated need by date in rx notes. Pharmacy will ship medication directly to the office.  Patient NOT eligible for Prolia  Copay Card. Copay Card can make patient's cost as little as $25. Link to apply: [email or web address]://[email or web address]  ** This summary of benefits is an estimation of the patient's out-of-pocket cost. Exact cost may very based on individual plan coverage.

## 2023-11-16 ENCOUNTER — Ambulatory Visit: Payer: Self-pay

## 2023-11-16 NOTE — Telephone Encounter (Signed)
Noted,will see tomorrow. Thanks.

## 2023-11-16 NOTE — Telephone Encounter (Signed)
 FYI Only or Action Required?: FYI only for provider.  Patient was last seen in primary care on 06/08/2023 by Cleatus Arlyss RAMAN, MD.  Called Nurse Triage reporting Dysuria.  Symptoms began several days ago.  Interventions attempted: Rest, hydration, or home remedies.  Symptoms are: unchanged.  Triage Disposition: See Physician Within 24 Hours  Patient/caregiver understands and will follow disposition?: Yes  Copied from CRM [id number]. Topic: Clinical - Red Word Triage >> Nov 16, 2023  3:18 PM Drema MATSU wrote: Red Word that prompted transfer to Nurse Triage: Patient thinks she has cystitis and states that it burns while urinating and painful. Reason for Disposition  All other patients with painful urination  (Exception: [1] EITHER frequency or urgency AND [2] has on-call doctor.)  Answer Assessment - Initial Assessment Questions 1. SEVERITY: How bad is the pain?  (e.g., Scale 1-10; mild, moderate, or severe)     Moderate to severe 2. FREQUENCY: How many times have you had painful urination today?      Numerous amounts today 3. PATTERN: Is pain present every time you urinate or just sometimes?      Every time 4. ONSET: When did the painful urination start?      Sunday 5. FEVER: Do you have a fever? If Yes, ask: What is your temperature, how was it measured, and when did it start?     no 6. PAST UTI: Have you had a urine infection before? If Yes, ask: When was the last time? and What happened that time?      Yes-patient states it has been years 7. CAUSE: What do you think is causing the painful urination?  (e.g., UTI, scratch, Herpes sore)     Possible UTI or cystitis 8. OTHER SYMPTOMS: Do you have any other symptoms? (e.g., blood in urine, flank pain, genital sores, urgency, vaginal discharge)     no  Protocols used: Urination Pain - Female-A-AH

## 2023-11-17 ENCOUNTER — Ambulatory Visit (INDEPENDENT_AMBULATORY_CARE_PROVIDER_SITE_OTHER): Admitting: Family Medicine

## 2023-11-17 ENCOUNTER — Encounter: Payer: Self-pay | Admitting: Family Medicine

## 2023-11-17 VITALS — BP 136/78 | HR 88 | Temp 98.6°F | Ht 62.5 in | Wt 132.2 lb

## 2023-11-17 DIAGNOSIS — R300 Dysuria: Secondary | ICD-10-CM

## 2023-11-17 LAB — POC URINALSYSI DIPSTICK (AUTOMATED)
Bilirubin, UA: NEGATIVE
Blood, UA: NEGATIVE
Glucose, UA: NEGATIVE
Ketones, UA: NEGATIVE
Nitrite, UA: POSITIVE
Protein, UA: POSITIVE — AB
Spec Grav, UA: >=1.03 — AB (ref 1.010–1.025)
Urobilinogen, UA: NEGATIVE U/dL — AB
pH, UA: 6 (ref 5.0–8.0)

## 2023-11-17 MED ORDER — SULFAMETHOXAZOLE-TRIMETHOPRIM 800-160 MG PO TABS: 1.0000 | ORAL_TABLET | Freq: Two times a day (BID) | ORAL | 0 refills | Status: DC

## 2023-11-17 NOTE — Patient Instructions (Signed)
Drink plenty of water and start the antibiotics today.  We'll contact you with your lab report.  Take care.   

## 2023-11-17 NOTE — Progress Notes (Signed)
 dysuria: yes, she had to use AZO last week.  Burning with urination, frequency, voiding small amounts per void.  Nocturia.  No AZO use in the last few days.   duration of symptoms: since last week.  abdominal pain: some lower abd pressure fevers: no but has some chills last week.  No chills now.  back pain: no vomiting: once last week.  U/a d/w pt.   Ucx pending.   I need extra information regarding Prolia  based on recent notes in the chart.  Discussed.  See following phone note.  Meds, vitals, and allergies reviewed.   Per HPI unless specifically indicated in ROS section   GEN: nad, alert and oriented HEENT: ncat NECK: supple, no LA CV: rrr.  PULM: ctab, no inc wob ABD: soft, +bs, suprapubic area mildly tender EXT: no edema SKIN: well perfused.  BACK: no CVA pain

## 2023-11-19 ENCOUNTER — Telehealth: Payer: Self-pay | Admitting: Family Medicine

## 2023-11-19 ENCOUNTER — Ambulatory Visit: Payer: Self-pay | Admitting: Family Medicine

## 2023-11-19 DIAGNOSIS — R300 Dysuria: Secondary | ICD-10-CM | POA: Insufficient documentation

## 2023-11-19 LAB — URINE CULTURE
MICRO NUMBER:: 16992103
SPECIMEN QUALITY:: ADEQUATE

## 2023-11-19 NOTE — Assessment & Plan Note (Signed)
 Likely cystitis.  Okay for outpatient follow-up.  Urinalysis discussed.  Check urine culture.  Start Septra .  Supportive care otherwise.  Update me as needed.  She agrees to plan.

## 2023-11-19 NOTE — Telephone Encounter (Signed)
 I need input about prolia  vs other options based on recent notes.  What is the plan going forward?

## 2023-11-30 NOTE — Telephone Encounter (Signed)
Please talk to me about this patient.  Thanks. 

## 2023-12-03 NOTE — Telephone Encounter (Signed)
 Please check with patient.  She had talked with Zink about potentially starting Fosamax/alendronate if she couldn't continue on Prolia .  Let me know if she wants to get Fosamax started.  Thanks.

## 2023-12-04 NOTE — Telephone Encounter (Signed)
 Left voicemail for patient to return call to office.

## 2023-12-05 NOTE — Progress Notes (Unsigned)
 Brief Telephone Documentation Reason for Call: Re: Prolia  alternatives  Summary of Call:  Prolia  unaffordable for patient. Previous discussion of alternatives and patient elected oral bisphosphonate therapy. At that time, discussed alendronate start ~6 months following last Prolia  injection.   Last Prolia  injection: 06/14/23 Alendronate initiation ~6 months after last Prolia  injection = Mid-October    Labs: Lab Results  Component Value Date   CALCIUM  9.5 06/08/2023   CALCIUM  9.1 11/04/2022   CALCIUM  9.3 03/31/2022   PHOS 3.2 06/23/2008   PHOS 3.8 06/19/2008   PHOS 2.9 06/16/2008   CREATININE 0.90 06/08/2023   CREATININE 0.88 11/04/2022   CREATININE 0.86 03/31/2022   ALBUMIN 4.3 06/08/2023   ALBUMIN 4.4 06/03/2021   ALBUMIN 3.8 02/25/2020   Lab Results  Component Value Date   VD25OH 61.86 06/08/2023   Lab Results  Component Value Date   TSH 2.34 06/08/2023    Assessment/Plan: Osteopenia per last DEXA with FRAX score indicating treatment (Total 13%, Hip 3.7%). PO bisphosphonate clinically appropriate.  Prolia  no longer affordable though we discussed re-visiting cost in the new year if she changes Medicare plans (her medicare may also cover the new biologics which may be a different cost). We reviewed that Prolia  remains a great option in the future if she ever decides to resume it.  For the time being, patient preference remains oral bisphosphonate for cost.   Most recent labs 6 months ago without concerns (06/08/23). Calcium  consistently WNL at 9.5 (albumin normal, corrected calcium  9.7). Vitamin D  last checked 05/2023 WNL, 61.86. TSH 05/2023 WNL. Renal function consistently >>30.   Patient agreeable to lab-only visit next week to confirm calcium /renal function stability prior to alendronate start.  BMP ordered. Lab visit scheduled next week, 10/13 10/15 pending normal BMP, Start Alendronate oral tablet (70 mg weekly)  Reviewed administration instructions with patient and  mailed to home address Take with 6-8 oz of plain water, remain upright for 30 minutes (60 minutes for ibandronate) Take 30 minutes before food/drink/meds Separate 2 hours from calcium , antacids, iron, magnesium Caution with NSAID (GI irritation)  Follow Up: Patient given direct line for further questions/concerns. Reminder set to check in 3 weeks regarding tolerability   Future Appointments  Date Time Provider Department Center  12/06/2023 10:30 AM [facility] PHARMACIST [facility] [address]  06/19/2024 11:45 AM Odean Potts, MD [facility] None  08/14/2024  1:40 PM [facility] ANNUAL WELLNESS VISIT 1 [facility] [address]   Manuelita FABIENE Kobs, PharmD Clinical Pharmacist [facility] [phone]

## 2023-12-06 ENCOUNTER — Other Ambulatory Visit: Admitting: Pharmacist

## 2023-12-06 DIAGNOSIS — M8580 Other specified disorders of bone density and structure, unspecified site: Secondary | ICD-10-CM

## 2023-12-06 NOTE — Telephone Encounter (Signed)
 Patient is agreeable with starting the fosamax. Apparently she spoke with Zink today and she is going to come in and have lab work done

## 2023-12-06 NOTE — Telephone Encounter (Signed)
 Noted. Thanks.

## 2023-12-06 NOTE — Patient Instructions (Signed)
 [name],   It was a pleasure to speak with you today! As we discussed:?   You have been scheduled for a lab-only visit on Monday, 12/11/23. You do not need to be fasting for this lab.   Once your lab results are in, we will give you the okay to start Alendronate oral tablet (70 mg once weekly) Administration instructions Take with 6-8 oz of plain water, remain upright for 30 minutes (60 minutes for ibandronate) Take 30 minutes before food/drink/meds Separate 2 hours from calcium , antacids, iron, magnesium Caution with NSAID (GI irritation)    Please reach out prior to your next scheduled appointment should you have any questions or concerns.  You may reach me via MyChart Message, or you may leave me a voicemail at [phone] and I will get back to you shortly.   Thank you!   Future Appointments  Date Time Provider Department Center  12/11/2023 11:15 AM [facility] LAB [facility] [address]  12/27/2023 10:30 AM [facility] PHARMACIST [facility] [address]  06/19/2024 11:45 AM Odean Potts, MD [facility] None  08/14/2024  1:40 PM [facility] ANNUAL WELLNESS VISIT 1 [facility] [address]    Amber [name], PharmD Clinical Pharmacist [facility] [phone]

## 2023-12-11 ENCOUNTER — Other Ambulatory Visit: Payer: Self-pay | Admitting: Pharmacist

## 2023-12-11 ENCOUNTER — Other Ambulatory Visit (INDEPENDENT_AMBULATORY_CARE_PROVIDER_SITE_OTHER)

## 2023-12-11 DIAGNOSIS — M8580 Other specified disorders of bone density and structure, unspecified site: Secondary | ICD-10-CM | POA: Diagnosis not present

## 2023-12-11 LAB — BASIC METABOLIC PANEL WITH GFR
BUN: 14 mg/dL (ref 6–23)
CO2: 34 meq/L — ABNORMAL HIGH (ref 19–32)
Calcium: 9.7 mg/dL (ref 8.4–10.5)
Chloride: 99 meq/L (ref 96–112)
Creatinine, Ser: 0.88 mg/dL (ref 0.40–1.20)
GFR: 58.86 mL/min — ABNORMAL LOW (ref >60.00)
Glucose, Bld: 114 mg/dL — ABNORMAL HIGH (ref 70–99)
Potassium: 3.4 meq/L — ABNORMAL LOW (ref 3.5–5.1)
Sodium: 142 meq/L (ref 135–145)

## 2023-12-11 MED ORDER — ALENDRONATE SODIUM 70 MG PO TABS: 70.0000 mg | ORAL_TABLET | ORAL | 5 refills | Status: AC

## 2023-12-11 NOTE — Progress Notes (Signed)
 Agree. Thanks

## 2023-12-12 ENCOUNTER — Telehealth: Payer: Self-pay | Admitting: Pharmacist

## 2023-12-12 DIAGNOSIS — M8580 Other specified disorders of bone density and structure, unspecified site: Secondary | ICD-10-CM

## 2023-12-12 NOTE — Progress Notes (Signed)
 Brief Telephone Documentation Reason for Call: Lab results  Summary of Call: Called patient to review lab results from yesterday. Kidney function and calcium  level are stable from previous, okay to start alendronate this week. Reviewed administration instructions again with patient including taking without food/drink first thing in the morning with a glass of water.   Only new abnormality on labs is slightly low potassium. Historically, potassium has been within normal range. Not on diuretic. Discussed with patient. She is still taking a daily multivitamin. We reviewed dietary sources of potassium including bananas, leafy greens such as spinach, broccoli, potatoes w skin, beans (kidney, pinto, soy, etc.). Forwarded to PCP for further assessment/recommendation(s).   Follow Up: Patient given direct line for further questions/concerns. ~2 week check-in s/p alendronate start   Amber [name], PharmD Clinical Pharmacist [facility] [phone]

## 2023-12-27 ENCOUNTER — Other Ambulatory Visit: Admitting: Pharmacist

## 2023-12-27 DIAGNOSIS — M8580 Other specified disorders of bone density and structure, unspecified site: Secondary | ICD-10-CM

## 2023-12-27 NOTE — Progress Notes (Signed)
 Brief Telephone Documentation Reason for Call: New medication - follow up   Summary of Call: Called patient in regard to new alendronate start 10/13.  Patient reports doing well since last visit.   Reports she did not start alendronate though picked it up from the pharmacy.   Notes she saw possible side effect of diarrhea and hesitated because her cancer pill causes diarrhea. Also notes waiting 30 minutes to eat breakfast would be hard for her on days she has to go somewhere (e/g/ church).    Assessment/Plan: Discussed incidence of diarrhea in clinical trials. Patient verbalizes understanding, did not realize it was so low. Patient notes the only day she leaves the house early is Sunday for church. Discussed picking any other day of the week and taking first thing in the morning. By the time she brushes teeth/gets ready, likely that 30-60 minutes has passed.  Start alendronate 70 mg weekly (patient states she will start next Monday).   Future Appointments  Date Time Provider Department Center  12/27/2023 10:30 AM [facility] PHARMACIST [facility] [address]  06/19/2024 11:45 AM Odean Potts, MD [facility] None  08/14/2024  1:40 PM [facility] ANNUAL WELLNESS VISIT 1 [facility] [address]

## 2024-01-10 ENCOUNTER — Other Ambulatory Visit: Admitting: Pharmacist

## 2024-01-10 DIAGNOSIS — M8580 Other specified disorders of bone density and structure, unspecified site: Secondary | ICD-10-CM

## 2024-01-10 NOTE — Progress Notes (Signed)
 Update: MUTYH c.925C>T VUS reclassified to likely benign. Report date is 01/08/2024.

## 2024-01-10 NOTE — Progress Notes (Signed)
 Brief Telephone Documentation Reason for Call: New medication start, follow up  Summary of Call: Called patient to ensure no questions/concerns after recent alendronate start.   As reported at last visit, patient had delayed starting alendronate due to getting over an acute respiratory illness. Confirms she is on the med and feeling better.   First dose of alendronate was on Saturday without issues. No concerns of side effects with first dose. Reviewed weekly dosing and administration instructions again briefly. Patient has no further questions/concerns at this time.   No additional monitoring/labs needed outside of regular maintenance labs.   Follow Up: Patient given direct line for further questions/concerns.  Future Appointments  Date Time Provider Department Center  06/19/2024 11:45 AM Odean Potts, MD [facility] None  08/14/2024  1:40 PM [facility] ANNUAL WELLNESS VISIT 1 [facility] [address]

## 2024-01-29 ENCOUNTER — Ambulatory Visit: Payer: Self-pay

## 2024-01-29 NOTE — Telephone Encounter (Signed)
 FYI Only or Action Required?: FYI only for provider: appointment scheduled on 01/30/24.  Patient was last seen in primary care on 11/17/2023 by Cleatus Arlyss RAMAN, MD.  Called Nurse Triage reporting Dysuria.  Symptoms began several days ago.  Interventions attempted: Rest, hydration, or home remedies.  Symptoms are: gradually worsening.  Triage Disposition: See Physician Within 24 Hours  Patient/caregiver understands and will follow disposition?: Yes   Copied from CRM #[id number]. Topic: Clinical - Red Word Triage >> Jan 29, 2024  8:49 AM Mercedes MATSU wrote: Red Word that prompted transfer to Nurse Triage: Painful urination, patient states she may have a UTI and is requesting antibiotics, She said she has had these symptoms before. Reason for Disposition  Age > 50 years  Answer Assessment - Initial Assessment Questions 1. SEVERITY: How bad is the pain?  (e.g., Scale 1-10; mild, moderate, or severe)     burning 2. FREQUENCY: How many times have you had painful urination today?      Increased every 30 minutes 3. PATTERN: Is pain present every time you urinate or just sometimes?      each 4. ONSET: When did the painful urination start?      Friday  5. FEVER: Do you have a fever? If Yes, ask: What is your temperature, how was it measured, and when did it start?     denies 6. PAST UTI: Have you had a urine infection before? If Yes, ask: When was the last time? and What happened that time?      Yes-abx in September  7. CAUSE: What do you think is causing the painful urination?  (e.g., UTI, scratch, Herpes sore)     UTI 8. OTHER SYMPTOMS: Do you have any other symptoms? (e.g., blood in urine, flank pain, genital sores, urgency, vaginal discharge)     Denies  9. PREGNANCY: Is there any chance you are pregnant? When was your last menstrual period?  Protocols used: Urination Pain - Female-A-AH

## 2024-01-29 NOTE — Telephone Encounter (Signed)
Noted. Will see tomorrow.  Thanks.  

## 2024-01-30 ENCOUNTER — Ambulatory Visit: Payer: Self-pay

## 2024-01-30 ENCOUNTER — Ambulatory Visit: Admitting: Family Medicine

## 2024-01-30 ENCOUNTER — Encounter: Payer: Self-pay | Admitting: Family Medicine

## 2024-01-30 VITALS — BP 130/88 | HR 88 | Temp 98.1°F | Ht 62.5 in | Wt 130.1 lb

## 2024-01-30 DIAGNOSIS — R300 Dysuria: Secondary | ICD-10-CM | POA: Diagnosis not present

## 2024-01-30 LAB — POC URINALSYSI DIPSTICK (AUTOMATED)
Bilirubin, UA: NEGATIVE
Blood, UA: NEGATIVE
Glucose, UA: NEGATIVE
Ketones, UA: NEGATIVE
Nitrite, UA: NEGATIVE
Protein, UA: NEGATIVE
Spec Grav, UA: 1.02 (ref 1.010–1.025)
Urobilinogen, UA: 0.2 U/dL
pH, UA: 6 (ref 5.0–8.0)

## 2024-01-30 MED ORDER — SULFAMETHOXAZOLE-TRIMETHOPRIM 800-160 MG PO TABS: 1.0000 | ORAL_TABLET | Freq: Two times a day (BID) | ORAL | 0 refills | Status: DC

## 2024-01-30 NOTE — Progress Notes (Signed)
 dysuria: yes, burning, urgency, incomplete voiding.   duration of symptoms: 5 days.  abdominal pain: no fevers: no back pain:some R lower back pain.  Not CVA pain.  She has some back pain at baseline.   vomiting: no U/a d/w pt.   Meds, vitals, and allergies reviewed.   Per HPI unless specifically indicated in ROS section   GEN: nad, alert and oriented HEENT: ncat NECK: supple CV: rrr.  PULM: ctab, no inc wob ABD: soft, +bs, suprapubic area not tender EXT: no edema SKIN: well perfused.  BACK: no CVA pain

## 2024-01-30 NOTE — Telephone Encounter (Signed)
 Called patient reviewed all information and repeated back to me. Will call if any questions.  ? ?

## 2024-01-30 NOTE — Telephone Encounter (Signed)
 Please have her stop septra .  Can use OTC hydrocortisone if needed for itching.  If lip or tongue swelling, then needs to go to ER.    I listed septra  allergy.   I'll await her ucx results.

## 2024-01-30 NOTE — Addendum Note (Signed)
 Addended by: CLEATUS ARLYSS RAMAN on: 01/30/2024 04:33 PM   Modules accepted: Orders

## 2024-01-30 NOTE — Telephone Encounter (Signed)
  FYI Only or Action Required?: Action required by provider: clinical question for provider.  Patient was last seen in primary care on 01/30/2024 by Cleatus Arlyss RAMAN, MD.  Called Nurse Triage reporting Medication Reaction.  Symptoms began today.  Interventions attempted: Rest, hydration, or home remedies.  Symptoms are: stable.  Triage Disposition: Home Care  Patient/caregiver understands and will follow disposition?: Yes  Reason for Disposition  Mild localized rash  Answer Assessment - Initial Assessment Questions Patient took sulfamethoxazole -trimethoprim  (BACTRIM  DS) 800-160 MG tablet today at 12pm and an hour later started itching excessively  on arms and hands. Patient reports taking this medication before and has not been bothered by it but has not done or taken anything else different. Advised patient not to take second dose today until PCP advises next steps. Please advise.  1. APPEARANCE of RASH: What does the rash look like? (e.g., blisters, dry flaky skin, red spots, redness, sores)     Palm of hand is red  2. LOCATION: Where is the rash located?      Bilateral arms and hands  3. NUMBER: How many spots are there?      Denies  4. SIZE: How big are the spots? (e.g., inches, cm; or compare to size of pinhead, tip of pen, eraser, pea)      N/a  5. ONSET: When did the rash start?      Today  6. ITCHING: Does the rash itch? If Yes, ask: How bad is the itch?  (Scale 0-10; or none, mild, moderate, severe)     Severe itching  7. PAIN: Does the rash hurt? If Yes, ask: How bad is the pain?  (Scale 0-10; or none, mild, moderate, severe)     Denies  8. OTHER SYMPTOMS: Do you have any other symptoms? (e.g., fever)     denies  Protocols used: Rash or Redness - Localized-A-AH  Message from Fernley S sent at 01/30/2024  2:13 PM EST  Reason for Triage: itching up arms since taking new meds prescribed today (SULFAMETHOXAZOLE ) , wondering if she should  take another one at night. [phone]

## 2024-01-30 NOTE — Patient Instructions (Addendum)
Drink plenty of water and start the antibiotics today.  We'll contact you with your lab report.  Take care.   

## 2024-01-30 NOTE — Assessment & Plan Note (Signed)
 Likely cystitis.  Okay for outpatient follow-up.  Urinalysis discussed.  Check urine culture.  Start Septra .  Supportive care otherwise.  Update me as needed.  She agrees to plan.

## 2024-02-02 ENCOUNTER — Ambulatory Visit: Payer: Self-pay | Admitting: Family Medicine

## 2024-02-02 ENCOUNTER — Other Ambulatory Visit: Payer: Self-pay | Admitting: Family Medicine

## 2024-02-02 LAB — URINE CULTURE
MICRO NUMBER:: 17302943
SPECIMEN QUALITY:: ADEQUATE

## 2024-02-02 MED ORDER — AMOXICILLIN 875 MG PO TABS: 875.0000 mg | ORAL_TABLET | Freq: Two times a day (BID) | ORAL | 0 refills | Status: AC

## 2024-03-10 ENCOUNTER — Other Ambulatory Visit: Payer: Self-pay | Admitting: Hematology and Oncology

## 2024-06-19 ENCOUNTER — Ambulatory Visit: Admitting: Hematology and Oncology

## 2024-08-14 ENCOUNTER — Ambulatory Visit
